# Patient Record
Sex: Female | Born: 1950
Health system: Southern US, Community
[De-identification: ages and names within clinical notes are randomized; demographics above are authoritative.]

## PROBLEM LIST (undated history)

## (undated) DIAGNOSIS — Z923 Personal history of irradiation: Secondary | ICD-10-CM

## (undated) DIAGNOSIS — R112 Nausea with vomiting, unspecified: Secondary | ICD-10-CM

## (undated) DIAGNOSIS — F32A Depression, unspecified: Secondary | ICD-10-CM

## (undated) DIAGNOSIS — C801 Malignant (primary) neoplasm, unspecified: Secondary | ICD-10-CM

## (undated) DIAGNOSIS — R519 Headache, unspecified: Secondary | ICD-10-CM

## (undated) DIAGNOSIS — Z9889 Other specified postprocedural states: Secondary | ICD-10-CM

## (undated) DIAGNOSIS — C50919 Malignant neoplasm of unspecified site of unspecified female breast: Secondary | ICD-10-CM

## (undated) DIAGNOSIS — F419 Anxiety disorder, unspecified: Secondary | ICD-10-CM

## (undated) DIAGNOSIS — R51 Headache: Secondary | ICD-10-CM

## (undated) DIAGNOSIS — E7800 Pure hypercholesterolemia, unspecified: Secondary | ICD-10-CM

## (undated) DIAGNOSIS — F329 Major depressive disorder, single episode, unspecified: Secondary | ICD-10-CM

## (undated) DIAGNOSIS — M1990 Unspecified osteoarthritis, unspecified site: Secondary | ICD-10-CM

## (undated) DIAGNOSIS — T4145XA Adverse effect of unspecified anesthetic, initial encounter: Secondary | ICD-10-CM

## (undated) DIAGNOSIS — T8859XA Other complications of anesthesia, initial encounter: Secondary | ICD-10-CM

## (undated) HISTORY — PX: TONSILLECTOMY AND ADENOIDECTOMY: SHX28

## (undated) HISTORY — DX: Anxiety disorder, unspecified: F41.9

## (undated) HISTORY — PX: MOUTH SURGERY: SHX715

## (undated) HISTORY — DX: Malignant (primary) neoplasm, unspecified: C80.1

## (undated) HISTORY — DX: Depression, unspecified: F32.A

## (undated) HISTORY — PX: ABDOMINAL HYSTERECTOMY: SHX81

## (undated) HISTORY — DX: Major depressive disorder, single episode, unspecified: F32.9

## (undated) HISTORY — DX: Personal history of irradiation: Z92.3

---

## 1996-12-08 HISTORY — PX: TOTAL VAGINAL HYSTERECTOMY: SHX2548

## 1999-11-06 ENCOUNTER — Encounter: Admission: RE | Admit: 1999-11-06 | Discharge: 1999-11-06 | Payer: Self-pay | Admitting: Obstetrics and Gynecology

## 1999-11-06 ENCOUNTER — Encounter: Payer: Self-pay | Admitting: Obstetrics and Gynecology

## 2000-01-26 ENCOUNTER — Other Ambulatory Visit: Admission: RE | Admit: 2000-01-26 | Discharge: 2000-01-26 | Payer: Self-pay | Admitting: Obstetrics and Gynecology

## 2000-11-17 ENCOUNTER — Encounter: Payer: Self-pay | Admitting: Family Medicine

## 2000-11-17 ENCOUNTER — Encounter: Admission: RE | Admit: 2000-11-17 | Discharge: 2000-11-17 | Payer: Self-pay | Admitting: Family Medicine

## 2001-11-21 ENCOUNTER — Encounter: Admission: RE | Admit: 2001-11-21 | Discharge: 2001-11-21 | Payer: Self-pay | Admitting: Obstetrics and Gynecology

## 2001-11-21 ENCOUNTER — Encounter: Payer: Self-pay | Admitting: Obstetrics and Gynecology

## 2002-02-05 ENCOUNTER — Other Ambulatory Visit: Admission: RE | Admit: 2002-02-05 | Discharge: 2002-02-05 | Payer: Self-pay | Admitting: Obstetrics and Gynecology

## 2002-02-16 ENCOUNTER — Ambulatory Visit (HOSPITAL_COMMUNITY): Admission: RE | Admit: 2002-02-16 | Discharge: 2002-02-16 | Payer: Self-pay | Admitting: Gastroenterology

## 2002-11-23 ENCOUNTER — Encounter: Payer: Self-pay | Admitting: Obstetrics and Gynecology

## 2002-11-23 ENCOUNTER — Encounter: Admission: RE | Admit: 2002-11-23 | Discharge: 2002-11-23 | Payer: Self-pay | Admitting: Obstetrics and Gynecology

## 2004-12-18 ENCOUNTER — Encounter: Admission: RE | Admit: 2004-12-18 | Discharge: 2004-12-18 | Payer: Self-pay | Admitting: Obstetrics and Gynecology

## 2005-12-24 ENCOUNTER — Encounter: Admission: RE | Admit: 2005-12-24 | Discharge: 2005-12-24 | Payer: Self-pay | Admitting: Obstetrics and Gynecology

## 2006-08-10 ENCOUNTER — Encounter: Admission: RE | Admit: 2006-08-10 | Discharge: 2006-08-10 | Payer: Self-pay | Admitting: Obstetrics and Gynecology

## 2007-01-20 ENCOUNTER — Encounter: Admission: RE | Admit: 2007-01-20 | Discharge: 2007-01-20 | Payer: Self-pay | Admitting: Obstetrics and Gynecology

## 2008-01-26 ENCOUNTER — Encounter: Admission: RE | Admit: 2008-01-26 | Discharge: 2008-01-26 | Payer: Self-pay | Admitting: Obstetrics and Gynecology

## 2009-01-31 ENCOUNTER — Encounter: Admission: RE | Admit: 2009-01-31 | Discharge: 2009-01-31 | Payer: Self-pay | Admitting: Obstetrics & Gynecology

## 2010-02-06 ENCOUNTER — Encounter: Admission: RE | Admit: 2010-02-06 | Discharge: 2010-02-06 | Payer: Self-pay | Admitting: Obstetrics and Gynecology

## 2010-09-25 NOTE — Op Note (Signed)
[name], Dawn West                          ACCOUNT NO.:  [id number]   MEDICAL RECORD NO.:  [id number]                   PATIENT TYPE:  AMB   LOCATION:  ENDO                                 FACILITY:  [facility]   PHYSICIAN:  Anselmo Rod, M.D.               DATE OF BIRTH:  03-13-1951   DATE OF PROCEDURE:  02/16/2002  DATE OF DISCHARGE:                                 OPERATIVE REPORT   PROCEDURE PERFORMED:  Screening colonoscopy.   ENDOSCOPIST:  Anselmo Rod, M.D.   INSTRUMENT USED:  Olympus video colonoscopy (adjustable pediatric scope).   INDICATIONS FOR PROCEDURE:  60 year old white female underwent screening  colonoscopy to rule out colonic polyps, masses, hemorrhoids, etc.   PREPROCEDURE PREPARATION:  Informed consent was procured from the patient.  The patient fasted for eight hours prior to the procedure and prepped with a  bottle of magnesium citrate and a gallon of NuLytely the night prior to the  procedure.   PREPROCEDURE PHYSICAL:  VITAL SIGNS:  The patient had stable vital signs.  NECK:  Supple.  CHEST:  Clear to auscultation.  HEART:  S1, S2 is regular.  ABDOMEN:  Abdomen is soft with normal bowel sounds.   DESCRIPTION OF PROCEDURE:  The patient was placed in the left lateral  decubitus position, sedated with 60 mg of Demerol and 6 mg of Versed  intravenously.  Once the patient was adequate sedated and maintained on low-  flow oxygen and continuous cardiac monitoring, the Olympus video colonoscope  was advanced from the rectum to the cecum without difficulty.  In fact, all  of the mucosa appeared healthy with a normal vascular pattern.  No mass.  Except for a few early left-sided diverticula, no other abnormalities were  seen.  The procedure was complete up to the cecum.  The appendicular orifice  and ileocecal valve were clearly visualized and photographed.  No masses or  polyps were present.   IMPRESSION:  Normal colonoscopy except for a few early  left-sided  diverticula.   RECOMMENDATIONS:  1. A high-fiber diet has been discussed with the patient in great detail.     Brochures on diverticulosis have been handed to her for further     education.  2.     Repeat colorectal cancer screening will be done in the next ten years unless     the patient develops any abnormal symptoms.  3. Outpatient followup on a p.r.n. basis.                                               Anselmo Rod, M.D.    JNM/MEDQ  D:  02/16/2002  T:  02/17/2002  Job:  [id number]   cc:   Aram Beecham P. Romine, M.D.  [address]  [place]  [place] [address]  Fax: [phone]   Doris Cheadle. Foy Guadalajara, M.D.  [address] [place]  [place] [address]  Fax: [phone]

## 2011-02-03 ENCOUNTER — Other Ambulatory Visit: Payer: Self-pay | Admitting: Obstetrics and Gynecology

## 2011-02-03 DIAGNOSIS — Z1231 Encounter for screening mammogram for malignant neoplasm of breast: Secondary | ICD-10-CM

## 2011-02-19 ENCOUNTER — Ambulatory Visit
Admission: RE | Admit: 2011-02-19 | Discharge: 2011-02-19 | Disposition: A | Discharge status: Home / Self Care | Payer: BC Managed Care – PPO | Source: Ambulatory Visit | Attending: Obstetrics and Gynecology | Admitting: Obstetrics and Gynecology

## 2011-02-19 DIAGNOSIS — Z1231 Encounter for screening mammogram for malignant neoplasm of breast: Secondary | ICD-10-CM

## 2011-05-11 HISTORY — PX: SKIN CANCER EXCISION: SHX779

## 2011-12-01 DIAGNOSIS — C801 Malignant (primary) neoplasm, unspecified: Secondary | ICD-10-CM

## 2011-12-01 HISTORY — DX: Malignant (primary) neoplasm, unspecified: C80.1

## 2012-01-21 ENCOUNTER — Other Ambulatory Visit: Payer: Self-pay | Admitting: Obstetrics and Gynecology

## 2012-01-21 DIAGNOSIS — Z1231 Encounter for screening mammogram for malignant neoplasm of breast: Secondary | ICD-10-CM

## 2012-02-25 ENCOUNTER — Ambulatory Visit
Admission: RE | Admit: 2012-02-25 | Discharge: 2012-02-25 | Disposition: A | Discharge status: Home / Self Care | Payer: BC Managed Care – PPO | Source: Ambulatory Visit | Attending: Obstetrics and Gynecology | Admitting: Obstetrics and Gynecology

## 2012-02-25 DIAGNOSIS — Z1231 Encounter for screening mammogram for malignant neoplasm of breast: Secondary | ICD-10-CM

## 2012-03-31 LAB — HM COLONOSCOPY

## 2013-02-12 ENCOUNTER — Other Ambulatory Visit: Payer: Self-pay

## 2013-02-12 DIAGNOSIS — Z1231 Encounter for screening mammogram for malignant neoplasm of breast: Secondary | ICD-10-CM

## 2013-03-02 ENCOUNTER — Ambulatory Visit
Admission: RE | Admit: 2013-03-02 | Discharge: 2013-03-02 | Disposition: A | Discharge status: Home / Self Care | Payer: BC Managed Care – PPO | Source: Ambulatory Visit

## 2013-03-02 DIAGNOSIS — Z1231 Encounter for screening mammogram for malignant neoplasm of breast: Secondary | ICD-10-CM

## 2013-05-09 ENCOUNTER — Other Ambulatory Visit: Payer: Self-pay | Admitting: Nurse Practitioner

## 2013-05-09 NOTE — Telephone Encounter (Signed)
[facility]Scribe request for refill on SERTRALINE Last filled - 05/08/12 X 1 YEAR Last AEX - 05/08/12 Next AEX - 06/20/13 RX sent until AEX.

## 2013-06-14 ENCOUNTER — Other Ambulatory Visit: Payer: Self-pay | Admitting: *Deleted

## 2013-06-14 NOTE — Telephone Encounter (Signed)
Incoming fax from [facility] requesting Vitamin D 50,000.  Last AEX 05/08/2012 Last refill 05/08/2012 x 1 year Last Vitamin D check 05/08/2012 - Normal  Next appt 06/20/2013  Rx Denied. - Dawn West will probably check Vitamin D at next appt and will decide if pt needs a refill then.

## 2013-06-19 ENCOUNTER — Encounter: Payer: Self-pay | Admitting: Nurse Practitioner

## 2013-06-20 ENCOUNTER — Ambulatory Visit (INDEPENDENT_AMBULATORY_CARE_PROVIDER_SITE_OTHER): Payer: BC Managed Care – PPO | Admitting: Nurse Practitioner

## 2013-06-20 ENCOUNTER — Other Ambulatory Visit: Payer: Self-pay | Admitting: Nurse Practitioner

## 2013-06-20 ENCOUNTER — Encounter: Payer: Self-pay | Admitting: Nurse Practitioner

## 2013-06-20 ENCOUNTER — Telehealth: Payer: Self-pay | Admitting: Nurse Practitioner

## 2013-06-20 ENCOUNTER — Ambulatory Visit
Admission: RE | Admit: 2013-06-20 | Discharge: 2013-06-20 | Disposition: A | Discharge status: Home / Self Care | Payer: BC Managed Care – PPO | Source: Ambulatory Visit | Attending: Nurse Practitioner | Admitting: Nurse Practitioner

## 2013-06-20 VITALS — BP 140/86 | HR 72 | Ht 62.75 in | Wt 152.0 lb

## 2013-06-20 DIAGNOSIS — M7120 Synovial cyst of popliteal space [Baker], unspecified knee: Secondary | ICD-10-CM

## 2013-06-20 DIAGNOSIS — M79661 Pain in right lower leg: Secondary | ICD-10-CM

## 2013-06-20 DIAGNOSIS — E559 Vitamin D deficiency, unspecified: Secondary | ICD-10-CM

## 2013-06-20 DIAGNOSIS — M8580 Other specified disorders of bone density and structure, unspecified site: Secondary | ICD-10-CM

## 2013-06-20 DIAGNOSIS — M949 Disorder of cartilage, unspecified: Secondary | ICD-10-CM

## 2013-06-20 DIAGNOSIS — Z01419 Encounter for gynecological examination (general) (routine) without abnormal findings: Secondary | ICD-10-CM

## 2013-06-20 DIAGNOSIS — M899 Disorder of bone, unspecified: Secondary | ICD-10-CM

## 2013-06-20 DIAGNOSIS — Z0000 Encounter for general adult medical examination without abnormal findings: Secondary | ICD-10-CM

## 2013-06-20 DIAGNOSIS — M79609 Pain in unspecified limb: Secondary | ICD-10-CM

## 2013-06-20 LAB — POCT URINALYSIS DIPSTICK
BILIRUBIN UA: NEGATIVE
GLUCOSE UA: NEGATIVE
Ketones, UA: NEGATIVE
LEUKOCYTES UA: NEGATIVE
NITRITE UA: NEGATIVE
Protein, UA: NEGATIVE
RBC UA: NEGATIVE
UROBILINOGEN UA: NEGATIVE
pH, UA: 6

## 2013-06-20 LAB — HEMOGLOBIN, FINGERSTICK: HEMOGLOBIN, FINGERSTICK: 14.2 g/dL (ref 12.0–16.0)

## 2013-06-20 MED ORDER — ESTRADIOL 0.5 MG PO TABS: 0.5000 mg | ORAL_TABLET | Freq: Every day | ORAL | Status: DC

## 2013-06-20 MED ORDER — VITAMIN D (ERGOCALCIFEROL) 1.25 MG (50000 UNIT) PO CAPS: 50000.0000 [IU] | ORAL_CAPSULE | ORAL | Status: DC

## 2013-06-20 MED ORDER — SERTRALINE HCL 100 MG PO TABS: ORAL_TABLET | ORAL | Status: DC

## 2013-06-20 NOTE — Progress Notes (Signed)
Patient scheduled for 1500 RLE U/S to r/o DVT at [facility] for today at [address].

## 2013-06-20 NOTE — Progress Notes (Signed)
Patient ID: Dawn West, female   DOB: 1951/01/19, 63 y.o.   MRN: [id number] 63 y.o. [id number] Married Caucasian Fe here for annual exam. Her main concern is with right leg pain since January.  No history of trauma, but she thinks is related to going up and down stars to put away Christmas decoration. Yesterday was quite painful after driving her neighbor to the store, movies, and out to eat.  No swelling, redness, or warmth.  At times the right knee is also painful. Husband will be having surgery for penile implant this month.  Since his diabetes he is having a lot of difficulty with ED.  No LMP recorded. Patient is postmenopausal.          Sexually active: yes  The current method of family planning is post menopausal status.    Exercising: yes  walking Smoker:  no  Health Maintenance: Pap:  03/28/09, WNL (TVH) MMG:  03/02/13, Bi-Rads 1: Negative Colonoscopy:  03/31/12, polyp, diverticula, repeat 5 years BMD:  01/2010, -0.9/-1.4 TDaP:  2012 Labs:  HB:  14.2  Urine:  Negative, pH 6.0   reports that she has never smoked. She does not have any smokeless tobacco history on file.  Past Medical History  Diagnosis Date  . Cancer 12/01/11    basal cell skin ca Left upper arm    Past Surgical History  Procedure Laterality Date  . Skin cancer excision Left 2013    upper arm, basal cell  . Tonsillectomy and adenoidectomy    . Mouth surgery    . Total vaginal hysterectomy      menorrhagia, fibroids    Current Outpatient Prescriptions  Medication Sig Dispense Refill  . aspirin EC 81 MG tablet Take 81 mg by mouth daily.      . Calcium Carb-Cholecalciferol (CALCIUM 600 + D) 600-200 MG-UNIT TABS Take 1 tablet by mouth daily.      . cetirizine (ZYRTEC) 10 MG tablet Take 10 mg by mouth daily.      Marland Kitchen estradiol (ESTRACE) 0.5 MG tablet Take 0.5 mg by mouth daily.      . Magnesium 250 MG TABS Take by mouth.      . Multiple Vitamin (MULTIVITAMIN) tablet Take 1 tablet by mouth daily.      . Omega-3  Fatty Acids (FISH OIL PO) Take by mouth.      . pravastatin (PRAVACHOL) 40 MG tablet Take 40 mg by mouth daily.      . sertraline (ZOLOFT) 100 MG tablet Take one tablet by mouth one time daily  90 tablet  3  . Vitamin D, Ergocalciferol, (DRISDOL) 50000 UNITS CAPS capsule Take 1 capsule (50,000 Units total) by mouth every 14 (fourteen) days.  30 capsule  3  . vitamin E 400 UNIT capsule Take 400 Units by mouth daily.      . fluticasone (FLONASE) 50 MCG/ACT nasal spray as needed.       No current facility-administered medications for this visit.    Family History  Problem Relation Age of Onset  . Hypertension Mother   . Heart failure Mother   . Diabetes Mother   . Stroke Mother   . COPD Mother   . Hyperlipidemia Mother   . Hypertension Father   . Heart failure Father   . Hypertension Brother   . Hypertension Sister   . Hyperlipidemia Sister   . Hypertension Brother   . Hypertension Brother   . Diabetes Brother     ROS:  Pertinent  items are noted in HPI.  Otherwise, a comprehensive ROS was negative.  Exam:   BP 140/86  Pulse 72  Ht 5' 2.75" (1.594 m)  Wt 152 lb (68.947 kg)  BMI 27.14 kg/m2 Height: 5' 2.75" (159.4 cm)  Ht Readings from Last 3 Encounters:  06/20/13 5' 2.75" (1.594 m)    General appearance: alert, cooperative and appears stated age Head: Normocephalic, without obvious abnormality, atraumatic Neck: no adenopathy, supple, symmetrical, trachea midline and thyroid normal to inspection and palpation Lungs: clear to auscultation bilaterally Breasts: normal appearance, no masses or tenderness Heart: regular rate and rhythm Abdomen: soft, non-tender; no masses,  no organomegaly Extremities: extremities normal, atraumatic, no cyanosis or edema Skin: Skin color, texture, turgor normal. No rashes or lesions Lymph nodes: Cervical, supraclavicular, and axillary nodes normal. No abnormal inguinal nodes palpated Neurologic: Grossly normal   Pelvic: External genitalia:   no lesions              Urethra:  normal appearing urethra with no masses, tenderness or lesions              Bartholin's and Skene's: normal                 Vagina: normal appearing vagina with normal color and discharge, no lesions              Cervix: absent              Pap taken: no Bimanual Exam:  Uterus:  uterus absent              Adnexa: no mass, fullness, tenderness               Rectovaginal: Confirms               Anus:  normal sphincter tone, no lesions  A:  Well Woman with normal exam  S/P TVH secondary to fibroids 8/98 on ERT  Right leg pain - R/O DVT since she is on ERT  Atrophic vaginitis   Vit D deficiency  osteopenia   P:   Pap smear as per guidelines   Mammogram is due 02/2014  Will repeat BMD at that time - order is placed  Will get a doppler of right leg and follow - no refill of ERT until we make sure no DVT  Refill of ERT was placed after results of doppler studies - see note below.    She  Is counseled about ERT and potential risk including DVT, CVA, cancer, etc.  Refill on Vit D and will follow with labs    Counseled on breast self exam, mammography screening, adequate intake of calcium and vitamin D, diet and exercise return annually or prn  An After Visit Summary was printed and given to the patient.  5:00 pm Patient  had doppler test of right lower extremity and has no DVT but she does have a Bakers Cyst behind the right knee.  She will be referred to Ortho.

## 2013-06-20 NOTE — Telephone Encounter (Signed)
Spoke with Milford Cage, FNP, okay to give results and referral to [facility].

## 2013-06-20 NOTE — Telephone Encounter (Signed)
[facility] calling with results of rt lower extremity venous doppler. No evidence of lower extremity dvt and probable small baker's cyst.

## 2013-06-20 NOTE — Patient Instructions (Signed)

## 2013-06-21 LAB — VITAMIN D 25 HYDROXY (VIT D DEFICIENCY, FRACTURES): Vit D, 25-Hydroxy: 54 ng/mL (ref 30–89)

## 2013-06-21 NOTE — Telephone Encounter (Signed)
Spoke with patient. Advised has ortho appointment with Dr. Drema Dallas at [facility] for 2/18 at 0830 and given address. Agreeable to time/date/location. Will fax records to Dr. Drema Dallas at [phone].  Lab results given as well. Has rx vitamin D and will continue as ordered.

## 2013-06-21 NOTE — Telephone Encounter (Signed)
Message copied by Michele Mcalpine on Thu Jun 21, 2013  9:26 AM ------      Message from: Kem Boroughs R      Created: Thu Jun 21, 2013  8:37 AM       Let patient know Vit D results and follow protocol ------

## 2013-06-22 NOTE — Progress Notes (Signed)
Encounter reviewed by Dr. Brook Silva.  

## 2014-02-27 ENCOUNTER — Other Ambulatory Visit: Payer: Self-pay

## 2014-02-27 DIAGNOSIS — Z1231 Encounter for screening mammogram for malignant neoplasm of breast: Secondary | ICD-10-CM

## 2014-03-11 ENCOUNTER — Encounter: Payer: Self-pay | Admitting: Nurse Practitioner

## 2014-03-15 ENCOUNTER — Other Ambulatory Visit: Payer: BC Managed Care – PPO

## 2014-03-15 ENCOUNTER — Ambulatory Visit: Payer: BC Managed Care – PPO

## 2014-03-22 ENCOUNTER — Ambulatory Visit
Admission: RE | Admit: 2014-03-22 | Discharge: 2014-03-22 | Disposition: A | Discharge status: Home / Self Care | Payer: BC Managed Care – PPO | Source: Ambulatory Visit

## 2014-03-22 ENCOUNTER — Encounter (INDEPENDENT_AMBULATORY_CARE_PROVIDER_SITE_OTHER): Payer: Self-pay

## 2014-03-22 ENCOUNTER — Ambulatory Visit
Admission: RE | Admit: 2014-03-22 | Discharge: 2014-03-22 | Disposition: A | Discharge status: Home / Self Care | Payer: BC Managed Care – PPO | Source: Ambulatory Visit | Attending: Nurse Practitioner | Admitting: Nurse Practitioner

## 2014-03-22 DIAGNOSIS — M8580 Other specified disorders of bone density and structure, unspecified site: Secondary | ICD-10-CM

## 2014-03-22 DIAGNOSIS — Z1231 Encounter for screening mammogram for malignant neoplasm of breast: Secondary | ICD-10-CM

## 2014-06-20 ENCOUNTER — Other Ambulatory Visit: Payer: Self-pay | Admitting: Nurse Practitioner

## 2014-06-21 NOTE — Telephone Encounter (Signed)
Medication refill request: Zoloft 100 mg and Vitamin D 50,000 Last AEX:  06/20/13 Next AEX: 06/28/14 Last MMG (if hormonal medication request): 03/22/14 BIRADS1:neg Refill authorized: Zoloft #90/3R. Vitamin D 06/20/13 #30/3R.  Today #46month / 0 R?

## 2014-06-21 NOTE — Telephone Encounter (Signed)
Will refill Zoloft, but will hold on Vitamin D until appointment

## 2014-06-28 ENCOUNTER — Encounter: Payer: Self-pay | Admitting: Nurse Practitioner

## 2014-06-28 ENCOUNTER — Ambulatory Visit (INDEPENDENT_AMBULATORY_CARE_PROVIDER_SITE_OTHER): Payer: 59 | Admitting: Nurse Practitioner

## 2014-06-28 VITALS — BP 140/84 | HR 64 | Ht 63.0 in | Wt 155.0 lb

## 2014-06-28 DIAGNOSIS — Z0000 Encounter for general adult medical examination without abnormal findings: Secondary | ICD-10-CM | POA: Diagnosis not present

## 2014-06-28 DIAGNOSIS — Z01419 Encounter for gynecological examination (general) (routine) without abnormal findings: Secondary | ICD-10-CM

## 2014-06-28 DIAGNOSIS — E559 Vitamin D deficiency, unspecified: Secondary | ICD-10-CM

## 2014-06-28 LAB — POCT URINALYSIS DIPSTICK
Bilirubin, UA: NEGATIVE
Glucose, UA: NEGATIVE
Ketones, UA: NEGATIVE
LEUKOCYTES UA: NEGATIVE
NITRITE UA: NEGATIVE
PH UA: 7
Protein, UA: NEGATIVE
RBC UA: NEGATIVE
Urobilinogen, UA: NEGATIVE

## 2014-06-28 MED ORDER — VITAMIN D (ERGOCALCIFEROL) 1.25 MG (50000 UNIT) PO CAPS: 50000.0000 [IU] | ORAL_CAPSULE | ORAL | Status: DC

## 2014-06-28 MED ORDER — ESTRADIOL 0.5 MG PO TABS: 0.5000 mg | ORAL_TABLET | Freq: Every day | ORAL | Status: DC

## 2014-06-28 MED ORDER — SERTRALINE HCL 100 MG PO TABS: 100.0000 mg | ORAL_TABLET | Freq: Every day | ORAL | Status: DC

## 2014-06-28 NOTE — Progress Notes (Signed)
Patient ID: Dawn West, female   DOB: January 10, 1951, 64 y.o.   MRN: [id number] 64 y.o. [id number] Married  Caucasian Fe here for annual exam.  Patient is not doing well in regards to right knee pain.  She has been seeing Ortho but still having a lot of pain despite injections for the inflammation.  She feels very limited to what she can do and how long she can stand to do regular chores.  Patient's last menstrual period was 12/12/1996 (exact date).          Sexually active: Yes.    The current method of family planning is status post hysterectomy.    Exercising: Yes.    walking Smoker:  no  Health Maintenance: Pap:  03/28/09 negative (TVH) MMG:  03/22/14, Bi-Rads 1:  negative Colonoscopy:  03/31/12, polyp, diverticula, repeat in 5 years BMD:   03/22/14, T-Score -0.3S/-1.2L TDaP:  2012 Labs:  HB:  14.1  Urine:  Negative    reports that she has never smoked. She does not have any smokeless tobacco history on file.  Past Medical History  Diagnosis Date  . Cancer 12/01/11    basal cell skin ca Left upper arm    Past Surgical History  Procedure Laterality Date  . Skin cancer excision Left 2013    upper arm, basal cell  . Tonsillectomy and adenoidectomy    . Mouth surgery    . Total vaginal hysterectomy      menorrhagia, fibroids    Current Outpatient Prescriptions  Medication Sig Dispense Refill  . aspirin EC 81 MG tablet Take 81 mg by mouth daily.    . Calcium Carb-Cholecalciferol (CALCIUM 600 + D) 600-200 MG-UNIT TABS Take 1 tablet by mouth daily.    . cetirizine (ZYRTEC) 10 MG tablet Take 10 mg by mouth daily.    Marland Kitchen estradiol (ESTRACE) 0.5 MG tablet Take 1 tablet (0.5 mg total) by mouth daily. 90 tablet 3  . fluticasone (FLONASE) 50 MCG/ACT nasal spray as needed.    . Magnesium 250 MG TABS Take by mouth.    . Multiple Vitamin (MULTIVITAMIN) tablet Take 1 tablet by mouth daily.    . Omega-3 Fatty Acids (FISH OIL PO) Take by mouth.    . pravastatin (PRAVACHOL) 40 MG tablet Take 40  mg by mouth daily.    . sertraline (ZOLOFT) 100 MG tablet Take 1 tablet (100 mg total) by mouth daily. 90 tablet 3  . Vitamin D, Ergocalciferol, (DRISDOL) 50000 UNITS CAPS capsule Take 1 capsule (50,000 Units total) by mouth every 7 (seven) days. 30 capsule 3  . vitamin E 400 UNIT capsule Take 400 Units by mouth daily.     No current facility-administered medications for this visit.    Family History  Problem Relation Age of Onset  . Hypertension Mother   . Heart failure Mother   . Diabetes Mother   . Stroke Mother   . COPD Mother   . Hyperlipidemia Mother   . Hypertension Father   . Heart failure Father   . Hypertension Brother   . Hypertension Sister   . Hyperlipidemia Sister   . Hypertension Brother   . Hypertension Brother   . Diabetes Brother     ROS:  Pertinent items are noted in HPI.  Otherwise, a comprehensive ROS was negative.  Exam:   BP 140/84 mmHg  Pulse 64  Ht 5\' 3"  (1.6 m)  Wt 155 lb (70.308 kg)  BMI 27.46 kg/m2  LMP 12/12/1996 (Exact Date) Height:  5\' 3"  (160 cm) Ht Readings from Last 3 Encounters:  06/28/14 5\' 3"  (1.6 m)  06/20/13 5' 2.75" (1.594 m)    General appearance: alert, cooperative and appears stated age Head: Normocephalic, without obvious abnormality, atraumatic Neck: no adenopathy, supple, symmetrical, trachea midline and thyroid normal to inspection and palpation Lungs: clear to auscultation bilaterally Breasts: normal appearance, no masses or tenderness Heart: regular rate and rhythm Abdomen: soft, non-tender; no masses,  no organomegaly Extremities: extremities normal, atraumatic, no cyanosis or edema Skin: Skin color, texture, turgor normal. No rashes or lesions Lymph nodes: Cervical, supraclavicular, and axillary nodes normal. No abnormal inguinal nodes palpated Neurologic: Grossly normal   Pelvic: External genitalia:  no lesions              Urethra:  normal appearing urethra with no masses, tenderness or lesions               Bartholin's and Skene's: normal                 Vagina: normal appearing vagina with normal color and discharge, no lesions              Cervix: absent              Pap taken: No. Bimanual Exam:  Uterus:  uterus absent              Adnexa: no mass, fullness, tenderness               Rectovaginal: Confirms               Anus:  normal sphincter tone, no lesions  Chaperone present:  no  A:  Well Woman with normal exam  S/P TVH secondary to fibroids 8/98 on ERT Right knee pain  Atrophic vaginitis  Vit D deficiency Osteopenia  Situational anxiety and depression   P:   Reviewed health and wellness pertinent to exam  Pap smear not taken today  Mammogram is due 11/16  Follow up on CBC and Vit D  Refill on Estrace 0.5 mg daily for a year  Counseled with risk of CVA, DVT, cancer, etc  Refill on Zoloft daily for a year  Counseled on breast self exam, mammography screening, adequate intake of calcium and vitamin D, diet and exercise return annually or prn  An After Visit Summary was printed and given to the patient.

## 2014-06-28 NOTE — Patient Instructions (Signed)

## 2014-06-29 LAB — VITAMIN D 25 HYDROXY (VIT D DEFICIENCY, FRACTURES): Vit D, 25-Hydroxy: 35 ng/mL (ref 30–100)

## 2014-07-01 LAB — CBC WITH DIFFERENTIAL/PLATELET

## 2014-07-01 LAB — HEMOGLOBIN, FINGERSTICK: Hemoglobin, fingerstick: 14.1 g/dL (ref 12.0–16.0)

## 2014-07-02 NOTE — Progress Notes (Signed)
Encounter reviewed by Dr. Brook Silva.  

## 2014-07-04 NOTE — Addendum Note (Signed)
Addended by: Graylon Good on: 07/04/2014 04:18 PM   Modules accepted: Orders, SmartSet

## 2014-07-12 ENCOUNTER — Other Ambulatory Visit (INDEPENDENT_AMBULATORY_CARE_PROVIDER_SITE_OTHER): Payer: 59

## 2014-07-12 DIAGNOSIS — Z0000 Encounter for general adult medical examination without abnormal findings: Secondary | ICD-10-CM

## 2014-07-12 LAB — CBC WITH DIFFERENTIAL/PLATELET
BASOS PCT: 1 % (ref 0–1)
Basophils Absolute: 0 10*3/uL (ref 0.0–0.1)
EOS ABS: 0.2 10*3/uL (ref 0.0–0.7)
Eosinophils Relative: 5 % (ref 0–5)
HEMATOCRIT: 40.3 % (ref 36.0–46.0)
Hemoglobin: 13.6 g/dL (ref 12.0–15.0)
Lymphocytes Relative: 35 % (ref 12–46)
Lymphs Abs: 1.6 10*3/uL (ref 0.7–4.0)
MCH: 29.6 pg (ref 26.0–34.0)
MCHC: 33.7 g/dL (ref 30.0–36.0)
MCV: 87.8 fL (ref 78.0–100.0)
MONO ABS: 0.3 10*3/uL (ref 0.1–1.0)
MPV: 9 fL (ref 8.6–12.4)
Monocytes Relative: 6 % (ref 3–12)
NEUTROS PCT: 53 % (ref 43–77)
Neutro Abs: 2.4 10*3/uL (ref 1.7–7.7)
PLATELETS: 294 10*3/uL (ref 150–400)
RBC: 4.59 MIL/uL (ref 3.87–5.11)
RDW: 13.3 % (ref 11.5–15.5)
WBC: 4.5 10*3/uL (ref 4.0–10.5)

## 2014-11-04 ENCOUNTER — Other Ambulatory Visit: Payer: Self-pay | Admitting: Nurse Practitioner

## 2014-11-04 NOTE — Telephone Encounter (Signed)
06/28/14 #90/3 rfs was sent to [place] calling patient to see if patient wants this rx sent to [facility] got a message that says "The person you are trying to reach has a voicemail box that has not been set up yet, please try again later".

## 2014-11-06 MED ORDER — VITAMIN D (ERGOCALCIFEROL) 1.25 MG (50000 UNIT) PO CAPS: 50000.0000 [IU] | ORAL_CAPSULE | ORAL | Status: DC

## 2014-11-06 MED ORDER — SERTRALINE HCL 100 MG PO TABS: 100.0000 mg | ORAL_TABLET | Freq: Every day | ORAL | Status: DC

## 2014-11-06 NOTE — Telephone Encounter (Signed)
Called and s/w patient she said her insurance does not cover her rx's through [facility] and she needs her Zoloft, estradiol and Vitamin d sent to [facility].   Estradiol 0.5 mg #90/2 rfs Zoloft 100 mg #90/2 rfs  Vitamin D 50,000 iu's #30/2 rfs Sent to [facility] , patient is aware.

## 2015-02-28 ENCOUNTER — Other Ambulatory Visit: Payer: Self-pay

## 2015-02-28 DIAGNOSIS — Z1231 Encounter for screening mammogram for malignant neoplasm of breast: Secondary | ICD-10-CM

## 2015-03-28 ENCOUNTER — Ambulatory Visit
Admission: RE | Admit: 2015-03-28 | Discharge: 2015-03-28 | Disposition: A | Discharge status: Home / Self Care | Payer: 59 | Source: Ambulatory Visit

## 2015-03-28 DIAGNOSIS — Z1231 Encounter for screening mammogram for malignant neoplasm of breast: Secondary | ICD-10-CM

## 2015-07-04 ENCOUNTER — Ambulatory Visit: Payer: 59 | Admitting: Nurse Practitioner

## 2015-07-10 ENCOUNTER — Other Ambulatory Visit: Payer: Self-pay | Admitting: Nurse Practitioner

## 2015-07-10 NOTE — Telephone Encounter (Signed)
Medication refill request: estradiol 0.5mg  Last AEX:  06/28/14 Next AEX: 09/05/15 Last MMG (if hormonal medication request): 03/28/15- Category B Bi-Rads 1 Neg Refill authorized: Please advise on approval or denial of refill request. Thank You.

## 2015-07-10 NOTE — Telephone Encounter (Signed)
Will refill until aex only

## 2015-07-21 ENCOUNTER — Other Ambulatory Visit: Payer: Self-pay | Admitting: Nurse Practitioner

## 2015-07-22 NOTE — Telephone Encounter (Signed)
Medication refill request: sertraline 100mg  Last AEX:  06-28-14 Next AEX: 09-12-15 Last MMG (if hormonal medication request): 03-28-15 Category B Bi-Rads 1 Neg Refill authorized: Please approve or deny refill request. Thank you.

## 2015-09-05 ENCOUNTER — Ambulatory Visit: Payer: Self-pay | Admitting: Nurse Practitioner

## 2015-09-11 ENCOUNTER — Telehealth: Payer: Self-pay | Admitting: Nurse Practitioner

## 2015-09-11 NOTE — Telephone Encounter (Signed)
Left patient a message to call back to reschedule a future appointment that was cancelled by the provider. °

## 2015-09-12 ENCOUNTER — Ambulatory Visit: Payer: Self-pay | Admitting: Nurse Practitioner

## 2015-09-19 ENCOUNTER — Ambulatory Visit (INDEPENDENT_AMBULATORY_CARE_PROVIDER_SITE_OTHER): Payer: Medicare Other | Admitting: Nurse Practitioner

## 2015-09-19 ENCOUNTER — Encounter: Payer: Self-pay | Admitting: Nurse Practitioner

## 2015-09-19 VITALS — BP 122/84 | HR 64 | Ht 62.75 in | Wt 155.0 lb

## 2015-09-19 DIAGNOSIS — Z01419 Encounter for gynecological examination (general) (routine) without abnormal findings: Secondary | ICD-10-CM | POA: Diagnosis not present

## 2015-09-19 DIAGNOSIS — E7800 Pure hypercholesterolemia, unspecified: Secondary | ICD-10-CM

## 2015-09-19 DIAGNOSIS — E559 Vitamin D deficiency, unspecified: Secondary | ICD-10-CM

## 2015-09-19 DIAGNOSIS — Z124 Encounter for screening for malignant neoplasm of cervix: Secondary | ICD-10-CM

## 2015-09-19 DIAGNOSIS — Z20828 Contact with and (suspected) exposure to other viral communicable diseases: Secondary | ICD-10-CM | POA: Diagnosis not present

## 2015-09-19 DIAGNOSIS — Z0000 Encounter for general adult medical examination without abnormal findings: Secondary | ICD-10-CM | POA: Diagnosis not present

## 2015-09-19 DIAGNOSIS — Z205 Contact with and (suspected) exposure to viral hepatitis: Secondary | ICD-10-CM | POA: Diagnosis not present

## 2015-09-19 LAB — COMPREHENSIVE METABOLIC PANEL
ALK PHOS: 53 U/L (ref 33–130)
ALT: 17 U/L (ref 6–29)
AST: 16 U/L (ref 10–35)
Albumin: 4.5 g/dL (ref 3.6–5.1)
BILIRUBIN TOTAL: 0.4 mg/dL (ref 0.2–1.2)
BUN: 17 mg/dL (ref 7–25)
CO2: 27 mmol/L (ref 20–31)
CREATININE: 0.66 mg/dL (ref 0.50–0.99)
Calcium: 9.2 mg/dL (ref 8.6–10.4)
Chloride: 102 mmol/L (ref 98–110)
GLUCOSE: 92 mg/dL (ref 65–99)
Potassium: 4.4 mmol/L (ref 3.5–5.3)
SODIUM: 140 mmol/L (ref 135–146)
Total Protein: 6.7 g/dL (ref 6.1–8.1)

## 2015-09-19 LAB — LIPID PANEL
Cholesterol: 214 mg/dL — ABNORMAL HIGH (ref 125–200)
HDL: 66 mg/dL (ref >=46)
LDL Cholesterol: 118 mg/dL (ref <130)
TRIGLYCERIDES: 152 mg/dL — AB (ref <150)
Total CHOL/HDL Ratio: 3.2 Ratio (ref <=5.0)
VLDL: 30 mg/dL (ref <30)

## 2015-09-19 LAB — HIV ANTIBODY (ROUTINE TESTING W REFLEX): HIV 1&2 Ab, 4th Generation: NONREACTIVE

## 2015-09-19 MED ORDER — VITAMIN D (ERGOCALCIFEROL) 1.25 MG (50000 UNIT) PO CAPS: 50000.0000 [IU] | ORAL_CAPSULE | ORAL | Status: DC

## 2015-09-19 MED ORDER — SERTRALINE HCL 100 MG PO TABS: ORAL_TABLET | ORAL | Status: DC

## 2015-09-19 MED ORDER — ESTRADIOL 0.5 MG PO TABS: 0.5000 mg | ORAL_TABLET | Freq: Every day | ORAL | Status: DC

## 2015-09-19 NOTE — Progress Notes (Signed)
Patient ID: [name], female   DOB: 1950/09/10, 65 y.o.   MRN: [id number]  65 y.o. G23P1001 Married  Caucasian Fe here for annual exam.  No new health problems except for continued pain right knee.  She has had laser and ultrasound treatments and still hurts.  Now the left knee also in pain.  She still works only part time and able to do most things.  She is now on Medicare and wants to get more evaluation for her knee.  Plans for visit there this summer.  She is OK with decreasing ERT - but is very fearful of increase in vaso symptoms.  This has been her biggest reason to stay on estrogen all this time.  Will try the taper in the fall and see how she does.  Instructions are given.  The Zoloft is also helping her with vaso symptoms. She has a granddaughter now about 7 months old.   Patient's last menstrual period was 12/12/1996 (exact date).          Sexually active: Yes.    The current method of family planning is post menopausal status and hysterectomy.    Exercising: Yes.  Walking 2-3 times per week, climbs stairs at home, cleaning house for 6 hours per week Smoker:  no  Health Maintenance: Pap:  03/28/09, WNL (TVH) MMG: 03/28/15, Bi-Rads 1: Negative Colonoscopy:  03/31/12, polyp, diverticula, repeat 5 years BMD: 03/22/14 T Score, -0.3 Spine / -1.2 Left Femur Neck TDaP:  2012 Shingles: 10/2014 Pneumonia: due Hep C and HIV: will do today Labs: wants fasting labs today  Urine: negative   reports that she has never smoked. She has never used smokeless tobacco. She reports that she drinks alcohol. She reports that she does not use illicit drugs.  Past Medical History  Diagnosis Date  . Cancer ([place]) 12/01/11    basal cell skin ca Left upper arm    Past Surgical History  Procedure Laterality Date  . Skin cancer excision Left 2013    upper arm, basal cell  . Tonsillectomy and adenoidectomy    . Mouth surgery    . Total vaginal hysterectomy  12/1996    menorrhagia, fibroids     Current Outpatient Prescriptions  Medication Sig Dispense Refill  . aspirin EC 81 MG tablet Take 81 mg by mouth daily.    . Calcium Carb-Cholecalciferol (CALCIUM 600 + D) 600-200 MG-UNIT TABS Take 1 tablet by mouth daily.    . cetirizine (ZYRTEC) 10 MG tablet Take 10 mg by mouth daily.    Marland Kitchen estradiol (ESTRACE) 0.5 MG tablet Take 1 tablet (0.5 mg total) by mouth daily. 90 tablet 3  . fluticasone (FLONASE) 50 MCG/ACT nasal spray as needed.    . Magnesium 250 MG TABS Take by mouth.    . Multiple Vitamin (MULTIVITAMIN) tablet Take 1 tablet by mouth daily.    . Omega-3 Fatty Acids (FISH OIL PO) Take by mouth.    . pravastatin (PRAVACHOL) 40 MG tablet Take 40 mg by mouth daily.    . sertraline (ZOLOFT) 100 MG tablet Take 1 tablet (100 mg total) by mouth daily. 90 tablet 3  . Vitamin D, Ergocalciferol, (DRISDOL) 50000 units CAPS capsule Take 1 capsule (50,000 Units total) by mouth every 7 (seven) days. 30 capsule 2  . vitamin E 400 UNIT capsule Take 400 Units by mouth daily.     No current facility-administered medications for this visit.    Family History  Problem Relation Age of Onset  .  Hypertension Mother   . Heart failure Mother   . Diabetes Mother   . Stroke Mother   . COPD Mother   . Hyperlipidemia Mother   . Hypertension Father   . Heart failure Father   . Hypertension Brother   . Hypertension Sister   . Hyperlipidemia Sister   . Hypertension Brother   . Hypertension Brother   . Diabetes Brother     ROS:  Pertinent items are noted in HPI.  Otherwise, a comprehensive ROS was negative.  Exam:   BP 122/84 mmHg  Pulse 64  Ht 5' 2.75" (1.594 m)  Wt 155 lb (70.308 kg)  BMI 27.67 kg/m2  LMP 12/12/1996 (Exact Date) Height: 5' 2.75" (159.4 cm) Ht Readings from Last 3 Encounters:  09/19/15 5' 2.75" (1.594 m)  06/28/14 5\' 3"  (1.6 m)  06/20/13 5' 2.75" (1.594 m)    General appearance: alert, cooperative and appears stated age Head: Normocephalic, without obvious  abnormality, atraumatic Neck: no adenopathy, supple, symmetrical, trachea midline and thyroid normal to inspection and palpation Lungs: clear to auscultation bilaterally Breasts: normal appearance, no masses or tenderness Heart: regular rate and rhythm Abdomen: soft, non-tender; no masses,  no organomegaly Extremities: extremities normal, atraumatic, no cyanosis or edema Skin: Skin color, texture, turgor normal. No rashes or lesions Lymph nodes: Cervical, supraclavicular, and axillary nodes normal. No abnormal inguinal nodes palpated Neurologic: Grossly normal   Pelvic: External genitalia:  no lesions              Urethra:  normal appearing urethra with no masses, tenderness or lesions              Bartholin's and Skene's: normal                 Vagina: normal appearing vagina with normal color and discharge, no lesions              Cervix: absent              Pap taken: No. Bimanual Exam:  Uterus:  uterus absent              Adnexa: no mass, fullness, tenderness               Rectovaginal: Confirms               Anus:  normal sphincter tone, no lesions  Chaperone present: no  A:  Well Woman with normal exam  S/P TVH secondary to fibroids 8/98 on ERT Right knee pain  Atrophic vaginitis  Vit D deficiency Osteopenia, hypercholesterolemia Situational anxiety and depression   P:   Reviewed health and wellness pertinent to exam  Pap smear as above  Mammogram is due 11/17  Refill Estrace 0.5 mg for a year  Refill Zoloft 100 mg for a year  Refill on Vit D for a year  Follow with labs   Counseled on breast self exam, mammography screening, use and side effects of HRT, adequate intake of calcium and vitamin D, diet and exercise return annually or prn  An After Visit Summary was printed and given to the patient.

## 2015-09-19 NOTE — Patient Instructions (Addendum)
EXERCISE AND DIET:  We recommended that you start or continue a regular exercise program for good health. Regular exercise means any activity that makes your heart beat faster and makes you sweat.  We recommend exercising at least 30 minutes per day at least 3 days a week, preferably 4 or 5.  We also recommend a diet low in fat and sugar.  Inactivity, poor dietary choices and obesity can cause diabetes, heart attack, stroke, and kidney damage, among others.    ALCOHOL AND SMOKING:  Women should limit their alcohol intake to no more than 7 drinks/beers/glasses of wine (combined, not each!) per week. Moderation of alcohol intake to this level decreases your risk of breast cancer and liver damage. And of course, no recreational drugs are part of a healthy lifestyle.  And absolutely no smoking or even second hand smoke. Most people know smoking can cause heart and lung diseases, but did you know it also contributes to weakening of your bones? Aging of your skin?  Yellowing of your teeth and nails?  CALCIUM AND VITAMIN D:  Adequate intake of calcium and Vitamin D are recommended.  The recommendations for exact amounts of these supplements seem to change often, but generally speaking 600 mg of calcium (either carbonate or citrate) and 800 units of Vitamin D per day seems prudent. Certain women may benefit from higher intake of Vitamin D.  If you are among these women, your doctor will have told you during your visit.    PAP SMEARS:  Pap smears, to check for cervical cancer or precancers,  have traditionally been done yearly, although recent scientific advances have shown that most women can have pap smears less often.  However, every woman still should have a physical exam from her gynecologist every year. It will include a breast check, inspection of the vulva and vagina to check for abnormal growths or skin changes, a visual exam of the cervix, and then an exam to evaluate the size and shape of the uterus and  ovaries.  And after 65 years of age, a rectal exam is indicated to check for rectal cancers. We will also provide age appropriate advice regarding health maintenance, like when you should have certain vaccines, screening for sexually transmitted diseases, bone density testing, colonoscopy, mammograms, etc.   MAMMOGRAMS:  All women over 40 years old should have a yearly mammogram. Many facilities now offer a "3D" mammogram, which may cost around $50 extra out of pocket. If possible,  we recommend you accept the option to have the 3D mammogram performed.  It both reduces the number of women who will be called back for extra views which then turn out to be normal, and it is better than the routine mammogram at detecting truly abnormal areas.    COLONOSCOPY:  Colonoscopy to screen for colon cancer is recommended for all women at age 50.  We know, you hate the idea of the prep.  We agree, BUT, having colon cancer and not knowing it is worse!!  Colon cancer so often starts as a polyp that can be seen and removed at colonscopy, which can quite literally save your life!  And if your first colonoscopy is normal and you have no family history of colon cancer, most women don't have to have it again for 10 years.  Once every ten years, you can do something that may end up saving your life, right?  We will be happy to help you get it scheduled when you are ready.    Be sure to check your insurance coverage so you understand how much it will cost.  It may be covered as a preventative service at no cost, but you should check your particular policy.    In the fall decrease Estrogen to 1/2 tablet alternating with a whole tablet every other day for a month.  If tolerated well then decrease to 1/2 tablet daily until next annual. If you have surgery then come off estrogen about a week before  Get Prevnar 13 - pneumonia shot at PCP in June Show PCP your labs.  Medication refills are sent to [facility] in [place] in the fall Colonoscopy is due 03/2017

## 2015-09-20 LAB — HEPATITIS C ANTIBODY: HCV AB: NEGATIVE

## 2015-09-20 LAB — VITAMIN D 25 HYDROXY (VIT D DEFICIENCY, FRACTURES): VIT D 25 HYDROXY: 71 ng/mL (ref 30–100)

## 2015-09-21 NOTE — Progress Notes (Signed)
Encounter reviewed by Dr. Pari Lombard Amundson C. Silva.  

## 2015-09-22 DIAGNOSIS — M1711 Unilateral primary osteoarthritis, right knee: Secondary | ICD-10-CM | POA: Diagnosis not present

## 2015-09-22 DIAGNOSIS — M25561 Pain in right knee: Secondary | ICD-10-CM | POA: Diagnosis not present

## 2015-09-22 DIAGNOSIS — G8929 Other chronic pain: Secondary | ICD-10-CM | POA: Diagnosis not present

## 2015-09-22 DIAGNOSIS — M1712 Unilateral primary osteoarthritis, left knee: Secondary | ICD-10-CM | POA: Diagnosis not present

## 2015-09-23 ENCOUNTER — Other Ambulatory Visit: Payer: Self-pay | Admitting: *Deleted

## 2015-09-23 NOTE — Telephone Encounter (Signed)
Opened in error

## 2015-09-24 ENCOUNTER — Telehealth: Payer: Self-pay | Admitting: Emergency Medicine

## 2015-09-24 NOTE — Telephone Encounter (Signed)
-----   Message from Kem Boroughs, [place] sent at 09/21/2015  5:20 PM EDT ----- Please let pt know her lab results.  The Hep C and HIV were negative as expected.  The Vit D is way up from last year 71 compared to 36.  She can now go off Vit during the summer and in the fall go to every other week.  Lipid panel shows elevated total cholesterol and triglycerides. She will need to follow a low cholesterol diet.   CMP is normal.

## 2015-09-24 NOTE — Telephone Encounter (Signed)
Spoke with patient and she is given message from Kem Boroughs, [place] regarding her lab results. Verbalized understanding of results.  She would like Patty to know that she is going to have consult with orthopedic surgeon on Friday to discuss potential R knee surgery.  She is advised to call back if she needs assistance and agreeable to do so.  Will follow up as needed and as scheduled for next annual exam.  Routing to provider for final review. Patient agreeable to disposition. Will close encounter.

## 2015-09-26 DIAGNOSIS — M23203 Derangement of unspecified medial meniscus due to old tear or injury, right knee: Secondary | ICD-10-CM | POA: Diagnosis not present

## 2015-09-26 DIAGNOSIS — M1711 Unilateral primary osteoarthritis, right knee: Secondary | ICD-10-CM | POA: Diagnosis not present

## 2015-09-29 ENCOUNTER — Other Ambulatory Visit: Payer: Self-pay

## 2015-09-29 MED ORDER — PRAVASTATIN SODIUM 40 MG PO TABS: 40.0000 mg | ORAL_TABLET | Freq: Every day | ORAL | Status: DC

## 2016-01-07 DIAGNOSIS — M25561 Pain in right knee: Secondary | ICD-10-CM | POA: Diagnosis not present

## 2016-01-07 DIAGNOSIS — M170 Bilateral primary osteoarthritis of knee: Secondary | ICD-10-CM | POA: Diagnosis not present

## 2016-01-07 DIAGNOSIS — M25562 Pain in left knee: Secondary | ICD-10-CM | POA: Diagnosis not present

## 2016-01-13 DIAGNOSIS — R05 Cough: Secondary | ICD-10-CM | POA: Diagnosis not present

## 2016-01-13 DIAGNOSIS — H65191 Other acute nonsuppurative otitis media, right ear: Secondary | ICD-10-CM | POA: Diagnosis not present

## 2016-01-13 DIAGNOSIS — J0100 Acute maxillary sinusitis, unspecified: Secondary | ICD-10-CM | POA: Diagnosis not present

## 2016-01-13 DIAGNOSIS — J029 Acute pharyngitis, unspecified: Secondary | ICD-10-CM | POA: Diagnosis not present

## 2016-01-30 DIAGNOSIS — E785 Hyperlipidemia, unspecified: Secondary | ICD-10-CM | POA: Diagnosis not present

## 2016-01-30 DIAGNOSIS — Z23 Encounter for immunization: Secondary | ICD-10-CM | POA: Diagnosis not present

## 2016-01-30 DIAGNOSIS — M25569 Pain in unspecified knee: Secondary | ICD-10-CM | POA: Diagnosis not present

## 2016-03-10 DIAGNOSIS — M170 Bilateral primary osteoarthritis of knee: Secondary | ICD-10-CM | POA: Diagnosis not present

## 2016-03-10 DIAGNOSIS — M1711 Unilateral primary osteoarthritis, right knee: Secondary | ICD-10-CM | POA: Diagnosis not present

## 2016-03-10 DIAGNOSIS — M1712 Unilateral primary osteoarthritis, left knee: Secondary | ICD-10-CM | POA: Diagnosis not present

## 2016-03-12 ENCOUNTER — Other Ambulatory Visit: Payer: Self-pay | Admitting: Nurse Practitioner

## 2016-03-12 DIAGNOSIS — Z1231 Encounter for screening mammogram for malignant neoplasm of breast: Secondary | ICD-10-CM

## 2016-03-16 DIAGNOSIS — M170 Bilateral primary osteoarthritis of knee: Secondary | ICD-10-CM | POA: Diagnosis not present

## 2016-03-24 DIAGNOSIS — M170 Bilateral primary osteoarthritis of knee: Secondary | ICD-10-CM | POA: Diagnosis not present

## 2016-04-20 ENCOUNTER — Ambulatory Visit
Admission: RE | Admit: 2016-04-20 | Discharge: 2016-04-20 | Disposition: A | Discharge status: Home / Self Care | Payer: Medicare Other | Source: Ambulatory Visit | Attending: Nurse Practitioner | Admitting: Nurse Practitioner

## 2016-04-20 DIAGNOSIS — Z1231 Encounter for screening mammogram for malignant neoplasm of breast: Secondary | ICD-10-CM | POA: Diagnosis not present

## 2016-04-22 ENCOUNTER — Other Ambulatory Visit: Payer: Self-pay | Admitting: Nurse Practitioner

## 2016-04-22 DIAGNOSIS — R928 Other abnormal and inconclusive findings on diagnostic imaging of breast: Secondary | ICD-10-CM

## 2016-05-05 DIAGNOSIS — M1712 Unilateral primary osteoarthritis, left knee: Secondary | ICD-10-CM | POA: Diagnosis not present

## 2016-05-05 DIAGNOSIS — M1711 Unilateral primary osteoarthritis, right knee: Secondary | ICD-10-CM | POA: Diagnosis not present

## 2016-05-10 DIAGNOSIS — Z923 Personal history of irradiation: Secondary | ICD-10-CM

## 2016-05-10 DIAGNOSIS — C50919 Malignant neoplasm of unspecified site of unspecified female breast: Secondary | ICD-10-CM

## 2016-05-10 HISTORY — PX: BREAST LUMPECTOMY: SHX2

## 2016-05-10 HISTORY — DX: Malignant neoplasm of unspecified site of unspecified female breast: C50.919

## 2016-05-10 HISTORY — DX: Personal history of irradiation: Z92.3

## 2016-05-11 DIAGNOSIS — J309 Allergic rhinitis, unspecified: Secondary | ICD-10-CM | POA: Diagnosis not present

## 2016-05-11 DIAGNOSIS — R413 Other amnesia: Secondary | ICD-10-CM | POA: Diagnosis not present

## 2016-05-11 DIAGNOSIS — M25562 Pain in left knee: Secondary | ICD-10-CM | POA: Diagnosis not present

## 2016-05-11 DIAGNOSIS — Z01818 Encounter for other preprocedural examination: Secondary | ICD-10-CM | POA: Diagnosis not present

## 2016-05-14 DIAGNOSIS — Z85828 Personal history of other malignant neoplasm of skin: Secondary | ICD-10-CM | POA: Diagnosis not present

## 2016-05-14 DIAGNOSIS — D1801 Hemangioma of skin and subcutaneous tissue: Secondary | ICD-10-CM | POA: Diagnosis not present

## 2016-05-14 DIAGNOSIS — L821 Other seborrheic keratosis: Secondary | ICD-10-CM | POA: Diagnosis not present

## 2016-05-14 DIAGNOSIS — L308 Other specified dermatitis: Secondary | ICD-10-CM | POA: Diagnosis not present

## 2016-05-14 DIAGNOSIS — L814 Other melanin hyperpigmentation: Secondary | ICD-10-CM | POA: Diagnosis not present

## 2016-05-18 ENCOUNTER — Ambulatory Visit: Payer: Self-pay | Admitting: Orthopedic Surgery

## 2016-05-18 ENCOUNTER — Other Ambulatory Visit: Payer: Self-pay | Admitting: Nurse Practitioner

## 2016-05-18 ENCOUNTER — Ambulatory Visit
Admission: RE | Admit: 2016-05-18 | Discharge: 2016-05-18 | Disposition: A | Discharge status: Home / Self Care | Payer: Medicare Other | Source: Ambulatory Visit | Attending: Nurse Practitioner | Admitting: Nurse Practitioner

## 2016-05-18 DIAGNOSIS — R928 Other abnormal and inconclusive findings on diagnostic imaging of breast: Secondary | ICD-10-CM

## 2016-05-18 DIAGNOSIS — R921 Mammographic calcification found on diagnostic imaging of breast: Secondary | ICD-10-CM

## 2016-05-19 ENCOUNTER — Ambulatory Visit
Admission: RE | Admit: 2016-05-19 | Discharge: 2016-05-19 | Disposition: A | Discharge status: Home / Self Care | Payer: Medicare Other | Source: Ambulatory Visit | Attending: Nurse Practitioner | Admitting: Nurse Practitioner

## 2016-05-19 DIAGNOSIS — C50211 Malignant neoplasm of upper-inner quadrant of right female breast: Secondary | ICD-10-CM | POA: Diagnosis not present

## 2016-05-19 DIAGNOSIS — R921 Mammographic calcification found on diagnostic imaging of breast: Secondary | ICD-10-CM | POA: Diagnosis not present

## 2016-05-19 HISTORY — PX: BREAST SURGERY: SHX581

## 2016-05-21 ENCOUNTER — Telehealth: Payer: Self-pay | Admitting: *Deleted

## 2016-05-21 NOTE — Telephone Encounter (Signed)
Mailed [facility] packet to pt. 

## 2016-05-21 NOTE — Telephone Encounter (Signed)
Confirmed [facility] for 05/26/16 at 0815 .  Instructions and contact information given.

## 2016-05-21 NOTE — Telephone Encounter (Signed)
Left vm regarding [place] appt for 05/26/16. Contact information provided.

## 2016-05-25 DIAGNOSIS — M1712 Unilateral primary osteoarthritis, left knee: Secondary | ICD-10-CM | POA: Diagnosis not present

## 2016-05-25 DIAGNOSIS — C50411 Malignant neoplasm of upper-outer quadrant of right female breast: Secondary | ICD-10-CM | POA: Insufficient documentation

## 2016-05-25 NOTE — Progress Notes (Signed)
[facility]  Telephone:([phone]) [phone] Fax:([phone]) [phone]  Clinic New Consult Note   Patient Care Team: No Pcp Per Patient as PCP - General ([place]) Kem Boroughs, FNP as Nurse Practitioner (Family Medicine) [name], MD as Consulting Physician (General Surgery) Truitt Merle, MD as Consulting Physician (Hematology) Gery Pray, MD as Consulting Physician (Radiation Oncology) 05/26/2016  CHIEF COMPLAINTS/PURPOSE OF CONSULTATION:  Newly diagnosed right breast DCIS  Oncology History   Ductal carcinoma in situ (DCIS) of right breast   Staging form: Breast, AJCC 8th Edition   - Clinical: Stage 0 (cTis (DCIS), cN0, cM0, ER: Negative, PR: Negative) - Signed by Truitt Merle, MD on 05/25/2016      Ductal carcinoma in situ (DCIS) of right breast   05/18/2016 Mammogram    Diagnostic mammogram of the right breast showed heterogeneous calcifications in a linear orientation within the posterior upper right breast and spanning a distance of 2.2 cm. No associated mass identified.      05/19/2016 Initial Biopsy    Right breast core needle biopsy showed ductal carcinoma with calcification, high-grade, focal highly suspicious for stromal invasion.      05/19/2016 Receptors her2    ER negative, PR negative      05/19/2016 Initial Diagnosis    Ductal carcinoma in situ (DCIS) of right breast        HISTORY OF PRESENTING ILLNESS:  Dawn West 66 y.o. female is here because of Her recently diagnosed right breast DCIS. She presents to our multidisciplinary breast clinic with her husband today.  This was discovered by screening mammogram, she is in no palpable breast mass, and denies any new symptoms lately. Her diagnostic mammogram on 10/16/2016 showed heterogeneous calcification in the right breast spanning 2.2 cm. Core needle biopsy of the right breast lesion showed high-grade DCIS, focally suspicious for stromal invasion. ER/PR negative.   She has chronic bilateral  knee pain from arthritis for the past 3 years, he has been getting much worse since a month ago. She is scheduled to have a total left knee replacement on 06/10/2016, she walks with a walker. No other arthritis or other complaints.  GYN HISTORY  Menarchal:12 LMP: age of 42 before hysterectomy  Contraceptive: 26 yars  HRT: 69 years, will stop now  G1P1: 108 yo daughter     MEDICAL HISTORY:  Past Medical History:  Diagnosis Date  . Anxiety   . Cancer ([place]) 12/01/11   basal cell skin ca Left upper arm  . Depression     SURGICAL HISTORY: Past Surgical History:  Procedure Laterality Date  . MOUTH SURGERY    . SKIN CANCER EXCISION Left 2013   upper arm, basal cell  . TONSILLECTOMY AND ADENOIDECTOMY    . TOTAL VAGINAL HYSTERECTOMY  12/1996   menorrhagia, fibroids    SOCIAL HISTORY: Social History   Social History  . Marital status: Married    Spouse name: N/A  . Number of children: 1  . Years of education: N/A   Occupational History  . Not on file.   Social History Main Topics  . Smoking status: Never Smoker  . Smokeless tobacco: Never Used  . Alcohol use 0.0 oz/week     Comment: 3 times yearly  . Drug use: No  . Sexual activity: Yes    Partners: Male    Birth control/ protection: Post-menopausal, Surgical     Comment: Hysterectomy   Other Topics Concern  . Not on file   Social History Narrative  .  No narrative on file    FAMILY HISTORY: Family History  Problem Relation Age of Onset  . Hypertension Mother   . Heart failure Mother   . Diabetes Mother   . Stroke Mother   . COPD Mother   . Hyperlipidemia Mother   . Hypertension Father   . Heart failure Father   . Hypertension Brother   . Hypertension Sister   . Hyperlipidemia Sister   . Hypertension Brother   . Hypertension Brother   . Diabetes Brother     ALLERGIES:  is allergic to allegra [fexofenadine hcl]; ciprofloxacin; xanax [alprazolam]; penicillins; and sulfa antibiotics.  MEDICATIONS:    Current Outpatient Prescriptions  Medication Sig Dispense Refill  . acetaminophen (TYLENOL) 650 MG CR tablet Take 650 mg by mouth every 8 (eight) hours as needed for pain.    . Calcium Carb-Cholecalciferol (CALCIUM 600 + D) 600-200 MG-UNIT TABS Take 1 tablet by mouth 2 (two) times daily.     . cetirizine (ZYRTEC) 10 MG tablet Take 10 mg by mouth daily.    . diphenhydrAMINE (EQ NIGHTTIME SLEEP AID) 25 MG tablet Take 25 mg by mouth at bedtime as needed for sleep.    Marland Kitchen estradiol (ESTRACE) 0.5 MG tablet Take 1 tablet (0.5 mg total) by mouth daily. 90 tablet 3  . fluticasone (FLONASE) 50 MCG/ACT nasal spray Place 1 spray into both nostrils 2 (two) times daily.     . Multiple Vitamin (MULTIVITAMIN) tablet Take 1 tablet by mouth daily.    . Omega-3 Fatty Acids (FISH OIL PO) Take 1,000 mg by mouth. Takes 1-3 times daily    . sertraline (ZOLOFT) 100 MG tablet Take 1 tablet (100 mg total) by mouth daily. 90 tablet 3  . Vitamin D, Ergocalciferol, (DRISDOL) 50000 units CAPS capsule Take 1 capsule (50,000 Units total) by mouth every 7 (seven) days. (Patient taking differently: Take 50,000 Units by mouth every 7 (seven) days. Takes every other week) 30 capsule 2  . vitamin E 400 UNIT capsule Take 400 Units by mouth 3 (three) times daily.     Marland Kitchen aspirin EC 81 MG tablet Take 81 mg by mouth daily.    . diclofenac (VOLTAREN) 75 MG EC tablet Take 75 mg by mouth daily.    . Magnesium 250 MG TABS Take 250 mg by mouth 3 (three) times daily.     . pravastatin (PRAVACHOL) 40 MG tablet Take 1 tablet (40 mg total) by mouth daily. 30 tablet 3   No current facility-administered medications for this visit.    Facility-Administered Medications Ordered in Other Visits  Medication Dose Route Frequency Provider Last Rate Last Dose  . clindamycin (CLEOCIN) 900 mg in dextrose 5 % 50 mL IVPB  900 mg Intravenous On Call to [place], MD       And  . gentamicin (GARAMYCIN) 350 mg in dextrose 5 % 50 mL IVPB  5 mg/kg  Intravenous On Call to [place], MD        REVIEW OF SYSTEMS:   Constitutional: Denies fevers, chills or abnormal night sweats Eyes: Denies blurriness of vision, double vision or watery eyes Ears, nose, mouth, throat, and face: Denies mucositis or sore throat Respiratory: Denies cough, dyspnea or wheezes Cardiovascular: Denies palpitation, chest discomfort or lower extremity swelling Gastrointestinal:  Denies nausea, heartburn or change in bowel habits Skin: Denies abnormal skin rashes Lymphatics: Denies new lymphadenopathy or easy bruising Neurological:Denies numbness, tingling or new weaknesses Behavioral/Psych: Mood is stable, no new changes  All other systems were reviewed with the patient and are negative.  PHYSICAL EXAMINATION: ECOG PERFORMANCE STATUS: 3 - Symptomatic, >50% confined to bed  Vitals:   05/26/16 0837  BP: (!) 143/62  Pulse: 84  Resp: 19  Temp: 98.1 F (36.7 C)   Filed Weights   05/26/16 0837  Weight: 152 lb 6.4 oz (69.1 kg)    GENERAL:alert, no distress and comfortable SKIN: skin color, texture, turgor are normal, no rashes or significant lesions EYES: normal, conjunctiva are pink and non-injected, sclera clear OROPHARYNX:no exudate, no erythema and lips, buccal mucosa, and tongue normal  NECK: supple, thyroid normal size, non-tender, without nodularity LYMPH:  no palpable lymphadenopathy in the cervical, axillary or inguinal LUNGS: clear to auscultation and percussion with normal breathing effort HEART: regular rate & rhythm and no murmurs and no lower extremity edema ABDOMEN:abdomen soft, non-tender and normal bowel sounds Musculoskeletal:no cyanosis of digits and no clubbing  PSYCH: alert & oriented x 3 with fluent speech NEURO: no focal motor/sensory deficits Breasts: Breast inspection showed them to be symmetrical with no nipple discharge. Palpation of the breasts and axilla revealed no obvious mass that I could appreciate.  LABORATORY  DATA:  I have reviewed the data as listed CBC Latest Ref Rng & Units 07/12/2014 06/28/2014 06/28/2014  WBC 4.0 - 10.5 K/uL 4.5 CANCELED -  Hemoglobin 12.0 - 15.0 g/dL 13.6 14.1 CANCELED  Hematocrit 36.0 - 46.0 % 40.3 CANCELED -  Platelets 150 - 400 K/uL 294 CANCELED -   CMP Latest Ref Rng & Units 09/19/2015  Glucose 65 - 99 mg/dL 92  BUN 7 - 25 mg/dL 17  Creatinine 0.50 - 0.99 mg/dL 0.66  Sodium 135 - 146 mmol/L 140  Potassium 3.5 - 5.3 mmol/L 4.4  Chloride 98 - 110 mmol/L 102  CO2 20 - 31 mmol/L 27  Calcium 8.6 - 10.4 mg/dL 9.2  Total Protein 6.1 - 8.1 g/dL 6.7  Total Bilirubin 0.2 - 1.2 mg/dL 0.4  Alkaline Phos 33 - 130 U/L 53  AST 10 - 35 U/L 16  ALT 6 - 29 U/L 17   PATHOLOGY REPORT  Diagnosis Breast, right, needle core biopsy, UIQ - DUCTAL CARCINOMA WITH CALCIFICATIONS. - SEE COMMENT. Microscopic Comment The carcinoma is predominantly in situ and is high grade. There are foci highly suspicious for stromal invasion. Estrogen receptor and progesterone receptors studies will be performed and the results reported separately. The results were called to [facility] on 05/20/16. (JBK:gt, 05/20/16)  Results: IMMUNOHISTOCHEMICAL AND MORPHOMETRIC ANALYSIS PERFORMED MANUALLY Estrogen Receptor: 0%, NEGATIVE Progesterone Receptor: 0%, NEGATIVE    RADIOGRAPHIC STUDIES: I have personally reviewed the radiological images as listed and agreed with the findings in the report. Mm Digital Diagnostic Unilat R  Result Date: 05/18/2016 CLINICAL DATA:  66 year old female for further evaluation of right breast calcifications identified on screening mammogram. EXAM: DIGITAL DIAGNOSTIC RIGHT MAMMOGRAM WITH CAD COMPARISON:  Previous exam(s). ACR Breast Density Category b: There are scattered areas of fibroglandular density. FINDINGS: Full field and magnification views of the right breast demonstrate heterogeneous calcifications in a linear orientation within the posterior upper right  breast and spanning a distance of 2.2 cm. No associated mass identified. Mammographic images were processed with CAD. IMPRESSION: Suspicious calcifications within the upper right breast. Tissue sampling is recommended. RECOMMENDATION: Stereotactic guided right breast biopsy which will be arranged. I have discussed the findings and recommendations with the patient. Results were also provided in writing at the conclusion of the visit. If  applicable, a reminder letter will be sent to the patient regarding the next appointment. BI-RADS CATEGORY  4: Suspicious. Electronically Signed   By: Margarette Canada M.D.   On: 05/18/2016 12:18   Mm Clip Placement Right  Result Date: 05/19/2016 CLINICAL DATA:  Post stereotactic core needle biopsy of right breast upper inner quadrant calcifications. EXAM: DIAGNOSTIC RIGHT MAMMOGRAM POST STEREOTACTIC BIOPSY COMPARISON:  Previous exam(s). FINDINGS: Mammographic images were obtained following stereotactic guided biopsy of right breast calcifications. Two-view mammography demonstrates presence of a coil shaped marker within the slightly upper inner quadrant of the right breast, posterior depth. The marker is noted to be 1.8 cm inferior from the residual calcifications on the ML view. IMPRESSION: Placement of coil shaped marker post stereotactic core needle biopsy of the right breast. The marker is 1.8 cm inferior to the residual calcifications. Final Assessment: Post Procedure Mammograms for Marker Placement Electronically Signed   By: Fidela Salisbury M.D.   On: 05/19/2016 14:44   Mm Rt Breast Bx W Loc Dev 1st Lesion Image Bx Spec Stereo Guide  Addendum Date: 05/20/2016   ADDENDUM REPORT: 05/20/2016 14:27 ADDENDUM: Pathology revealed HIGH GRADE DUCTAL CARCINOMA WITH CALCIFICATIONS of the Right breast, upper inner quadrant. The carcinoma is predominantly in situ and is high grade. There are foci highly suspicious for stromal invasion. This was found to be concordant by Dr. Fidela Salisbury. Pathology results were discussed with the patient by telephone. The patient reported doing well after the biopsy with tenderness at the site. Post biopsy instructions and care were reviewed and questions were answered. The patient was encouraged to call [facility] for any additional concerns. The patient was referred to [facility] on May 26, 2016. Pathology results reported by Terie Purser, RN on 05/20/2016. Electronically Signed   By: Fidela Salisbury M.D.   On: 05/20/2016 14:27   Result Date: 05/20/2016 CLINICAL DATA:  Right breast slightly upper inner quadrant group of calcifications. EXAM: RIGHT BREAST STEREOTACTIC CORE NEEDLE BIOPSY COMPARISON:  Previous exams. FINDINGS: The patient and I discussed the procedure of stereotactic-guided biopsy including benefits and alternatives. We discussed the high likelihood of a successful procedure. We discussed the risks of the procedure including infection, bleeding, tissue injury, clip migration, and inadequate sampling. Informed written consent was given. The usual time out protocol was performed immediately prior to the procedure. Using sterile technique and 1% Lidocaine as local anesthetic, under stereotactic guidance, a 9 gauge vacuum assisted device was used to perform core needle biopsy of calcifications in the slightly upper inner quadrant of the right breast, posterior depth using a superior approach. Specimen radiograph was performed showing presence of calcifications. Specimens with calcifications are identified for pathology. At the conclusion of the procedure, a coil shaped tissue marker clip was deployed into the biopsy cavity. Follow-up 2-view mammogram was performed and dictated separately. IMPRESSION: Stereotactic-guided biopsy of right breast calcifications. No apparent complications. Electronically Signed: By: Fidela Salisbury  M.D. On: 05/19/2016 14:41    ASSESSMENT & PLAN:  66 y.o. Caucasian female, postmenopausal,  presents with screening discovered right breast DCIS  1. DCIS of right breast, high-grade, ER and PR negative -I discussed her breast imaging and needle biopsy results with patient and her family members in great detail. -Her biopsy showed high-grade DCIS, however he says focal suspicion for stromal invasion, invasive disease needed to be ruled out on final surgical path -She is a candidate for  breast conservation surgery. She has been seen by breast surgeon Dr. Barry Dienes, who recommends lumpectomy and SLN biopsy -Her DCIS will be cured by complete surgical resection. Any form of adjuvant therapy is preventive. -Given the negative ER and PR, she would not benefit from adjuvant antiestrogen therapy. -If her final surgical past reveals invasive component, we'll make a decision about adjuvant chemotherapy based on the size of the invasive carcinoma and HER2 status -She will likely benefit from breast radiation if she undergo lumpectomy to decrease the risk of breast cancer. -We also discussed that biopsy may have sampling limitation, we will review her surgical path, to see if she has any invasive carcinoma components. -We discussed breast cancer surveillance after she completes treatment, Including annual mammogram, breast exam every 6-12 months.  Plan -She will likely have lumpectomy and sentinel lymph node biopsy soon -I'll review her final surgical path, to see if she has any component of invasive carcinoma. If her surgical path showed DCIS only, she will follow-up with her primary care physician for breast cancer surveillance in the future.  All questions were answered. The patient knows to call the clinic with any problems, questions or concerns. I spent 40 minutes counseling the patient face to face. The total time spent in the appointment was 50 minutes and more than 50% was on counseling.      Truitt Merle, MD 05/26/2016 3:03 PM

## 2016-05-26 ENCOUNTER — Encounter: Payer: Self-pay | Admitting: Hematology

## 2016-05-26 ENCOUNTER — Encounter: Payer: Self-pay | Admitting: Physical Therapy

## 2016-05-26 ENCOUNTER — Other Ambulatory Visit: Payer: Self-pay | Admitting: General Surgery

## 2016-05-26 ENCOUNTER — Encounter: Payer: Self-pay | Admitting: *Deleted

## 2016-05-26 ENCOUNTER — Ambulatory Visit (HOSPITAL_BASED_OUTPATIENT_CLINIC_OR_DEPARTMENT_OTHER): Payer: Medicare Other | Admitting: Hematology

## 2016-05-26 ENCOUNTER — Ambulatory Visit
Admission: RE | Admit: 2016-05-26 | Discharge: 2016-05-26 | Disposition: A | Discharge status: Home / Self Care | Payer: Medicare Other | Source: Ambulatory Visit | Attending: Radiation Oncology | Admitting: Radiation Oncology

## 2016-05-26 ENCOUNTER — Ambulatory Visit: Payer: Medicare Other | Attending: General Surgery | Admitting: Physical Therapy

## 2016-05-26 ENCOUNTER — Other Ambulatory Visit: Payer: Medicare Other

## 2016-05-26 DIAGNOSIS — D0511 Intraductal carcinoma in situ of right breast: Secondary | ICD-10-CM | POA: Diagnosis not present

## 2016-05-26 DIAGNOSIS — R262 Difficulty in walking, not elsewhere classified: Secondary | ICD-10-CM | POA: Diagnosis not present

## 2016-05-26 DIAGNOSIS — Z171 Estrogen receptor negative status [ER-]: Secondary | ICD-10-CM

## 2016-05-26 DIAGNOSIS — R293 Abnormal posture: Secondary | ICD-10-CM | POA: Diagnosis not present

## 2016-05-26 DIAGNOSIS — C50211 Malignant neoplasm of upper-inner quadrant of right female breast: Secondary | ICD-10-CM

## 2016-05-26 MED ORDER — DEXTROSE 5 % IV SOLN: 900.0000 mg | INTRAVENOUS | Status: AC

## 2016-05-26 MED ORDER — GENTAMICIN SULFATE 40 MG/ML IJ SOLN: 5.0000 mg/kg | INTRAVENOUS | Status: AC

## 2016-05-26 NOTE — Therapy (Signed)
[place], [place], [address] Phone: [phone]   Fax:  [phone]  Physical Therapy Evaluation  Patient Details  Name: Dawn West MRN: [id number] Date of Birth: 24-Feb-1951 Referring Provider: Dr. [name]  Encounter Date: 05/26/2016      PT End of Session - 05/26/16 1032    Visit Number 1   Number of Visits 1   PT Start Time 1000   PT Stop Time 1015  Also saw pt from 1030-1043 for a total of 28 minutes   PT Time Calculation (min) 15 min   Activity Tolerance Patient tolerated treatment well   Behavior During Therapy [facility] for tasks assessed/performed      Past Medical History:  Diagnosis Date  . Anxiety   . Cancer ([place]) 12/01/11   basal cell skin ca Left upper arm  . Depression     Past Surgical History:  Procedure Laterality Date  . MOUTH SURGERY    . SKIN CANCER EXCISION Left 2013   upper arm, basal cell  . TONSILLECTOMY AND ADENOIDECTOMY    . TOTAL VAGINAL HYSTERECTOMY  12/1996   menorrhagia, fibroids    There were no vitals filed for this visit.       Subjective Assessment - 05/26/16 1033    Subjective Patient reports she is here to be seen by her medical team for her newly diagnosed right breast cancer.   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 It is located in the upper inner quadrant and measures 2.2 cm. It is ER/PR negative. She also has severe bilateral knee osteoarthritis and is scheduled for a left total knee replacement on 06/10/16.    Patient Stated Goals Reduce lymphedema risk and learn post op HEP            [facility] PT Assessment - 05/26/16 0001      Assessment   Medical Diagnosis Right breast cancer   Referring Provider Dr. [name]   Onset Date/Surgical Date 04/29/16   Hand Dominance Right   Prior Therapy none     Precautions   Precautions Other (comment)   Precaution Comments Active cancer; nedes left TKR; walks with [facility]     Restrictions   Weight Bearing Restrictions No     Balance Screen   Has the patient fallen in the past 6 months No   Has the patient had a decrease in activity level because of a fear of falling?  No   Is the patient reluctant to leave their home because of a fear of falling?  No     Home Ecologist residence   Living Arrangements Spouse/significant other   Available Help at Discharge Family     Prior Function   Level of Independence Independent with community mobility with device   Vocation Retired   Leisure She was walking and riding her stationary bike 3x/week for 20 minutes but can't exercise due to her knee now     Cognition   Overall Cognitive Status Within Functional Limits for tasks assessed     Posture/Postural Control   Posture/Postural Control Postural limitations   Postural Limitations Forward head;Rounded Shoulders     ROM / Strength   AROM / PROM / Strength AROM;Strength     AROM   AROM Assessment Site Shoulder;Cervical   Right/Left Shoulder Right;Left   Right Shoulder  Extension 58 Degrees   Right Shoulder Flexion 158 Degrees   Right Shoulder ABduction 169 Degrees   Right Shoulder Internal Rotation 66 Degrees   Right Shoulder External Rotation 88 Degrees   Left Shoulder Extension 51 Degrees   Left Shoulder Flexion 137 Degrees   Left Shoulder ABduction 152 Degrees   Left Shoulder Internal Rotation 71 Degrees   Left Shoulder External Rotation 80 Degrees   Cervical Flexion WNL   Cervical Extension WNL   Cervical - Right Side Bend WNL   Cervical - Left Side Bend WNL   Cervical - Right Rotation WNL   Cervical - Left Rotation WNL     Strength   Overall Strength Within functional limits for tasks performed           LYMPHEDEMA/ONCOLOGY QUESTIONNAIRE - 05/26/16 1031      Type   Cancer Type Right breast cancer      Lymphedema Assessments   Lymphedema Assessments Upper extremities     Right Upper Extremity Lymphedema   10 cm Proximal to Olecranon Process 27.2 cm   Olecranon Process 23.8 cm   10 cm Proximal to Ulnar Styloid Process 23.5 cm   Just Proximal to Ulnar Styloid Process 16.5 cm   Across Hand at [facility] 18.5 cm   At [place] of 2nd Digit 6.4 cm     Left Upper Extremity Lymphedema   10 cm Proximal to Olecranon Process 27.3 cm   Olecranon Process 23.7 cm   10 cm Proximal to Ulnar Styloid Process 22.5 cm   Just Proximal to Ulnar Styloid Process 16.1 cm   Across Hand at [facility] 18.2 cm   At [place] of 2nd Digit 6.3 cm            Patient was instructed today in a home exercise program today for post op shoulder range of motion. These included active assist shoulder flexion in sitting, scapular retraction, wall walking with shoulder abduction, and hands behind head external rotation.  She was encouraged to do these twice a day, holding 3 seconds and repeating 5 times when permitted by her physician.               PT Education - 05/26/16 1032    Education provided Yes   Education Details Lymphedema risk reduction and post op shoulder ROM HEP   Person(s) Educated Patient;Spouse   Methods Explanation;Demonstration;Handout   Comprehension Returned demonstration;Verbalized understanding              Breast Clinic Goals - 05/26/16 1051      Patient will be able to verbalize understanding of pertinent lymphedema risk reduction practices relevant to her diagnosis specifically related to skin care.   Time 1   Period Days   Status Achieved     Patient will be able to return demonstrate and/or verbalize understanding of the post-op home exercise program related to regaining shoulder range of motion.   Time 1   Period Days   Status Achieved     Patient will be able to verbalize understanding of the importance of attending the postoperative After Breast Cancer  Class for further lymphedema risk reduction education and therapeutic exercise.   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 It is located  in the upper inner quadrant and measures 2.2 cm. It is ER/PR negative. She also has severe bilateral knee osteoarthritis and is scheduled for a left total knee replacement on 06/10/16. Her multidisciplinary medical team met prior to her assessments to determine a recommended treatment plan.  She is planning to have a right lumpectomy and sentinel node biopsy followed by radiation. She will likely undergo breast surgery before her left knee replacement which is scheduled for 06/10/16. She wil benefit from post op PT to regain shoulder ROM and reduce lymphedema. Due to her complex situation with having a total knee replacement on 06/10/16 and her evolving situation, her eval is of moderate complexity.   Rehab Potential Good   Clinical Impairments Affecting Rehab Potential Above stated comorbidities   PT Frequency One time visit   PT Treatment/Interventions Patient/family education;Therapeutic exercise   PT Next Visit Plan Will f/u after surgery to determine PT needs   PT Home Exercise Plan Post op shoulder ROM HEP   Consulted and Agree with Plan of Care Patient;Family member/caregiver   Family Member Consulted Husband      Patient will benefit from skilled therapeutic intervention in order to improve the following deficits and impairments:  Postural dysfunction, Decreased knowledge of precautions, Pain, Impaired UE functional use, Decreased range of motion  Visit Diagnosis: Carcinoma of upper-inner quadrant of right breast in female, estrogen receptor negative ([place]) - Plan: PT plan of care cert/re-cert  Abnormal posture - Plan: PT plan of care cert/re-cert  Difficulty in walking, not elsewhere  classified - Plan: PT plan of care cert/re-cert  Patient will follow up at outpatient cancer rehab if needed following surgery.  If the patient requires physical therapy at that time, a specific plan will be dictated and sent to the referring physician for approval. The patient was educated today on appropriate basic range of motion exercises to begin post operatively and the importance of attending the After Breast Cancer class following surgery.  Patient was educated today on lymphedema risk reduction practices as it pertains to recommendations that will benefit the patient immediately following surgery.  She verbalized good understanding.  No additional physical therapy is indicated at this time.         G-Codes - May 27, 2016 1052    Functional Assessment Tool Used Clinical Judgement   Functional Limitation Other PT primary   Other PT Primary Current Status (Z6109) At least 20 percent but less than 40 percent impaired, limited or restricted   Other PT Primary Goal Status (U0454) At least 20 percent but less than 40 percent impaired, limited or restricted   Other PT Primary Discharge Status (U9811) At least 20 percent but less than 40 percent impaired, limited or restricted       Problem List Patient Active Problem List   Diagnosis Date Noted  . Ductal carcinoma in situ (DCIS) of right breast 05/25/2016    Annia Friendly, PT May 27, 2016 12:28 PM  [place], [place], [address] Phone: [phone]   Fax:  [phone]  Name: Dawn West MRN: [id number] Date of Birth: [birth year]

## 2016-05-26 NOTE — Progress Notes (Signed)
Radiation Oncology         [phone]) [phone] ________________________________  Initial outpatient Consultation  Name: Dawn West MRN: [id number]  Date: 05/26/2016  DOB: 1951/04/06  CC:No PCP Per Patient  [name], MD   REFERRING PHYSICIAN: [name], MD  DIAGNOSIS: The encounter diagnosis was Ductal carcinoma in situ (DCIS) of right breast. Presenting in the upper inner quadrant of the right breast  HISTORY OF PRESENT ILLNESS::Dawn West is a 66 y.o. female who is seen today in the multidisciplinary breast clinic. She underwent a recent screening mammogram which showed calcifications within the upper inner aspect of the right breast. This extended over approximately 2.2 cm and a linear orientation. No mass was appreciated on imaging. Patient proceeded to undergo biopsy of this area which revealed high-grade ductal carcinoma in situ. The biopsy did show foci highly suspicious for stromal invasion.  Patient reports no pain within the breast area nipple discharge or bleeding prior to diagnosis. The patient denies any new bony pain headaches dizziness or blurred vision. She does have significant pain in her left knee and is scheduled to undergo surgery in early February concerning this issue..  Oncology History   Ductal carcinoma in situ (DCIS) of right breast   Staging form: Breast, AJCC 8th Edition   - Clinical: Stage 0 (cTis (DCIS), cN0, cM0, ER: Negative, PR: Negative) - Signed by Truitt Merle, MD on 05/25/2016      Ductal carcinoma in situ (DCIS) of right breast   05/18/2016 Mammogram    Diagnostic mammogram of the right breast showed heterogeneous calcifications in a linear orientation within the posterior upper right breast and spanning a distance of 2.2 cm. No associated mass identified.      05/19/2016 Initial Biopsy    Right breast core needle biopsy showed ductal carcinoma with calcification, high-grade, focal highly suspicious for stromal invasion.      05/19/2016  Receptors her2    ER negative, PR negative      05/19/2016 Initial Diagnosis    Ductal carcinoma in situ (DCIS) of right breast       PREVIOUS RADIATION THERAPY: No  PAST MEDICAL HISTORY:  has a past medical history of Anxiety; Cancer ([place]) (12/01/11); and Depression.    PAST SURGICAL HISTORY: Past Surgical History:  Procedure Laterality Date  . MOUTH SURGERY    . SKIN CANCER EXCISION Left 2013   upper arm, basal cell  . TONSILLECTOMY AND ADENOIDECTOMY    . TOTAL VAGINAL HYSTERECTOMY  12/1996   menorrhagia, fibroids    FAMILY HISTORY: family history includes COPD in her mother; Diabetes in her brother and mother; Heart failure in her father and mother; Hyperlipidemia in her mother and sister; Hypertension in her brother, brother, brother, father, mother, and sister; Stroke in her mother.  SOCIAL HISTORY:  reports that she has never smoked. She has never used smokeless tobacco. She reports that she drinks alcohol. She reports that she does not use drugs.  ALLERGIES: Allegra [fexofenadine hcl]; Ciprofloxacin; Xanax [alprazolam]; Penicillins; and Sulfa antibiotics  MEDICATIONS:  Current Outpatient Prescriptions  Medication Sig Dispense Refill  . acetaminophen (TYLENOL) 650 MG CR tablet Take 650 mg by mouth every 8 (eight) hours as needed for pain.    Marland Kitchen aspirin EC 81 MG tablet Take 81 mg by mouth daily.    . Calcium Carb-Cholecalciferol (CALCIUM 600 + D) 600-200 MG-UNIT TABS Take 1 tablet by mouth 2 (two) times daily.     . cetirizine (ZYRTEC) 10 MG tablet Take 10  mg by mouth daily.    . diclofenac (VOLTAREN) 75 MG EC tablet Take 75 mg by mouth daily.    . diphenhydrAMINE (EQ NIGHTTIME SLEEP AID) 25 MG tablet Take 25 mg by mouth at bedtime as needed for sleep.    Marland Kitchen estradiol (ESTRACE) 0.5 MG tablet Take 1 tablet (0.5 mg total) by mouth daily. 90 tablet 3  . fluticasone (FLONASE) 50 MCG/ACT nasal spray Place 1 spray into both nostrils 2 (two) times daily.     . Magnesium 250 MG TABS  Take 250 mg by mouth 3 (three) times daily.     . Multiple Vitamin (MULTIVITAMIN) tablet Take 1 tablet by mouth daily.    . Omega-3 Fatty Acids (FISH OIL PO) Take 1,000 mg by mouth. Takes 1-3 times daily    . pravastatin (PRAVACHOL) 40 MG tablet Take 1 tablet (40 mg total) by mouth daily. 30 tablet 3  . sertraline (ZOLOFT) 100 MG tablet Take 1 tablet (100 mg total) by mouth daily. 90 tablet 3  . Vitamin D, Ergocalciferol, (DRISDOL) 50000 units CAPS capsule Take 1 capsule (50,000 Units total) by mouth every 7 (seven) days. (Patient taking differently: Take 50,000 Units by mouth every 7 (seven) days. Takes every other week) 30 capsule 2  . vitamin E 400 UNIT capsule Take 400 Units by mouth 3 (three) times daily.      No current facility-administered medications for this encounter.    Facility-Administered Medications Ordered in Other Encounters  Medication Dose Route Frequency Provider Last Rate Last Dose  . clindamycin (CLEOCIN) 900 mg in dextrose 5 % 50 mL IVPB  900 mg Intravenous On Call to [place], MD       And  . gentamicin (GARAMYCIN) 350 mg in dextrose 5 % 50 mL IVPB  5 mg/kg Intravenous On Call to [place], MD        REVIEW OF SYSTEMS:  A 12 point review of systems is documented in the electronic medical record. This was obtained by the nursing staff. However, I reviewed this with the patient to discuss relevant findings and make appropriate changes.    PHYSICAL EXAM: Vitals - 1 value per visit 0/01/3817  SYSTOLIC 299  DIASTOLIC 62  Pulse 84  Temperature 98.1  Respirations 19  Weight (lb) 152.4  Height 5' 2.75"  BMI 27.21  VISIT REPORT   This is a pleasant 66 year old female in no acute distress. She is accompanied by her husband on evaluation today. Examination of the neck and supraclavicular region reveals no evidence of adenopathy. the axillary areas are free of adenopathy. Examination of the lungs reveals them to be clear. The heart has a regular rhythm and  rate. The patient's range of motion in the arms is good on both sides. Examination of the left breast reveals no palpable mass or nipple discharge. Examination of the right breast reveals some bruising in approximately the 12:30 to 1:00 position. No dominant masses appreciated breast nipple discharge or bleeding.     ECOG = 1  LABORATORY DATA:  Lab Results  Component Value Date   WBC 4.5 07/12/2014   HGB 13.6 07/12/2014   HCT 40.3 07/12/2014   MCV 87.8 07/12/2014   PLT 294 07/12/2014   NEUTROABS 2.4 07/12/2014   Lab Results  Component Value Date   NA 140 09/19/2015   K 4.4 09/19/2015   CL 102 09/19/2015   CO2 27 09/19/2015   GLUCOSE 92 09/19/2015   CREATININE 0.66 09/19/2015   CALCIUM  9.2 09/19/2015      RADIOGRAPHY: Mm Digital Diagnostic Unilat R  Result Date: 05/18/2016 CLINICAL DATA:  66 year old female for further evaluation of right breast calcifications identified on screening mammogram. EXAM: DIGITAL DIAGNOSTIC RIGHT MAMMOGRAM WITH CAD COMPARISON:  Previous exam(s). ACR Breast Density Category b: There are scattered areas of fibroglandular density. FINDINGS: Full field and magnification views of the right breast demonstrate heterogeneous calcifications in a linear orientation within the posterior upper right breast and spanning a distance of 2.2 cm. No associated mass identified. Mammographic images were processed with CAD. IMPRESSION: Suspicious calcifications within the upper right breast. Tissue sampling is recommended. RECOMMENDATION: Stereotactic guided right breast biopsy which will be arranged. I have discussed the findings and recommendations with the patient. Results were also provided in writing at the conclusion of the visit. If applicable, a reminder letter will be sent to the patient regarding the next appointment. BI-RADS CATEGORY  4: Suspicious. Electronically Signed   By: Margarette [place] M.D.   On: 05/18/2016 12:18   Mm Clip Placement Right  Result Date:  05/19/2016 CLINICAL DATA:  Post stereotactic core needle biopsy of right breast upper inner quadrant calcifications. EXAM: DIAGNOSTIC RIGHT MAMMOGRAM POST STEREOTACTIC BIOPSY COMPARISON:  Previous exam(s). FINDINGS: Mammographic images were obtained following stereotactic guided biopsy of right breast calcifications. Two-view mammography demonstrates presence of a coil shaped marker within the slightly upper inner quadrant of the right breast, posterior depth. The marker is noted to be 1.8 cm inferior from the residual calcifications on the ML view. IMPRESSION: Placement of coil shaped marker post stereotactic core needle biopsy of the right breast. The marker is 1.8 cm inferior to the residual calcifications. Final Assessment: Post Procedure Mammograms for Marker Placement Electronically Signed   By: Fidela Salisbury M.D.   On: 05/19/2016 14:44   Mm Rt Breast Bx W Loc Dev 1st Lesion Image Bx Spec Stereo Guide  Addendum Date: 05/20/2016   ADDENDUM REPORT: 05/20/2016 14:27 ADDENDUM: Pathology revealed HIGH GRADE DUCTAL CARCINOMA WITH CALCIFICATIONS of the Right breast, upper inner quadrant. The carcinoma is predominantly in situ and is high grade. There are foci highly suspicious for stromal invasion. This was found to be concordant by Dr. Fidela Salisbury. Pathology results were discussed with the patient by telephone. The patient reported doing well after the biopsy with tenderness at the site. Post biopsy instructions and care were reviewed and questions were answered. The patient was encouraged to call [facility] for any additional concerns. The patient was referred to [facility] on May 26, 2016. Pathology results reported by Terie Purser, RN on 05/20/2016. Electronically Signed   By: Fidela Salisbury M.D.   On: 05/20/2016 14:27   Result Date: 05/20/2016 CLINICAL DATA:  Right breast slightly  upper inner quadrant group of calcifications. EXAM: RIGHT BREAST STEREOTACTIC CORE NEEDLE BIOPSY COMPARISON:  Previous exams. FINDINGS: The patient and I discussed the procedure of stereotactic-guided biopsy including benefits and alternatives. We discussed the high likelihood of a successful procedure. We discussed the risks of the procedure including infection, bleeding, tissue injury, clip migration, and inadequate sampling. Informed written consent was given. The usual time out protocol was performed immediately prior to the procedure. Using sterile technique and 1% Lidocaine as local anesthetic, under stereotactic guidance, a 9 gauge vacuum assisted device was used to perform core needle biopsy of calcifications in the slightly upper inner quadrant of the right breast, posterior depth using a  superior approach. Specimen radiograph was performed showing presence of calcifications. Specimens with calcifications are identified for pathology. At the conclusion of the procedure, a coil shaped tissue marker clip was deployed into the biopsy cavity. Follow-up 2-view mammogram was performed and dictated separately. IMPRESSION: Stereotactic-guided biopsy of right breast calcifications. No apparent complications. Electronically Signed: By: Fidela Salisbury M.D. On: 05/19/2016 14:41      IMPRESSION: High-grade intraductal carcinoma of the right breast. Patient's tumor was ER and PR negative and biopsy suspicious for early stromal invasion. Patient would be a good candidate for breast conservation therapy with surgery followed by adjuvant radiation therapy. I discussed course of treatment side effects and potential toxicities of radiation therapy in this situation with the patient. She appears to understand and wishes to proceed with a course of treatment.  We briefly discussed mastectomy is an alternative to breast conserving therapy but the patient does wish to proceed with breast conservation treatment  PLAN:  Patient will proceed with surgery in the near future direction of Dr. Barry Dienes.  This would include a excisional biopsy/lumpectomy as well as sentinel node procedure given the suspicion of early invasion on  Biopsy. The patient be seen in the postoperative setting for further discussion and evaluation concerning radiation therapy as part of breast conservation treatment.  I   ------------------------------------------------  Blair Promise, PhD, MD

## 2016-05-26 NOTE — Patient Instructions (Signed)

## 2016-05-26 NOTE — Progress Notes (Signed)
Clinical Social Work [place] Psychosocial Distress Screening [place]  Patient completed distress screening protocol and scored a 9 on the Psychosocial Distress Thermometer which indicates severe distress. Clinical Social Worker met with patient and patients husband in [facility] to assess for distress and other psychosocial needs. Patient stated she was feeling overwhelmed and anxious but felt positive after meeting with the treatment team and getting more information on her treatment plan. CSW and patient discussed common feeling and emotions when being diagnosed with cancer, and the importance of support during treatment. CSW informed patient of the support team and support services at [facility]. CSW provided contact information and encouraged patient to call with any questions or concerns.  ONCBCN DISTRESS SCREENING 05/26/2016  Screening Type Initial Screening  Distress experienced in past week (1-10) 9  Emotional problem type Depression;Nervousness/Anxiety  Physical Problem type Sleep/insomnia;Loss of appetitie  Physician notified of physical symptoms Yes     Johnnye Lana, MSW, LCSW, OSW-C Clinical Social Worker [facility] [phone]

## 2016-05-31 ENCOUNTER — Encounter (HOSPITAL_BASED_OUTPATIENT_CLINIC_OR_DEPARTMENT_OTHER): Payer: Self-pay | Admitting: *Deleted

## 2016-05-31 ENCOUNTER — Other Ambulatory Visit: Payer: Self-pay | Admitting: General Surgery

## 2016-05-31 DIAGNOSIS — C50211 Malignant neoplasm of upper-inner quadrant of right female breast: Secondary | ICD-10-CM

## 2016-05-31 DIAGNOSIS — Z171 Estrogen receptor negative status [ER-]: Secondary | ICD-10-CM

## 2016-06-01 ENCOUNTER — Ambulatory Visit
Admission: RE | Admit: 2016-06-01 | Discharge: 2016-06-01 | Disposition: A | Discharge status: Home / Self Care | Payer: Medicare Other | Source: Ambulatory Visit | Attending: General Surgery | Admitting: General Surgery

## 2016-06-01 DIAGNOSIS — Z171 Estrogen receptor negative status [ER-]: Secondary | ICD-10-CM

## 2016-06-01 DIAGNOSIS — C50211 Malignant neoplasm of upper-inner quadrant of right female breast: Secondary | ICD-10-CM

## 2016-06-01 DIAGNOSIS — C50911 Malignant neoplasm of unspecified site of right female breast: Secondary | ICD-10-CM | POA: Diagnosis not present

## 2016-06-01 NOTE — Progress Notes (Signed)
Pt instructed to drink Boost before 0900 day of surgery with teach back method.

## 2016-06-01 NOTE — H&P (Signed)
Dawn West 05/26/2016 11:47 AM Location: [facility] Patient #: [id number] DOB: 1950-12-03 Undefined / Language: Dawn West / Race: White Female   History of Present Illness Dawn Klein MD; 05/31/2016 9:29 AM) The patient is a 66 year old female who presents with breast cancer. Pt is a 66 yo F who presents with screening detected calcifications. She had a 2.2 cm area in the upper inner quadrant of her right breast. Core needle biopsy was performed and demonstrated high grade DCIS with suspicion for invasion. This was ER/PR negative. She is very anxious because she is supposed to get a knee replacement with Dr. Lyla West February 1 and is wondering if this can be taken care of first. She denies breast pain. She has a personal history of basal cell cancer, but no other cancers, and no family history of cancer of which she is aware.   Pathology Diagnosis Breast, right, needle core biopsy, UIQ - DUCTAL CARCINOMA WITH CALCIFICATIONS. - SEE COMMENT.  Microscopic Comment The carcinoma is predominantly in situ and is high grade. There are foci highly suspicious for stromal invasion.  Dx mammogram 05/18/16 FINDINGS: Full field and magnification views of the right breast demonstrate heterogeneous calcifications in a linear orientation within the posterior upper right breast and spanning a distance of 2.2 cm. No associated mass identified.  Mammographic images were processed with CAD.  IMPRESSION: Suspicious calcifications within the upper right breast. Tissue sampling is recommended.   Past Surgical History Dawn Klein, MD; 05/31/2016 9:29 AM) Breast Biopsy  Right. Hysterectomy (not due to cancer) - Partial  Tonsillectomy   Diagnostic Studies History Dawn Klein, MD; 05/31/2016 9:29 AM) Colonoscopy  1-5 years ago Mammogram  within last year Pap Smear  >5 years ago  Social History Dawn Klein, MD; 05/31/2016 9:29 AM) Alcohol use  Occasional alcohol  use. Caffeine use  Coffee, Tea. No drug use  Tobacco use  Never smoker.  Family History Dawn Klein, MD; 05/31/2016 9:29 AM) Alcohol Abuse  Brother, Family Members In General. Arthritis  Father, Mother. Cerebrovascular Accident  Mother. Colon Polyps  Father. Depression  Father. Diabetes Mellitus  Mother. Heart Disease  Mother. Heart disease in female family member before age 68  Heart disease in female family member before age 46  Hypertension  Mother. Kidney Disease  Father. Melanoma  Mother. Thyroid problems  Sister.  Pregnancy / Birth History Dawn Klein, MD; 05/31/2016 9:29 AM) Age at menarche  50 years. Contraceptive History  Oral contraceptives. Gravida  1 Maternal age  40-35 Para  1  Other Problems Dawn Klein, MD; 05/31/2016 9:29 AM) Anxiety Disorder  Arthritis  Depression  Hypercholesterolemia  Melanoma     Review of Systems Dawn Klein MD; 05/31/2016 9:29 AM) General Present- Fatigue. Not Present- Appetite Loss, Chills, Fever, Night Sweats, Weight Gain and Weight Loss. Skin Present- Dryness. Not Present- Change in Wart/Mole, Hives, Jaundice, New Lesions, Non-Healing Wounds, Rash and Ulcer. HEENT Present- Ringing in the Ears and Seasonal Allergies. Not Present- Earache, Hearing Loss, Hoarseness, Nose Bleed, Oral Ulcers, Sinus Pain, Sore Throat, Visual Disturbances, Wears glasses/contact lenses and Yellow Eyes. Respiratory Present- Snoring. Not Present- Bloody sputum, Chronic Cough, Difficulty Breathing and Wheezing. Breast Present- Breast Mass and Breast Pain. Not Present- Nipple Discharge and Skin Changes. Cardiovascular Not Present- Chest Pain, Difficulty Breathing Lying Down, Leg Cramps, Palpitations, Rapid Heart Rate, Shortness of Breath and Swelling of Extremities. Gastrointestinal Present- Bloating and Gets full quickly at meals. Not Present- Abdominal Pain, Bloody Stool, Change in Bowel Habits, Chronic diarrhea,  Constipation,  Difficulty Swallowing, Excessive gas, Hemorrhoids, Indigestion, Nausea, Rectal Pain and Vomiting. Female Genitourinary Not Present- Frequency, Nocturia, Painful Urination, Pelvic Pain and Urgency. Musculoskeletal Not Present- Back Pain, Joint Pain, Joint Stiffness, Muscle Pain, Muscle Weakness and Swelling of Extremities. Neurological Present- Decreased Memory and Trouble walking. Not Present- Fainting, Headaches, Numbness, Seizures, Tingling, Tremor and Weakness. Psychiatric Present- Change in Sleep Pattern, Depression and Frequent crying. Not Present- Anxiety, Bipolar and Fearful. Endocrine Present- Hot flashes. Not Present- Cold Intolerance, Excessive Hunger, Hair Changes, Heat Intolerance and New Diabetes. Hematology Not Present- Blood Thinners, Easy Bruising, Excessive bleeding, Gland problems, HIV and Persistent Infections.   Physical Exam Dawn Klein MD; 05/31/2016 9:32 AM) General Mental Status-Alert. General Appearance-Consistent with stated age. Hydration-Well hydrated. Voice-Normal.  Head and Neck Head-normocephalic, atraumatic with no lesions or palpable masses. Trachea-midline. Thyroid Gland Characteristics - normal size and consistency.  Eye Eyeball - Bilateral-Extraocular movements intact. Sclera/Conjunctiva - Bilateral-No scleral icterus.  Chest and Lung Exam Chest and lung exam reveals -quiet, even and easy respiratory effort with no use of accessory muscles and on auscultation, normal breath sounds, no adventitious sounds and normal vocal resonance. Inspection Chest Wall - Normal. Back - normal.  Breast Note: faint bruising right breast upper inner quadrant. no palpable masses. no LAD. no nipple retraction or skin dimpling. mild ptosis. breasts symmetric bilaterally. no nipple discharge.   Cardiovascular Cardiovascular examination reveals -normal heart sounds, regular rate and rhythm with no murmurs and normal pedal pulses  bilaterally.  Abdomen Inspection Inspection of the abdomen reveals - No Hernias. Palpation/Percussion Palpation and Percussion of the abdomen reveal - Soft, Non Tender, No Rebound tenderness, No Rigidity (guarding) and No hepatosplenomegaly. Auscultation Auscultation of the abdomen reveals - Bowel sounds normal.  Neurologic Neurologic evaluation reveals -alert and oriented x 3 with no impairment of recent or remote memory. Mental Status-Normal.  Musculoskeletal Global Assessment -Note: no gross deformities.  Normal Exam - Left-Upper Extremity Strength Normal and Lower Extremity Strength Normal. Normal Exam - Right-Upper Extremity Strength Normal and Lower Extremity Strength Normal.  Lymphatic Head & Neck  General Head & Neck Lymphatics: Bilateral - Description - Normal. Axillary  General Axillary Region: Bilateral - Description - Normal. Tenderness - Non Tender. Femoral & Inguinal  Generalized Femoral & Inguinal Lymphatics: Bilateral - Description - No Generalized lymphadenopathy.    Assessment & Plan Dawn Klein MD; 05/31/2016 9:35 AM) CARCINOMA OF UPPER-INNER QUADRANT OF RIGHT BREAST IN FEMALE, ESTROGEN RECEPTOR NEGATIVE (C50.211) Impression: Patient has diagnosis of stage 0/early stage I right breast cancer. Amenable to breast conservation. Will plan seed localized right breast lumpectomy with SLN bx. We can do this next week in order to attempt to preserve her date for knee replacement. Path should be back in time to know if she has + LN or + margins. She can delay XRT until 6 weeks post knee replacement in order to get her intense PT done.  The surgical procedure was described to the patient. I discussed the incision type and location and that we would need radiology involved on with a wire or seed marker and/or sentinel node.  The risks and benefits of the procedure were described to the patient and she wishes to proceed.  We discussed the risks bleeding,  infection, damage to other structures, need for further procedures/surgeries. We discussed the risk of seroma. The patient was advised if the area in the breast in cancer, we may need to go back to surgery for additional tissue to obtain negative margins or for  a lymph node biopsy. The patient was advised that these are the most common complications, but that others can occur as well. They were advised against taking aspirin or other anti-inflammatory agents/blood thinners the week before surgery.  I spent significant time with the patient and her husband reviewing the timeline/schedule of surgery. I also discussed the patient with Dr. Lyla West.  I do recommend stopping oral HRT. I have advised the patient to hold asa and fish oil pending surgery.  65 min spent in evaluation, examination, counseling, and coordination of care. >50% spent in counseling. Current Plans Pt Education - flb breast cancer surgery: discussed with patient and provided information.   Signed by Dawn Klein, MD (05/31/2016 9:36 AM)

## 2016-06-02 ENCOUNTER — Ambulatory Visit (HOSPITAL_COMMUNITY)
Admission: RE | Admit: 2016-06-02 | Discharge: 2016-06-02 | Disposition: A | Discharge status: Home / Self Care | Payer: Medicare Other | Source: Ambulatory Visit | Attending: General Surgery | Admitting: General Surgery

## 2016-06-02 ENCOUNTER — Ambulatory Visit (HOSPITAL_BASED_OUTPATIENT_CLINIC_OR_DEPARTMENT_OTHER): Payer: Medicare Other | Admitting: Anesthesiology

## 2016-06-02 ENCOUNTER — Encounter (HOSPITAL_BASED_OUTPATIENT_CLINIC_OR_DEPARTMENT_OTHER)
Admission: RE | Disposition: A | Discharge status: Home / Self Care | Payer: Self-pay | Source: Ambulatory Visit | Attending: General Surgery

## 2016-06-02 ENCOUNTER — Ambulatory Visit (HOSPITAL_BASED_OUTPATIENT_CLINIC_OR_DEPARTMENT_OTHER)
Admission: RE | Admit: 2016-06-02 | Discharge: 2016-06-02 | Disposition: A | Discharge status: Home / Self Care | Payer: Medicare Other | Source: Ambulatory Visit | Attending: General Surgery | Admitting: General Surgery

## 2016-06-02 ENCOUNTER — Ambulatory Visit
Admission: RE | Admit: 2016-06-02 | Discharge: 2016-06-02 | Disposition: A | Discharge status: Home / Self Care | Payer: Medicare Other | Source: Ambulatory Visit | Attending: General Surgery | Admitting: General Surgery

## 2016-06-02 ENCOUNTER — Encounter (HOSPITAL_BASED_OUTPATIENT_CLINIC_OR_DEPARTMENT_OTHER): Payer: Self-pay | Admitting: *Deleted

## 2016-06-02 DIAGNOSIS — C50211 Malignant neoplasm of upper-inner quadrant of right female breast: Secondary | ICD-10-CM

## 2016-06-02 DIAGNOSIS — Z90710 Acquired absence of both cervix and uterus: Secondary | ICD-10-CM | POA: Insufficient documentation

## 2016-06-02 DIAGNOSIS — Z171 Estrogen receptor negative status [ER-]: Secondary | ICD-10-CM | POA: Insufficient documentation

## 2016-06-02 DIAGNOSIS — F419 Anxiety disorder, unspecified: Secondary | ICD-10-CM | POA: Diagnosis not present

## 2016-06-02 DIAGNOSIS — C50911 Malignant neoplasm of unspecified site of right female breast: Secondary | ICD-10-CM | POA: Diagnosis not present

## 2016-06-02 DIAGNOSIS — G8918 Other acute postprocedural pain: Secondary | ICD-10-CM | POA: Diagnosis not present

## 2016-06-02 DIAGNOSIS — F329 Major depressive disorder, single episode, unspecified: Secondary | ICD-10-CM | POA: Diagnosis not present

## 2016-06-02 DIAGNOSIS — E7800 Pure hypercholesterolemia, unspecified: Secondary | ICD-10-CM | POA: Insufficient documentation

## 2016-06-02 DIAGNOSIS — N6011 Diffuse cystic mastopathy of right breast: Secondary | ICD-10-CM | POA: Diagnosis not present

## 2016-06-02 HISTORY — PX: BREAST LUMPECTOMY WITH RADIOACTIVE SEED AND SENTINEL LYMPH NODE BIOPSY: SHX6550

## 2016-06-02 HISTORY — DX: Other complications of anesthesia, initial encounter: T88.59XA

## 2016-06-02 HISTORY — DX: Unspecified osteoarthritis, unspecified site: M19.90

## 2016-06-02 HISTORY — DX: Pure hypercholesterolemia, unspecified: E78.00

## 2016-06-02 HISTORY — DX: Other specified postprocedural states: Z98.890

## 2016-06-02 HISTORY — DX: Other specified postprocedural states: R11.2

## 2016-06-02 HISTORY — DX: Adverse effect of unspecified anesthetic, initial encounter: T41.45XA

## 2016-06-02 SURGERY — BREAST LUMPECTOMY WITH RADIOACTIVE SEED AND SENTINEL LYMPH NODE BIOPSY
Anesthesia: General | Site: Breast | Laterality: Right

## 2016-06-02 MED ORDER — EPHEDRINE SULFATE 50 MG/ML IJ SOLN
INTRAMUSCULAR | Status: DC | PRN
  Administered 2016-06-02: 10 mg via INTRAVENOUS

## 2016-06-02 MED ORDER — ACETAMINOPHEN 500 MG PO TABS
ORAL_TABLET | ORAL | Status: AC
  Filled 2016-06-02: qty 2

## 2016-06-02 MED ORDER — METHYLENE BLUE 0.5 % INJ SOLN
INTRAVENOUS | Status: AC
  Filled 2016-06-02: qty 10

## 2016-06-02 MED ORDER — CHLORHEXIDINE GLUCONATE CLOTH 2 % EX PADS: 6.0000 | MEDICATED_PAD | Freq: Once | CUTANEOUS | Status: DC

## 2016-06-02 MED ORDER — DEXAMETHASONE SODIUM PHOSPHATE 4 MG/ML IJ SOLN
INTRAMUSCULAR | Status: DC | PRN
  Administered 2016-06-02: 10 mg via INTRAVENOUS

## 2016-06-02 MED ORDER — VANCOMYCIN HCL IN DEXTROSE 1-5 GM/200ML-% IV SOLN: 1000.0000 mg | INTRAVENOUS | Status: DC

## 2016-06-02 MED ORDER — POVIDONE-IODINE 10 % EX SWAB: 2.0000 "application " | Freq: Once | CUTANEOUS | Status: DC

## 2016-06-02 MED ORDER — BUPIVACAINE-EPINEPHRINE (PF) 0.5% -1:200000 IJ SOLN
INTRAMUSCULAR | Status: DC | PRN
  Administered 2016-06-02: 30 mL via PERINEURAL

## 2016-06-02 MED ORDER — CLINDAMYCIN PHOSPHATE 600 MG/50ML IV SOLN
INTRAVENOUS | Status: AC
  Filled 2016-06-02: qty 50

## 2016-06-02 MED ORDER — OXYCODONE HCL 5 MG PO TABS
5.0000 mg | ORAL_TABLET | Freq: Once | ORAL | Status: AC | PRN
  Administered 2016-06-02: 5 mg via ORAL

## 2016-06-02 MED ORDER — PROPOFOL 10 MG/ML IV BOLUS
INTRAVENOUS | Status: DC | PRN
  Administered 2016-06-02: 200 mg via INTRAVENOUS

## 2016-06-02 MED ORDER — MIDAZOLAM HCL 5 MG/5ML IJ SOLN
INTRAMUSCULAR | Status: DC | PRN
  Administered 2016-06-02 (×2): 2 mg via INTRAVENOUS

## 2016-06-02 MED ORDER — ACETAMINOPHEN 500 MG PO TABS: 1000.0000 mg | ORAL_TABLET | ORAL | Status: DC

## 2016-06-02 MED ORDER — ACETAMINOPHEN 650 MG RE SUPP: 650.0000 mg | RECTAL | Status: DC | PRN

## 2016-06-02 MED ORDER — MEPERIDINE HCL 25 MG/ML IJ SOLN: 6.2500 mg | INTRAMUSCULAR | Status: DC | PRN

## 2016-06-02 MED ORDER — LACTATED RINGERS IV SOLN: INTRAVENOUS | Status: DC

## 2016-06-02 MED ORDER — OXYCODONE HCL 5 MG PO TABS: 5.0000 mg | ORAL_TABLET | Freq: Four times a day (QID) | ORAL | 0 refills | Status: DC | PRN

## 2016-06-02 MED ORDER — FENTANYL CITRATE (PF) 100 MCG/2ML IJ SOLN
25.0000 ug | INTRAMUSCULAR | Status: DC | PRN
  Administered 2016-06-02 (×2): 25 ug via INTRAVENOUS

## 2016-06-02 MED ORDER — FENTANYL CITRATE (PF) 100 MCG/2ML IJ SOLN
INTRAMUSCULAR | Status: AC
  Filled 2016-06-02: qty 2

## 2016-06-02 MED ORDER — OXYCODONE HCL 5 MG PO TABS
ORAL_TABLET | ORAL | Status: AC
  Filled 2016-06-02: qty 1

## 2016-06-02 MED ORDER — CHLORHEXIDINE GLUCONATE 4 % EX LIQD: 60.0000 mL | Freq: Once | CUTANEOUS | Status: DC

## 2016-06-02 MED ORDER — SODIUM CHLORIDE 0.9% FLUSH: 3.0000 mL | INTRAVENOUS | Status: DC | PRN

## 2016-06-02 MED ORDER — DEXAMETHASONE SODIUM PHOSPHATE 10 MG/ML IJ SOLN
INTRAMUSCULAR | Status: AC
  Filled 2016-06-02: qty 1

## 2016-06-02 MED ORDER — SODIUM CHLORIDE 0.9 % IV SOLN: 250.0000 mL | INTRAVENOUS | Status: DC | PRN

## 2016-06-02 MED ORDER — FENTANYL CITRATE (PF) 100 MCG/2ML IJ SOLN
50.0000 ug | INTRAMUSCULAR | Status: DC | PRN
  Administered 2016-06-02: 100 ug via INTRAVENOUS

## 2016-06-02 MED ORDER — SODIUM CHLORIDE 0.9% FLUSH: 3.0000 mL | Freq: Two times a day (BID) | INTRAVENOUS | Status: DC

## 2016-06-02 MED ORDER — ONDANSETRON HCL 4 MG/2ML IJ SOLN
INTRAMUSCULAR | Status: AC
  Filled 2016-06-02: qty 2

## 2016-06-02 MED ORDER — SCOPOLAMINE 1 MG/3DAYS TD PT72: 1.0000 | MEDICATED_PATCH | Freq: Once | TRANSDERMAL | Status: DC | PRN

## 2016-06-02 MED ORDER — LIDOCAINE 2% (20 MG/ML) 5 ML SYRINGE
INTRAMUSCULAR | Status: AC
  Filled 2016-06-02: qty 5

## 2016-06-02 MED ORDER — ACETAMINOPHEN 10 MG/ML IV SOLN: 1000.0000 mg | INTRAVENOUS | Status: DC

## 2016-06-02 MED ORDER — TECHNETIUM TC 99M SULFUR COLLOID FILTERED
1.0000 | Freq: Once | INTRAVENOUS | Status: AC | PRN
  Administered 2016-06-02: 1 via INTRADERMAL

## 2016-06-02 MED ORDER — LACTATED RINGERS IV SOLN
INTRAVENOUS | Status: DC
  Administered 2016-06-02 (×2): via INTRAVENOUS

## 2016-06-02 MED ORDER — MIDAZOLAM HCL 2 MG/2ML IJ SOLN
INTRAMUSCULAR | Status: AC
  Filled 2016-06-02: qty 2

## 2016-06-02 MED ORDER — SODIUM CHLORIDE 0.9 % IJ SOLN
INTRAVENOUS | Status: DC | PRN
  Administered 2016-06-02: 5 mL via INTRAMUSCULAR

## 2016-06-02 MED ORDER — MIDAZOLAM HCL 2 MG/2ML IJ SOLN
1.0000 mg | INTRAMUSCULAR | Status: DC | PRN
  Administered 2016-06-02: 2 mg via INTRAVENOUS

## 2016-06-02 MED ORDER — LIDOCAINE HCL (CARDIAC) 20 MG/ML IV SOLN
INTRAVENOUS | Status: DC | PRN
  Administered 2016-06-02: 30 mg via INTRAVENOUS

## 2016-06-02 MED ORDER — BUPIVACAINE-EPINEPHRINE (PF) 0.5% -1:200000 IJ SOLN
INTRAMUSCULAR | Status: DC | PRN
  Administered 2016-06-02: 20 mL

## 2016-06-02 MED ORDER — ACETAMINOPHEN 325 MG PO TABS: 650.0000 mg | ORAL_TABLET | ORAL | Status: DC | PRN

## 2016-06-02 MED ORDER — CLINDAMYCIN PHOSPHATE 600 MG/50ML IV SOLN
INTRAVENOUS | Status: DC | PRN
  Administered 2016-06-02: 600 mg via INTRAVENOUS

## 2016-06-02 MED ORDER — OXYCODONE HCL 5 MG PO TABS: 5.0000 mg | ORAL_TABLET | ORAL | Status: DC | PRN

## 2016-06-02 MED ORDER — SODIUM CHLORIDE 0.9 % IJ SOLN
INTRAMUSCULAR | Status: AC
  Filled 2016-06-02: qty 10

## 2016-06-02 MED ORDER — SODIUM CHLORIDE 0.9 % IV SOLN: INTRAVENOUS | Status: DC

## 2016-06-02 MED ORDER — PROPOFOL 10 MG/ML IV BOLUS
INTRAVENOUS | Status: AC
  Filled 2016-06-02: qty 20

## 2016-06-02 MED ORDER — TRANEXAMIC ACID 1000 MG/10ML IV SOLN: 1000.0000 mg | INTRAVENOUS | Status: DC

## 2016-06-02 MED ORDER — METOCLOPRAMIDE HCL 5 MG/ML IJ SOLN: 10.0000 mg | Freq: Once | INTRAMUSCULAR | Status: DC | PRN

## 2016-06-02 SURGICAL SUPPLY — 54 items
ADH SKN CLS APL DERMABOND .7 (GAUZE/BANDAGES/DRESSINGS) ×1
BINDER BREAST XLRG (GAUZE/BANDAGES/DRESSINGS) ×1 IMPLANT
BLADE SURG 10 STRL SS (BLADE) ×2 IMPLANT
BLADE SURG 15 STRL LF DISP TIS (BLADE) ×1 IMPLANT
BLADE SURG 15 STRL SS (BLADE) ×2
BNDG COHESIVE 4X5 TAN STRL (GAUZE/BANDAGES/DRESSINGS) ×2 IMPLANT
CANISTER SUCT 1200ML W/VALVE (MISCELLANEOUS) ×2 IMPLANT
CHLORAPREP W/TINT 26ML (MISCELLANEOUS) ×2 IMPLANT
CLIP TI LARGE 6 (CLIP) ×2 IMPLANT
CLIP TI MEDIUM 6 (CLIP) ×4 IMPLANT
COVER MAYO STAND STRL (DRAPES) ×2 IMPLANT
COVER PROBE W GEL 5X96 (DRAPES) ×2 IMPLANT
DERMABOND ADVANCED (GAUZE/BANDAGES/DRESSINGS) ×1
DERMABOND ADVANCED .7 DNX12 (GAUZE/BANDAGES/DRESSINGS) ×1 IMPLANT
DEVICE DUBIN W/COMP PLATE 8390 (MISCELLANEOUS) ×2 IMPLANT
DRAPE UTILITY XL STRL (DRAPES) ×2 IMPLANT
DRSG PAD ABDOMINAL 8X10 ST (GAUZE/BANDAGES/DRESSINGS) ×1 IMPLANT
ELECT COATED BLADE 2.86 ST (ELECTRODE) ×2 IMPLANT
ELECT REM PT RETURN 9FT ADLT (ELECTROSURGICAL) ×2
ELECTRODE REM PT RTRN 9FT ADLT (ELECTROSURGICAL) ×1 IMPLANT
GLOVE BIO SURGEON STRL SZ 6 (GLOVE) ×2 IMPLANT
GLOVE BIOGEL PI IND STRL 6.5 (GLOVE) ×1 IMPLANT
GLOVE BIOGEL PI IND STRL 7.5 (GLOVE) IMPLANT
GLOVE BIOGEL PI INDICATOR 6.5 (GLOVE) ×1
GLOVE BIOGEL PI INDICATOR 7.5 (GLOVE) ×1
GLOVE SURG SS PI 7.5 STRL IVOR (GLOVE) ×1 IMPLANT
GOWN STRL REUS W/ TWL LRG LVL3 (GOWN DISPOSABLE) ×1 IMPLANT
GOWN STRL REUS W/TWL 2XL LVL3 (GOWN DISPOSABLE) ×2 IMPLANT
GOWN STRL REUS W/TWL LRG LVL3 (GOWN DISPOSABLE) ×4
KIT MARKER MARGIN INK (KITS) ×2 IMPLANT
LIGHT WAVEGUIDE WIDE FLAT (MISCELLANEOUS) ×1 IMPLANT
NDL HYPO 25X1 1.5 SAFETY (NEEDLE) ×1 IMPLANT
NDL SAFETY ECLIPSE 18X1.5 (NEEDLE) IMPLANT
NEEDLE HYPO 18GX1.5 SHARP (NEEDLE) ×2
NEEDLE HYPO 25X1 1.5 SAFETY (NEEDLE) ×4 IMPLANT
NS IRRIG 1000ML POUR BTL (IV SOLUTION) ×2 IMPLANT
PACK BASIN DAY SURGERY FS (CUSTOM PROCEDURE TRAY) ×2 IMPLANT
PACK UNIVERSAL I (CUSTOM PROCEDURE TRAY) ×2 IMPLANT
PENCIL BUTTON HOLSTER BLD 10FT (ELECTRODE) ×2 IMPLANT
SLEEVE SCD COMPRESS KNEE MED (MISCELLANEOUS) ×2 IMPLANT
SPONGE GAUZE 4X4 12PLY STER LF (GAUZE/BANDAGES/DRESSINGS) ×3 IMPLANT
SPONGE LAP 18X18 X RAY DECT (DISPOSABLE) ×4 IMPLANT
STOCKINETTE IMPERVIOUS LG (DRAPES) ×2 IMPLANT
STRIP CLOSURE SKIN 1/2X4 (GAUZE/BANDAGES/DRESSINGS) ×2 IMPLANT
SUT MNCRL AB 4-0 PS2 18 (SUTURE) ×2 IMPLANT
SUT SILK 2 0 SH (SUTURE) IMPLANT
SUT VIC AB 2-0 SH 27 (SUTURE) ×2
SUT VIC AB 2-0 SH 27XBRD (SUTURE) ×1 IMPLANT
SUT VIC AB 3-0 SH 27 (SUTURE) ×4
SUT VIC AB 3-0 SH 27X BRD (SUTURE) ×1 IMPLANT
SYR CONTROL 10ML LL (SYRINGE) ×3 IMPLANT
TOWEL OR 17X24 6PK STRL BLUE (TOWEL DISPOSABLE) ×2 IMPLANT
TUBE CONNECTING 20X1/4 (TUBING) ×2 IMPLANT
YANKAUER SUCT BULB TIP NO VENT (SUCTIONS) ×2 IMPLANT

## 2016-06-02 NOTE — Op Note (Signed)
Right Breast Radioactive seed localized lumpectomy, lymphatic mapping, and sentinel lymph node biopsy  Indications: This patient presents with history of right breast cancer, cTis, ER/PR negative  Pre-operative Diagnosis: right breast cancer, upper inner quadrant  Post-operative Diagnosis: Same  Surgeon: [name]   Anesthesia: General endotracheal anesthesia  ASA Class: 2  Procedure Details  The patient was seen in the Holding Room. The risks, benefits, complications, treatment options, and expected outcomes were discussed with the patient. The possibilities of bleeding, infection, the need for additional procedures, failure to diagnose a condition, and creating a complication requiring transfusion or operation were discussed with the patient. The patient concurred with the proposed plan, giving informed consent.  The site of surgery properly noted/marked. The patient was taken to Operating Room # 8, identified, and the procedure verified as Right Breast Seed localized Lumpectomy with sentinel lymph node biopsy. A Time Out was held and the above information confirmed. Methylene blue was injected into the subareolar position.  The right arm, breast, and chest were prepped and draped in standard fashion. The lumpectomy was performed by creating circumareolar incision over the previously placed radioactive seed.  Dissection was carried down to around the point of maximum signal intensity. The cautery was used to perform the dissection.  Hemostasis was achieved with cautery. The edges of the cavity were marked with large clips, with one each medial, lateral, inferior and superior, and two clips posteriorly.   The specimen was inked with the margin marker paint kit.    Specimen radiography confirmed inclusion of the mammographic lesion, the clip, and the seed.  The background signal in the breast was zero.  The seed and clip were close to the medial margin, so additional tissue was taken medially.   The wound was irrigated and closed with 3-0 vicryl in layers and 4-0 monocryl subcuticular suture.    Using a hand-held gamma probe, right axillary sentinel nodes were identified transcutaneously.  An oblique incision was created below the axillary hairline.  Dissection was carried through the clavipectoral fascia.  Two level 2 (deep) axillary sentinel nodes were removed.  Counts per second were 0 (blue) and 80.    The background count was 0 cps.  The wound was irrigated.  Hemostasis was achieved with cautery.  The axillary incision was closed with a 3-0 vicryl deep dermal interrupted sutures and a 4-0 monocryl subcuticular closure.    Sterile dressings were applied. At the end of the operation, all sponge, instrument, and needle counts were correct.  Findings: grossly clear surgical margins and no adenopathy.  Posterior margin is pectoralis.    Estimated Blood Loss:  min         Specimens: Right breast lumpectomy with seed, additional medial margin and two axillary sentinel lymph nodes.             Complications:  None; patient tolerated the procedure well.         Disposition: PACU - hemodynamically stable.         Condition: stable

## 2016-06-02 NOTE — Transfer of Care (Signed)
Immediate Anesthesia Transfer of Care Note  Patient: Dawn West  Procedure(s) Performed: Procedure(s): RIGHT BREAST LUMPECTOMY WITH RADIOACTIVE SEED AND SENTINEL LYMPH NODE BIOPSY (Right)  Patient Location: PACU  Anesthesia Type:GA combined with regional for post-op pain  Level of Consciousness: awake and patient cooperative  Airway & Oxygen Therapy: Patient Spontanous Breathing and Patient connected to face mask oxygen  Post-op Assessment: Report given to RN and Post -op Vital signs reviewed and stable  Post vital signs: Reviewed and stable  Last Vitals:  Vitals:   06/02/16 1249 06/02/16 1250  BP:  (!) 143/70  Pulse: 90 85  Resp: 19 14  Temp:      Last Pain:  Vitals:   06/02/16 1143  TempSrc: Oral         Complications: No apparent anesthesia complications

## 2016-06-02 NOTE — Progress Notes (Signed)
nuc med staff performed nuc med inj. No additional sedation required. Pt tol well, VSS. Will call husband to bedside and update/provide emotional suppport

## 2016-06-02 NOTE — Progress Notes (Signed)
Assisted Dr. Carignan with right, ultrasound guided, pectoralis block. Side rails up, monitors on throughout procedure. See vital signs in flow sheet. Tolerated Procedure well. 

## 2016-06-02 NOTE — Anesthesia Procedure Notes (Addendum)
Anesthesia Regional Block:  Pectoralis block  Pre-Anesthetic Checklist: ,, timeout performed, Correct Patient, Correct Site, Correct Laterality, Correct Procedure, Correct Position, site marked, Risks and benefits discussed,  Surgical consent,  Pre-op evaluation,  At surgeon's request and post-op pain management  Laterality: Right  Prep: Maximum Sterile Barrier Precautions used, chloraprep       Needles:  Injection technique: Single-shot  Needle Type: Echogenic Stimulator Needle     Needle Length: 10cm 10 cm Needle Gauge: 21 G    Additional Needles:  Procedures: ultrasound guided (picture in chart) Pectoralis block Narrative:  Start time: 06/02/2016 12:28 PM End time: 06/02/2016 12:33 PM Injection made incrementally with aspirations every 5 mL.  Performed by: Personally  Anesthesiologist: Montez Hageman  Additional Notes: Risks, benefits and alternative to block explained extensively.  Patient tolerated procedure well, without complications.

## 2016-06-02 NOTE — Interval H&P Note (Signed)
History and Physical Interval Note:  06/02/2016 1:00 PM  Dawn West  has presented today for surgery, with the diagnosis of RIGHT BREAST CANCER  The various methods of treatment have been discussed with the patient and family. After consideration of risks, benefits and other options for treatment, the patient has consented to  Procedure(s): RIGHT BREAST LUMPECTOMY WITH RADIOACTIVE SEED AND SENTINEL LYMPH NODE BIOPSY (Right) as a surgical intervention .  The patient's history has been reviewed, patient examined, no change in status, stable for surgery.  I have reviewed the patient's chart and labs.  Questions were answered to the patient's satisfaction.     [name]

## 2016-06-02 NOTE — Interval H&P Note (Signed)
History and Physical Interval Note:  06/02/2016 12:55 PM  Dawn West  has presented today for surgery, with the diagnosis of RIGHT BREAST CANCER  The various methods of treatment have been discussed with the patient and family. After consideration of risks, benefits and other options for treatment, the patient has consented to  Procedure(s): RIGHT BREAST LUMPECTOMY WITH RADIOACTIVE SEED AND SENTINEL LYMPH NODE BIOPSY (Right) as a surgical intervention .  The patient's history has been reviewed, patient examined, no change in status, stable for surgery.  I have reviewed the patient's chart and labs.  Questions were answered to the patient's satisfaction.     Enrico Eaddy

## 2016-06-02 NOTE — Anesthesia Preprocedure Evaluation (Signed)
Anesthesia Evaluation  Patient identified by MRN, date of birth, ID band Patient awake    Reviewed: Allergy & Precautions, NPO status , Patient's Chart, lab work & pertinent test results  History of Anesthesia Complications (+) PONV  Airway Mallampati: II  TM Distance: >3 FB Neck ROM: Full    Dental no notable dental hx.    Pulmonary neg pulmonary ROS,    Pulmonary exam normal breath sounds clear to auscultation       Cardiovascular negative cardio ROS Normal cardiovascular exam Rhythm:Regular Rate:Normal     Neuro/Psych negative neurological ROS  negative psych ROS   GI/Hepatic negative GI ROS, Neg liver ROS,   Endo/Other  negative endocrine ROS  Renal/GU negative Renal ROS  negative genitourinary   Musculoskeletal negative musculoskeletal ROS (+)   Abdominal   Peds negative pediatric ROS (+)  Hematology negative hematology ROS (+)   Anesthesia Other Findings   Reproductive/Obstetrics negative OB ROS                            Anesthesia Physical Anesthesia Plan  ASA: II  Anesthesia Plan: General   Post-op Pain Management:  Regional for Post-op pain   Induction: Intravenous  Airway Management Planned: LMA  Additional Equipment:   Intra-op Plan:   Post-operative Plan: Extubation in OR  Informed Consent: I have reviewed the patients History and Physical, chart, labs and discussed the procedure including the risks, benefits and alternatives for the proposed anesthesia with the patient or authorized representative who has indicated his/her understanding and acceptance.   Dental advisory given  Plan Discussed with: CRNA  Anesthesia Plan Comments: (Pec block)        Anesthesia Quick Evaluation

## 2016-06-02 NOTE — Anesthesia Postprocedure Evaluation (Signed)
Anesthesia Post Note  Patient: KAMIYLAH MEECE  Procedure(s) Performed: Procedure(s) (LRB): RIGHT BREAST LUMPECTOMY WITH RADIOACTIVE SEED AND SENTINEL LYMPH NODE BIOPSY (Right)  Patient location during evaluation: PACU Anesthesia Type: General and Regional Level of consciousness: awake and alert Pain management: pain level controlled Vital Signs Assessment: post-procedure vital signs reviewed and stable Respiratory status: spontaneous breathing, nonlabored ventilation, respiratory function stable and patient connected to nasal cannula oxygen Cardiovascular status: blood pressure returned to baseline and stable Postop Assessment: no signs of nausea or vomiting Anesthetic complications: no       Last Vitals:  Vitals:   06/02/16 1515 06/02/16 1530  BP: (!) 143/75 (!) 131/59  Pulse: 79 81  Resp: 13 15  Temp:      Last Pain:  Vitals:   06/02/16 1530  TempSrc:   PainSc: 3                  Montez Hageman

## 2016-06-02 NOTE — Anesthesia Procedure Notes (Signed)
Procedure Name: LMA Insertion Date/Time: 06/02/2016 1:17 PM Performed by: Toula Moos L Pre-anesthesia Checklist: Patient identified, Emergency Drugs available, Suction available, Patient being monitored and Timeout performed Patient Re-evaluated:Patient Re-evaluated prior to inductionOxygen Delivery Method: Circle system utilized Preoxygenation: Pre-oxygenation with 100% oxygen Intubation Type: IV induction Ventilation: Mask ventilation without difficulty LMA: LMA inserted LMA Size: 4.0 Number of attempts: 1 Airway Equipment and Method: Bite block Placement Confirmation: positive ETCO2 Tube secured with: Tape Dental Injury: Teeth and Oropharynx as per pre-operative assessment

## 2016-06-02 NOTE — Discharge Instructions (Addendum)
[facility] °Office Phone Number [phone] ° °BREAST BIOPSY/ PARTIAL MASTECTOMY: POST OP INSTRUCTIONS ° °Always review your discharge instruction sheet given to you by the facility where your surgery was performed. ° °IF YOU HAVE DISABILITY OR FAMILY LEAVE FORMS, YOU MUST BRING THEM TO THE OFFICE FOR PROCESSING.  DO NOT GIVE THEM TO YOUR DOCTOR. ° °1. A prescription for pain medication may be given to you upon discharge.  Take your pain medication as prescribed, if needed.  If narcotic pain medicine is not needed, then you may take acetaminophen (Tylenol) or ibuprofen (Advil) as needed. °2. Take your usually prescribed medications unless otherwise directed °3. If you need a refill on your pain medication, please contact your pharmacy.  They will contact our office to request authorization.  Prescriptions will not be filled after 5pm or on week-ends. °4. You should eat very light the first 24 hours after surgery, such as soup, crackers, pudding, etc.  Resume your normal diet the day after surgery. °5. Most patients will experience some swelling and bruising in the breast.  Ice packs and a good support bra will help.  Swelling and bruising can take several days to resolve.  °6. It is common to experience some constipation if taking pain medication after surgery.  Increasing fluid intake and taking a stool softener will usually help or prevent this problem from occurring.  A mild laxative (Milk of Magnesia or Miralax) should be taken according to package directions if there are no bowel movements after 48 hours. °7. Unless discharge instructions indicate otherwise, you may remove your bandages 48 hours after surgery, and you may shower at that time.  You may have steri-strips (small skin tapes) in place directly over the incision.  These strips should be left on the skin for 7-10 days.   Any sutures or staples will be removed at the office during your follow-up visit. °8. ACTIVITIES:  You may resume  regular daily activities (gradually increasing) beginning the next day.  Wearing a good support bra or sports bra (or the breast binder) minimizes pain and swelling.  You may have sexual intercourse when it is comfortable. °a. You may drive when you no longer are taking prescription pain medication, you can comfortably wear a seatbelt, and you can safely maneuver your car and apply brakes. °b. RETURN TO WORK:  __________1 week_______________ °9. You should see your doctor in the office for a follow-up appointment approximately two weeks after your surgery.  Your doctor’s nurse will typically make your follow-up appointment when she calls you with your pathology report.  Expect your pathology report 2-3 business days after your surgery.  You may call to check if you do not hear from us after three days. ° ° °WHEN TO CALL YOUR DOCTOR: °1. Fever over 101.0 °2. Nausea and/or vomiting. °3. Extreme swelling or bruising. °4. Continued bleeding from incision. °5. Increased pain, redness, or drainage from the incision. ° °The clinic staff is available to answer your questions during regular business hours.  Please don’t hesitate to call and ask to speak to one of the nurses for clinical concerns.  If you have a medical emergency, go to the nearest emergency room or call 911.  A surgeon from [facility] is always on call at the hospital. ° °For further questions, please visit [email or web address]  ° ° °Post Anesthesia Home Care Instructions ° °Activity: °Get plenty of rest for the remainder of the day. A responsible adult should stay with you for 24   hours following the procedure.  °For the next 24 hours, DO NOT: °-Drive a car °-Operate machinery °-Drink alcoholic beverages °-Take any medication unless instructed by your physician °-Make any legal decisions or sign important papers. ° °Meals: °Start with liquid foods such as gelatin or soup. Progress to regular foods as tolerated. Avoid greasy, spicy, heavy  foods. If nausea and/or vomiting occur, drink only clear liquids until the nausea and/or vomiting subsides. Call your physician if vomiting continues. ° °Special Instructions/Symptoms: °Your throat may feel dry or sore from the anesthesia or the breathing tube placed in your throat during surgery. If this causes discomfort, gargle with warm salt water. The discomfort should disappear within 24 hours. ° °If you had a scopolamine patch placed behind your ear for the management of post- operative nausea and/or vomiting: ° °1. The medication in the patch is effective for 72 hours, after which it should be removed.  Wrap patch in a tissue and discard in the trash. Wash hands thoroughly with soap and water. °2. You may remove the patch earlier than 72 hours if you experience unpleasant side effects which may include dry mouth, dizziness or visual disturbances. °3. Avoid touching the patch. Wash your hands with soap and water after contact with the patch. °  ° °

## 2016-06-03 ENCOUNTER — Ambulatory Visit: Payer: Self-pay | Admitting: Orthopedic Surgery

## 2016-06-03 ENCOUNTER — Encounter (HOSPITAL_BASED_OUTPATIENT_CLINIC_OR_DEPARTMENT_OTHER): Payer: Self-pay | Admitting: General Surgery

## 2016-06-03 NOTE — H&P (Signed)
TOTAL KNEE ADMISSION H&P  Patient is being admitted for left total knee arthroplasty.  Subjective:  Chief Complaint:left knee pain.  HPI: Dawn West, 66 y.o. female, has a history of pain and functional disability in the left knee due to arthritis and has failed non-surgical conservative treatments for greater than 12 weeks to includeNSAID's and/or analgesics, corticosteriod injections, viscosupplementation injections, flexibility and strengthening excercises, supervised PT with diminished ADL's post treatment, use of assistive devices, weight reduction as appropriate and activity modification.  Onset of symptoms was abrupt, starting 1 years ago with rapidlly worsening course since that time. The patient noted no past surgery on the left knee(s).  Patient currently rates pain in the left knee(s) at 10 out of 10 with activity. Patient has night pain, worsening of pain with activity and weight bearing, pain that interferes with activities of daily living, pain with passive range of motion, crepitus and joint swelling.  Patient has evidence of subchondral cysts, subchondral sclerosis, periarticular osteophytes, joint subluxation and joint space narrowing by imaging studies. There is no active infection.  Patient Active Problem List   Diagnosis Date Noted  . Ductal carcinoma in situ (DCIS) of right breast 05/25/2016   Past Medical History:  Diagnosis Date  . Anxiety   . Arthritis    knees  . Cancer ([place]) 12/01/11   basal cell skin ca Left upper arm  . Complication of anesthesia   . Depression   . Hypercholesteremia   . PONV (postoperative nausea and vomiting)     Past Surgical History:  Procedure Laterality Date  . ABDOMINAL HYSTERECTOMY    . BREAST LUMPECTOMY WITH RADIOACTIVE SEED AND SENTINEL LYMPH NODE BIOPSY Right 06/02/2016   Procedure: RIGHT BREAST LUMPECTOMY WITH RADIOACTIVE SEED AND SENTINEL LYMPH NODE BIOPSY;  Surgeon: [name], MD;  Location: [facility];   Service: General;  Laterality: Right;  . MOUTH SURGERY    . SKIN CANCER EXCISION Left 2013   upper arm, basal cell  . TONSILLECTOMY AND ADENOIDECTOMY    . TOTAL VAGINAL HYSTERECTOMY  12/1996   menorrhagia, fibroids     (Not in a hospital admission) Allergies  Allergen Reactions  . Allegra [Fexofenadine Hcl]     Abdominal pain  . Ciprofloxacin   . Estradiol Other (See Comments)    Pt is too no longer receive any hormonal therapy due to breast cancer  . Penicillins Rash  . Sulfa Antibiotics Rash    Social History  Substance Use Topics  . Smoking status: Never Smoker  . Smokeless tobacco: Never Used  . Alcohol use 0.0 oz/week     Comment: 3 times yearly    Family History  Problem Relation Age of Onset  . Hypertension Mother   . Heart failure Mother   . Diabetes Mother   . Stroke Mother   . COPD Mother   . Hyperlipidemia Mother   . Hypertension Father   . Heart failure Father   . Hypertension Brother   . Hypertension Sister   . Hyperlipidemia Sister   . Hypertension Brother   . Hypertension Brother   . Diabetes Brother      Review of Systems  Constitutional: Positive for malaise/fatigue. Negative for chills, diaphoresis, fever and weight loss.  HENT: Positive for tinnitus. Negative for congestion, ear discharge, ear pain, hearing loss, nosebleeds, sinus pain and sore throat.   Eyes: Negative.   Respiratory: Negative.  Negative for stridor.   Cardiovascular: Negative.   Gastrointestinal: Negative.   Genitourinary: Negative.  Musculoskeletal: Positive for joint pain.  Skin: Negative.   Neurological: Negative.  Negative for weakness.  Endo/Heme/Allergies: Positive for environmental allergies.  Psychiatric/Behavioral: Positive for memory loss.    Objective:  Physical Exam  Vitals reviewed. Constitutional: She is oriented to person, place, and time. She appears well-developed and well-nourished.  HENT:  Head: Normocephalic and atraumatic.  Eyes: Conjunctivae  and EOM are normal. Pupils are equal, round, and reactive to light.  Neck: Normal range of motion. Neck supple.  Cardiovascular: Normal rate, regular rhythm and intact distal pulses.   Respiratory: Effort normal. No respiratory distress.  GI: Soft. She exhibits no distension.  Genitourinary:  Genitourinary Comments: deferred  Musculoskeletal:       Left knee: She exhibits decreased range of motion, swelling, deformity and abnormal alignment. Tenderness found. Medial joint line tenderness noted.  Neurological: She is alert and oriented to person, place, and time. She has normal reflexes.  Skin: Skin is warm and dry.  Psychiatric: She has a normal mood and affect. Her behavior is normal. Judgment and thought content normal.    Vital signs in last 24 hours: @VSRANGES @  Labs:   Estimated body mass index is 26.93 kg/m as calculated from the following:   Height as of 06/02/16: 5\' 3"  (1.6 m).   Weight as of 06/02/16: 68.9 kg (152 lb).   Imaging Review Plain radiographs demonstrate severe degenerative joint disease of the left knee(s). The overall alignment issignificant varus. The bone quality appears to be adequate for age and reported activity level.  Assessment/Plan:  End stage arthritis, left knee   The patient history, physical examination, clinical judgment of the provider and imaging studies are consistent with end stage degenerative joint disease of the left knee(s) and total knee arthroplasty is deemed medically necessary. The treatment options including medical management, injection therapy arthroscopy and arthroplasty were discussed at length. The risks and benefits of total knee arthroplasty were presented and reviewed. The risks due to aseptic loosening, infection, stiffness, patella tracking problems, thromboembolic complications and other imponderables were discussed. The patient acknowledged the explanation, agreed to proceed with the plan and consent was signed. Patient is  being admitted for inpatient treatment for surgery, pain control, PT, OT, prophylactic antibiotics, VTE prophylaxis, progressive ambulation and ADL's and discharge planning. The patient is planning to be discharged home with outpatient PT. Had lumpectomy on 1/24 - will proceed with surgery if lymph node bx (-)

## 2016-06-03 NOTE — Patient Instructions (Addendum)
Dawn West  06/03/2016   Your procedure is scheduled on: Thursday 06/10/2016  Report to [facility] Main  Entrance take [place]  elevators to 3rd floor to  [place] at   145  PM.  Call this number if you have problems the morning of surgery [phone]   Remember: ONLY 1 PERSON MAY GO WITH YOU TO SHORT STAY TO GET  READY MORNING OF [place].    Do not eat food  :After Midnight.  MAY HAVE CLEAR LIQUIDS FROM MIDNIGHT UP UNTIL 0945 AM THEN NOTHING UNTIL AFTER SURGERY!      CLEAR LIQUID DIET   Foods Allowed                                                                     Foods Excluded  Coffee and tea, regular and decaf                             liquids that you cannot  Plain Jell-O in any flavor                                             see through such as: Fruit ices (not with fruit pulp)                                     milk, soups, orange juice  Iced Popsicles                                    All solid food Carbonated beverages, regular and diet                                    Cranberry, grape and apple juices Sports drinks like Gatorade Lightly seasoned clear broth or consume(fat free) Sugar, honey syrup  Sample Menu Breakfast                                Lunch                                     Supper Cranberry juice                    Beef broth                            Chicken broth Jell-O                                     Grape juice  Apple juice Coffee or tea                        Jell-O                                      Popsicle                                                Coffee or tea                        Coffee or tea  _____________________________________________________________________    Take these medicines the morning of surgery with A SIP OF WATER:, Flonase nasal spray if needed, Oxycodone if needed                                You may not have any metal on your  body including hair pins and              piercings  Do not wear jewelry, make-up, lotions, powders or perfumes, deodorant             Do not wear nail polish.  Do not shave  48 hours prior to surgery.              Men may shave face and neck.   Do not bring valuables to the hospital. [place].  Contacts, dentures or bridgework may not be worn into surgery.  Leave suitcase in the car. After surgery it may be brought to your room.                  Please read over the following fact sheets you were given: _____________________________________________________________________             [facility] - Preparing for Surgery Before surgery, you can play an important role.  Because skin is not sterile, your skin needs to be as free of germs as possible.  You can reduce the number of germs on your skin by washing with CHG (chlorahexidine gluconate) soap before surgery.  CHG is an antiseptic cleaner which kills germs and bonds with the skin to continue killing germs even after washing. Please DO NOT use if you have an allergy to CHG or antibacterial soaps.  If your skin becomes reddened/irritated stop using the CHG and inform your nurse when you arrive at Short Stay. Do not shave (including legs and underarms) for at least 48 hours prior to the first CHG shower.  You may shave your face/neck. Please follow these instructions carefully:  1.  Shower with CHG Soap the night before surgery and the  morning of Surgery.  2.  If you choose to wash your hair, wash your hair first as usual with your  normal  shampoo.  3.  After you shampoo, rinse your hair and body thoroughly to remove the  shampoo.                           4.  Use  CHG as you would any other liquid soap.  You can apply chg directly  to the skin and wash                       Gently with a scrungie or clean washcloth.  5.  Apply the CHG Soap to your body ONLY FROM THE NECK DOWN.   Do not  use on face/ open                           Wound or open sores. Avoid contact with eyes, ears mouth and genitals (private parts).                       Wash face,  Genitals (private parts) with your normal soap.             6.  Wash thoroughly, paying special attention to the area where your surgery  will be performed.  7.  Thoroughly rinse your body with warm water from the neck down.  8.  DO NOT shower/wash with your normal soap after using and rinsing off  the CHG Soap.                9.  Pat yourself dry with a clean towel.            10.  Wear clean pajamas.            11.  Place clean sheets on your bed the night of your first shower and do not  sleep with pets. Day of Surgery : Do not apply any lotions/deodorants the morning of surgery.  Please wear clean clothes to the hospital/surgery center.  FAILURE TO FOLLOW THESE INSTRUCTIONS MAY RESULT IN THE CANCELLATION OF YOUR SURGERY PATIENT SIGNATURE_________________________________  NURSE SIGNATURE__________________________________  ________________________________________________________________________   [name]  An incentive spirometer is a tool that can help keep your lungs clear and active. This tool measures how well you are filling your lungs with each breath. Taking long deep breaths may help reverse or decrease the chance of developing breathing (pulmonary) problems (especially infection) following:  A long period of time when you are unable to move or be active. BEFORE THE PROCEDURE   If the spirometer includes an indicator to show your best effort, your nurse or respiratory therapist will set it to a desired goal.  If possible, sit up straight or lean slightly forward. Try not to slouch.  Hold the incentive spirometer in an upright position. INSTRUCTIONS FOR USE  1. Sit on the edge of your bed if possible, or sit up as far as you can in bed or on a chair. 2. Hold the incentive spirometer in an upright  position. 3. Breathe out normally. 4. Place the mouthpiece in your mouth and seal your lips tightly around it. 5. Breathe in slowly and as deeply as possible, raising the piston or the ball toward the top of the column. 6. Hold your breath for 3-5 seconds or for as long as possible. Allow the piston or ball to fall to the bottom of the column. 7. Remove the mouthpiece from your mouth and breathe out normally. 8. Rest for a few seconds and repeat Steps 1 through 7 at least 10 times every 1-2 hours when you are awake. Take your time and take a few normal breaths between deep breaths. 9. The spirometer may include an indicator to show your best  effort. Use the indicator as a goal to work toward during each repetition. 10. After each set of 10 deep breaths, practice coughing to be sure your lungs are clear. If you have an incision (the cut made at the time of surgery), support your incision when coughing by placing a pillow or rolled up towels firmly against it. Once you are able to get out of bed, walk around indoors and cough well. You may stop using the incentive spirometer when instructed by your caregiver.  RISKS AND COMPLICATIONS  Take your time so you do not get dizzy or light-headed.  If you are in pain, you may need to take or ask for pain medication before doing incentive spirometry. It is harder to take a deep breath if you are having pain. AFTER USE  Rest and breathe slowly and easily.  It can be helpful to keep track of a log of your progress. Your caregiver can provide you with a simple table to help with this. If you are using the spirometer at home, follow these instructions: [place] IF:   You are having difficultly using the spirometer.  You have trouble using the spirometer as often as instructed.  Your pain medication is not giving enough relief while using the spirometer.  You develop fever of 100.5 F (38.1 C) or higher. SEEK IMMEDIATE MEDICAL CARE IF:    You cough up bloody sputum that had not been present before.  You develop fever of 102 F (38.9 C) or greater.  You develop worsening pain at or near the incision site. MAKE SURE YOU:   Understand these instructions.  Will watch your condition.  Will get help right away if you are not doing well or get worse. Document Released: 09/06/2006 Document Revised: 07/19/2011 Document Reviewed: 11/07/2006 ExitCare Patient Information 2014 Exit[employer], [place].   ________________________________________________________________________  WHAT IS A BLOOD TRANSFUSION? Blood Transfusion Information  A transfusion is the replacement of blood or some of its parts. Blood is made up of multiple cells which provide different functions.  Red blood cells carry oxygen and are used for blood loss replacement.  White blood cells fight against infection.  Platelets control bleeding.  Plasma helps clot blood.  Other blood products are available for specialized needs, such as hemophilia or other clotting disorders. BEFORE THE TRANSFUSION  Who gives blood for transfusions?   Healthy volunteers who are fully evaluated to make sure their blood is safe. This is blood bank blood. Transfusion therapy is the safest it has ever been in the practice of medicine. Before blood is taken from a donor, a complete history is taken to make sure that person has no history of diseases nor engages in risky social behavior (examples are intravenous drug use or sexual activity with multiple partners). The donor's travel history is screened to minimize risk of transmitting infections, such as malaria. The donated blood is tested for signs of infectious diseases, such as HIV and hepatitis. The blood is then tested to be sure it is compatible with you in order to minimize the chance of a transfusion reaction. If you or a relative donates blood, this is often done in anticipation of surgery and is not appropriate for emergency  situations. It takes many days to process the donated blood. RISKS AND COMPLICATIONS Although transfusion therapy is very safe and saves many lives, the main dangers of transfusion include:   Getting an infectious disease.  Developing a transfusion reaction. This is an allergic reaction to something in  the blood you were given. Every precaution is taken to prevent this. The decision to have a blood transfusion has been considered carefully by your caregiver before blood is given. Blood is not given unless the benefits outweigh the risks. AFTER THE TRANSFUSION  Right after receiving a blood transfusion, you will usually feel much better and more energetic. This is especially true if your red blood cells have gotten low (anemic). The transfusion raises the level of the red blood cells which carry oxygen, and this usually causes an energy increase.  The nurse administering the transfusion will monitor you carefully for complications. HOME CARE INSTRUCTIONS  No special instructions are needed after a transfusion. You may find your energy is better. Speak with your caregiver about any limitations on activity for underlying diseases you may have. SEEK MEDICAL CARE IF:   Your condition is not improving after your transfusion.  You develop redness or irritation at the intravenous (IV) site. SEEK IMMEDIATE MEDICAL CARE IF:  Any of the following symptoms occur over the next 12 hours:  Shaking chills.  You have a temperature by mouth above 102 F (38.9 C), not controlled by medicine.  Chest, back, or muscle pain.  People around you feel you are not acting correctly or are confused.  Shortness of breath or difficulty breathing.  Dizziness and fainting.  You get a rash or develop hives.  You have a decrease in urine output.  Your urine turns a dark color or changes to pink, red, or brown. Any of the following symptoms occur over the next 10 days:  You have a temperature by mouth above  102 F (38.9 C), not controlled by medicine.  Shortness of breath.  Weakness after normal activity.  The white part of the eye turns yellow (jaundice).  You have a decrease in the amount of urine or are urinating less often.  Your urine turns a dark color or changes to pink, red, or brown. Document Released: 04/23/2000 Document Revised: 07/19/2011 Document Reviewed: 12/11/2007 [facility] Patient Information 2014 [place], [place].  _______________________________________________________________________

## 2016-06-04 ENCOUNTER — Telehealth: Payer: Self-pay | Admitting: *Deleted

## 2016-06-04 ENCOUNTER — Encounter: Payer: Self-pay | Admitting: Radiation Oncology

## 2016-06-04 ENCOUNTER — Encounter (HOSPITAL_COMMUNITY)
Admission: RE | Admit: 2016-06-04 | Discharge: 2016-06-04 | Disposition: A | Discharge status: Home / Self Care | Payer: Medicare Other | Source: Ambulatory Visit | Attending: Orthopedic Surgery | Admitting: Orthopedic Surgery

## 2016-06-04 ENCOUNTER — Ambulatory Visit: Payer: Self-pay | Admitting: Orthopedic Surgery

## 2016-06-04 ENCOUNTER — Encounter (HOSPITAL_COMMUNITY): Payer: Self-pay

## 2016-06-04 DIAGNOSIS — M1712 Unilateral primary osteoarthritis, left knee: Secondary | ICD-10-CM | POA: Insufficient documentation

## 2016-06-04 DIAGNOSIS — Z01818 Encounter for other preprocedural examination: Secondary | ICD-10-CM | POA: Insufficient documentation

## 2016-06-04 LAB — CBC
HEMATOCRIT: 34.6 % — AB (ref 36.0–46.0)
HEMOGLOBIN: 11.4 g/dL — AB (ref 12.0–15.0)
MCH: 30 pg (ref 26.0–34.0)
MCHC: 32.9 g/dL (ref 30.0–36.0)
MCV: 91.1 fL (ref 78.0–100.0)
Platelets: 283 10*3/uL (ref 150–400)
RBC: 3.8 MIL/uL — ABNORMAL LOW (ref 3.87–5.11)
RDW: 12.6 % (ref 11.5–15.5)
WBC: 8.2 10*3/uL (ref 4.0–10.5)

## 2016-06-04 LAB — BASIC METABOLIC PANEL
ANION GAP: 10 (ref 5–15)
BUN: 20 mg/dL (ref 6–20)
CHLORIDE: 103 mmol/L (ref 101–111)
CO2: 27 mmol/L (ref 22–32)
CREATININE: 0.65 mg/dL (ref 0.44–1.00)
Calcium: 8.8 mg/dL — ABNORMAL LOW (ref 8.9–10.3)
GFR calc Af Amer: >60 mL/min (ref >60)
GFR calc non Af Amer: >60 mL/min (ref >60)
Glucose, Bld: 123 mg/dL — ABNORMAL HIGH (ref 65–99)
Potassium: 3.2 mmol/L — ABNORMAL LOW (ref 3.5–5.1)
Sodium: 140 mmol/L (ref 135–145)

## 2016-06-04 LAB — SURGICAL PCR SCREEN
MRSA, PCR: NEGATIVE
STAPHYLOCOCCUS AUREUS: NEGATIVE

## 2016-06-04 NOTE — Telephone Encounter (Signed)
Left vm regarding [facility] from 05/26/16. Contact information provided.

## 2016-06-04 NOTE — Progress Notes (Signed)
Orders for patient's total joint were accidentally d/c'ed when she had here surgery on 06/02/2016! Thank you!

## 2016-06-04 NOTE — Progress Notes (Signed)
Please place orders in EPIC as patient has a Pre-op appointment on 06/04/2016 ! Thank you!

## 2016-06-07 ENCOUNTER — Telehealth: Payer: Self-pay | Admitting: Nurse Practitioner

## 2016-06-07 ENCOUNTER — Telehealth: Payer: Self-pay | Admitting: General Surgery

## 2016-06-07 NOTE — Telephone Encounter (Signed)
Pt had her lumpectomy done last week. She is off estrogen.  The radiation will be postponed until after her knee surgery.  If the lypmh node is positive then will have to put off knee surgery.

## 2016-06-07 NOTE — Telephone Encounter (Signed)
Called home and mobile number.  Left message at mobile number.  Discussed with Dr. Lyla Glassing.  OK to proceed with knee surgery this week.  Will need reexcision of margin after 6 weeks of PT post op.

## 2016-06-09 ENCOUNTER — Telehealth: Payer: Self-pay | Admitting: General Surgery

## 2016-06-09 NOTE — Telephone Encounter (Signed)
Discussed pathology with patient.  Reviewed timeline of events.  I will see her back in a few weeks in clinic.

## 2016-06-10 ENCOUNTER — Inpatient Hospital Stay (HOSPITAL_COMMUNITY): Payer: Medicare Other

## 2016-06-10 ENCOUNTER — Inpatient Hospital Stay (HOSPITAL_COMMUNITY)
Admission: RE | Admit: 2016-06-10 | Discharge: 2016-06-12 | DRG: 470 | Disposition: A | Discharge status: Home / Self Care | Payer: Medicare Other | Source: Ambulatory Visit | Attending: Orthopedic Surgery | Admitting: Orthopedic Surgery

## 2016-06-10 ENCOUNTER — Inpatient Hospital Stay (HOSPITAL_COMMUNITY): Payer: Medicare Other | Admitting: Registered Nurse

## 2016-06-10 ENCOUNTER — Encounter (HOSPITAL_COMMUNITY)
Admission: RE | Disposition: A | Discharge status: Home / Self Care | Payer: Self-pay | Source: Ambulatory Visit | Attending: Orthopedic Surgery

## 2016-06-10 ENCOUNTER — Encounter (HOSPITAL_COMMUNITY): Payer: Self-pay | Admitting: *Deleted

## 2016-06-10 DIAGNOSIS — D0511 Intraductal carcinoma in situ of right breast: Secondary | ICD-10-CM | POA: Diagnosis not present

## 2016-06-10 DIAGNOSIS — Z853 Personal history of malignant neoplasm of breast: Secondary | ICD-10-CM | POA: Diagnosis not present

## 2016-06-10 DIAGNOSIS — X58XXXA Exposure to other specified factors, initial encounter: Secondary | ICD-10-CM | POA: Diagnosis present

## 2016-06-10 DIAGNOSIS — E7800 Pure hypercholesterolemia, unspecified: Secondary | ICD-10-CM | POA: Diagnosis present

## 2016-06-10 DIAGNOSIS — Z880 Allergy status to penicillin: Secondary | ICD-10-CM | POA: Diagnosis not present

## 2016-06-10 DIAGNOSIS — Z888 Allergy status to other drugs, medicaments and biological substances status: Secondary | ICD-10-CM

## 2016-06-10 DIAGNOSIS — F329 Major depressive disorder, single episode, unspecified: Secondary | ICD-10-CM | POA: Diagnosis present

## 2016-06-10 DIAGNOSIS — Z85828 Personal history of other malignant neoplasm of skin: Secondary | ICD-10-CM | POA: Diagnosis not present

## 2016-06-10 DIAGNOSIS — Z881 Allergy status to other antibiotic agents status: Secondary | ICD-10-CM | POA: Diagnosis not present

## 2016-06-10 DIAGNOSIS — G8918 Other acute postprocedural pain: Secondary | ICD-10-CM | POA: Diagnosis not present

## 2016-06-10 DIAGNOSIS — F419 Anxiety disorder, unspecified: Secondary | ICD-10-CM | POA: Diagnosis present

## 2016-06-10 DIAGNOSIS — Z882 Allergy status to sulfonamides status: Secondary | ICD-10-CM | POA: Diagnosis not present

## 2016-06-10 DIAGNOSIS — M1712 Unilateral primary osteoarthritis, left knee: Secondary | ICD-10-CM | POA: Diagnosis not present

## 2016-06-10 DIAGNOSIS — Z96652 Presence of left artificial knee joint: Secondary | ICD-10-CM | POA: Diagnosis not present

## 2016-06-10 DIAGNOSIS — Z471 Aftercare following joint replacement surgery: Secondary | ICD-10-CM | POA: Diagnosis not present

## 2016-06-10 DIAGNOSIS — S82142A Displaced bicondylar fracture of left tibia, initial encounter for closed fracture: Secondary | ICD-10-CM | POA: Diagnosis not present

## 2016-06-10 DIAGNOSIS — Z09 Encounter for follow-up examination after completed treatment for conditions other than malignant neoplasm: Secondary | ICD-10-CM

## 2016-06-10 HISTORY — PX: KNEE ARTHROPLASTY: SHX992

## 2016-06-10 LAB — TYPE AND SCREEN
ABO/RH(D): A POS
ANTIBODY SCREEN: NEGATIVE

## 2016-06-10 LAB — ABO/RH: ABO/RH(D): A POS

## 2016-06-10 SURGERY — ARTHROPLASTY, KNEE, TOTAL, USING IMAGELESS COMPUTER-ASSISTED NAVIGATION
Anesthesia: Spinal | Site: Knee | Laterality: Left

## 2016-06-10 MED ORDER — BUPIVACAINE HCL (PF) 0.25 % IJ SOLN
INTRAMUSCULAR | Status: DC | PRN
  Administered 2016-06-10: 30 mL

## 2016-06-10 MED ORDER — ROPIVACAINE HCL 7.5 MG/ML IJ SOLN
INTRAMUSCULAR | Status: DC | PRN
  Administered 2016-06-10: 20 mL via PERINEURAL

## 2016-06-10 MED ORDER — ACETAMINOPHEN 10 MG/ML IV SOLN
1000.0000 mg | INTRAVENOUS | Status: AC
Start: 2016-06-10 — End: 2016-06-10
  Administered 2016-06-10: 1000 mg via INTRAVENOUS

## 2016-06-10 MED ORDER — CALCIUM CARBONATE-VITAMIN D 500-200 MG-UNIT PO TABS
1.0000 | ORAL_TABLET | Freq: Two times a day (BID) | ORAL | Status: DC
  Administered 2016-06-11 – 2016-06-12 (×3): 1 via ORAL
  Filled 2016-06-10 (×3): qty 1

## 2016-06-10 MED ORDER — FENTANYL CITRATE (PF) 100 MCG/2ML IJ SOLN
INTRAMUSCULAR | Status: AC
  Filled 2016-06-10: qty 2

## 2016-06-10 MED ORDER — TRANEXAMIC ACID 1000 MG/10ML IV SOLN
1000.0000 mg | Freq: Once | INTRAVENOUS | Status: AC
  Administered 2016-06-10: 1000 mg via INTRAVENOUS
  Filled 2016-06-10: qty 10

## 2016-06-10 MED ORDER — MIDAZOLAM HCL 5 MG/5ML IJ SOLN
INTRAMUSCULAR | Status: DC | PRN
  Administered 2016-06-10 (×2): 1 mg via INTRAVENOUS

## 2016-06-10 MED ORDER — SODIUM CHLORIDE 0.9 % IR SOLN
Status: DC | PRN
  Administered 2016-06-10: 2000 mL

## 2016-06-10 MED ORDER — ACETAMINOPHEN 325 MG PO TABS: 650.0000 mg | ORAL_TABLET | Freq: Four times a day (QID) | ORAL | Status: DC | PRN

## 2016-06-10 MED ORDER — PROPOFOL 10 MG/ML IV BOLUS
INTRAVENOUS | Status: AC
  Filled 2016-06-10: qty 20

## 2016-06-10 MED ORDER — ACETAMINOPHEN 650 MG RE SUPP: 650.0000 mg | Freq: Four times a day (QID) | RECTAL | Status: DC | PRN

## 2016-06-10 MED ORDER — VANCOMYCIN HCL IN DEXTROSE 1-5 GM/200ML-% IV SOLN
1000.0000 mg | Freq: Two times a day (BID) | INTRAVENOUS | Status: AC
  Administered 2016-06-11: 1000 mg via INTRAVENOUS
  Filled 2016-06-10: qty 200

## 2016-06-10 MED ORDER — LACTATED RINGERS IV SOLN: INTRAVENOUS | Status: DC | PRN

## 2016-06-10 MED ORDER — SALINE SPRAY 0.65 % NA SOLN: 1.0000 | Freq: Two times a day (BID) | NASAL | Status: DC | PRN

## 2016-06-10 MED ORDER — PHENYLEPHRINE HCL 10 MG/ML IJ SOLN
INTRAMUSCULAR | Status: AC
  Filled 2016-06-10: qty 1

## 2016-06-10 MED ORDER — SODIUM CHLORIDE 0.9 % IJ SOLN
INTRAMUSCULAR | Status: AC
  Filled 2016-06-10: qty 50

## 2016-06-10 MED ORDER — MAGNESIUM OXIDE 400 (241.3 MG) MG PO TABS
200.0000 mg | ORAL_TABLET | Freq: Three times a day (TID) | ORAL | Status: DC
  Administered 2016-06-11 – 2016-06-12 (×4): 200 mg via ORAL
  Filled 2016-06-10 (×4): qty 1

## 2016-06-10 MED ORDER — VANCOMYCIN HCL IN DEXTROSE 1-5 GM/200ML-% IV SOLN
1000.0000 mg | INTRAVENOUS | Status: AC
  Administered 2016-06-10: 1000 mg via INTRAVENOUS
  Filled 2016-06-10: qty 200

## 2016-06-10 MED ORDER — SERTRALINE HCL 100 MG PO TABS
100.0000 mg | ORAL_TABLET | Freq: Every day | ORAL | Status: DC
  Administered 2016-06-10 – 2016-06-11 (×2): 100 mg via ORAL
  Filled 2016-06-10 (×2): qty 1

## 2016-06-10 MED ORDER — KETOROLAC TROMETHAMINE 30 MG/ML IJ SOLN
INTRAMUSCULAR | Status: AC
  Filled 2016-06-10: qty 1

## 2016-06-10 MED ORDER — PRAVASTATIN SODIUM 20 MG PO TABS
40.0000 mg | ORAL_TABLET | Freq: Every day | ORAL | Status: DC
  Administered 2016-06-10 – 2016-06-11 (×2): 40 mg via ORAL
  Filled 2016-06-10 (×2): qty 2

## 2016-06-10 MED ORDER — DIPHENHYDRAMINE HCL 12.5 MG/5ML PO ELIX: 12.5000 mg | ORAL_SOLUTION | ORAL | Status: DC | PRN

## 2016-06-10 MED ORDER — OXYCODONE HCL 5 MG PO TABS
5.0000 mg | ORAL_TABLET | ORAL | Status: DC | PRN
  Administered 2016-06-11 (×2): 10 mg via ORAL
  Administered 2016-06-11 – 2016-06-12 (×7): 5 mg via ORAL
  Administered 2016-06-12: 10 mg via ORAL
  Administered 2016-06-12 (×3): 5 mg via ORAL
  Filled 2016-06-10: qty 2
  Filled 2016-06-10: qty 1
  Filled 2016-06-10: qty 2
  Filled 2016-06-10: qty 1
  Filled 2016-06-10: qty 2
  Filled 2016-06-10 (×6): qty 1
  Filled 2016-06-10: qty 2
  Filled 2016-06-10: qty 1

## 2016-06-10 MED ORDER — METOCLOPRAMIDE HCL 5 MG PO TABS: 5.0000 mg | ORAL_TABLET | Freq: Three times a day (TID) | ORAL | Status: DC | PRN

## 2016-06-10 MED ORDER — HYDROMORPHONE HCL 1 MG/ML IJ SOLN: 0.5000 mg | INTRAMUSCULAR | Status: DC | PRN

## 2016-06-10 MED ORDER — MAGNESIUM 250 MG PO TABS: 250.0000 mg | ORAL_TABLET | Freq: Three times a day (TID) | ORAL | Status: DC

## 2016-06-10 MED ORDER — DEXAMETHASONE SODIUM PHOSPHATE 10 MG/ML IJ SOLN
INTRAMUSCULAR | Status: AC
  Filled 2016-06-10: qty 1

## 2016-06-10 MED ORDER — PROPOFOL 10 MG/ML IV BOLUS
INTRAVENOUS | Status: AC
  Filled 2016-06-10: qty 60

## 2016-06-10 MED ORDER — METOCLOPRAMIDE HCL 5 MG/ML IJ SOLN
5.0000 mg | Freq: Three times a day (TID) | INTRAMUSCULAR | Status: DC | PRN
Start: 2016-06-10 — End: 2016-06-12

## 2016-06-10 MED ORDER — MIDAZOLAM HCL 2 MG/2ML IJ SOLN
2.0000 mg | Freq: Once | INTRAMUSCULAR | Status: AC
  Administered 2016-06-10: 2 mg via INTRAVENOUS

## 2016-06-10 MED ORDER — ONDANSETRON HCL 4 MG PO TABS
4.0000 mg | ORAL_TABLET | Freq: Four times a day (QID) | ORAL | Status: DC | PRN
  Administered 2016-06-11 – 2016-06-12 (×2): 4 mg via ORAL
  Filled 2016-06-10 (×2): qty 1

## 2016-06-10 MED ORDER — PROMETHAZINE HCL 25 MG/ML IJ SOLN: 6.2500 mg | INTRAMUSCULAR | Status: DC | PRN

## 2016-06-10 MED ORDER — APIXABAN 2.5 MG PO TABS
2.5000 mg | ORAL_TABLET | Freq: Two times a day (BID) | ORAL | Status: DC
  Administered 2016-06-11 – 2016-06-12 (×3): 2.5 mg via ORAL
  Filled 2016-06-10 (×3): qty 1

## 2016-06-10 MED ORDER — FLUTICASONE PROPIONATE 50 MCG/ACT NA SUSP: 1.0000 | Freq: Two times a day (BID) | NASAL | Status: DC | PRN

## 2016-06-10 MED ORDER — MIDAZOLAM HCL 2 MG/2ML IJ SOLN
INTRAMUSCULAR | Status: AC
  Filled 2016-06-10: qty 2

## 2016-06-10 MED ORDER — SODIUM CHLORIDE 0.9 % IR SOLN
Status: DC | PRN
  Administered 2016-06-10: 1000 mL

## 2016-06-10 MED ORDER — ROPIVACAINE HCL 7.5 MG/ML IJ SOLN
INTRAMUSCULAR | Status: AC
  Filled 2016-06-10: qty 20

## 2016-06-10 MED ORDER — ONDANSETRON HCL 4 MG/2ML IJ SOLN
4.0000 mg | Freq: Four times a day (QID) | INTRAMUSCULAR | Status: DC | PRN
  Administered 2016-06-11: 4 mg via INTRAVENOUS
  Filled 2016-06-10: qty 2

## 2016-06-10 MED ORDER — KETOROLAC TROMETHAMINE 30 MG/ML IJ SOLN
INTRAMUSCULAR | Status: DC | PRN
  Administered 2016-06-10: 30 mg

## 2016-06-10 MED ORDER — ONDANSETRON HCL 4 MG/2ML IJ SOLN
INTRAMUSCULAR | Status: AC
  Filled 2016-06-10: qty 2

## 2016-06-10 MED ORDER — ALUM & MAG HYDROXIDE-SIMETH 200-200-20 MG/5ML PO SUSP: 30.0000 mL | ORAL | Status: DC | PRN

## 2016-06-10 MED ORDER — DOCUSATE SODIUM 100 MG PO CAPS
100.0000 mg | ORAL_CAPSULE | Freq: Two times a day (BID) | ORAL | Status: DC
  Administered 2016-06-10 – 2016-06-12 (×4): 100 mg via ORAL
  Filled 2016-06-10 (×4): qty 1

## 2016-06-10 MED ORDER — HYDROMORPHONE HCL 1 MG/ML IJ SOLN: 0.2500 mg | INTRAMUSCULAR | Status: DC | PRN

## 2016-06-10 MED ORDER — FENTANYL CITRATE (PF) 100 MCG/2ML IJ SOLN
100.0000 ug | Freq: Once | INTRAMUSCULAR | Status: AC
  Administered 2016-06-10: 100 ug via INTRAVENOUS

## 2016-06-10 MED ORDER — SODIUM CHLORIDE 0.9 % IV SOLN
INTRAVENOUS | Status: DC
  Administered 2016-06-10: 14:00:00 via INTRAVENOUS

## 2016-06-10 MED ORDER — KETOROLAC TROMETHAMINE 15 MG/ML IJ SOLN
7.5000 mg | Freq: Four times a day (QID) | INTRAMUSCULAR | Status: AC
  Administered 2016-06-11 (×2): 7.5 mg via INTRAVENOUS
  Filled 2016-06-10 (×2): qty 1

## 2016-06-10 MED ORDER — SODIUM CHLORIDE 0.9 % IJ SOLN
INTRAMUSCULAR | Status: DC | PRN
  Administered 2016-06-10: 30 mL

## 2016-06-10 MED ORDER — MIDAZOLAM HCL 2 MG/2ML IJ SOLN: 0.5000 mg | Freq: Once | INTRAMUSCULAR | Status: DC | PRN

## 2016-06-10 MED ORDER — BUPIVACAINE HCL (PF) 0.5 % IJ SOLN
INTRAMUSCULAR | Status: DC | PRN
  Administered 2016-06-10: 3 mL

## 2016-06-10 MED ORDER — CHLORHEXIDINE GLUCONATE 4 % EX LIQD: 60.0000 mL | Freq: Once | CUTANEOUS | Status: DC

## 2016-06-10 MED ORDER — SERTRALINE HCL 100 MG PO TABS: 100.0000 mg | ORAL_TABLET | Freq: Every day | ORAL | Status: DC

## 2016-06-10 MED ORDER — SODIUM CHLORIDE 0.9 % IV SOLN
INTRAVENOUS | Status: DC
  Administered 2016-06-10: 22:00:00 via INTRAVENOUS

## 2016-06-10 MED ORDER — MENTHOL 3 MG MT LOZG: 1.0000 | LOZENGE | OROMUCOSAL | Status: DC | PRN

## 2016-06-10 MED ORDER — ONDANSETRON HCL 4 MG/2ML IJ SOLN
INTRAMUSCULAR | Status: DC | PRN
  Administered 2016-06-10: 4 mg via INTRAVENOUS

## 2016-06-10 MED ORDER — DEXAMETHASONE SODIUM PHOSPHATE 10 MG/ML IJ SOLN
INTRAMUSCULAR | Status: DC | PRN
  Administered 2016-06-10: 10 mg via INTRAVENOUS

## 2016-06-10 MED ORDER — DEXAMETHASONE SODIUM PHOSPHATE 10 MG/ML IJ SOLN
10.0000 mg | Freq: Once | INTRAMUSCULAR | Status: AC
  Administered 2016-06-11: 10 mg via INTRAVENOUS
  Filled 2016-06-10: qty 1

## 2016-06-10 MED ORDER — LACTATED RINGERS IV SOLN
INTRAVENOUS | Status: DC | PRN
  Administered 2016-06-10 (×2): via INTRAVENOUS

## 2016-06-10 MED ORDER — SENNA 8.6 MG PO TABS
2.0000 | ORAL_TABLET | Freq: Every day | ORAL | Status: DC
  Administered 2016-06-10 – 2016-06-11 (×2): 17.2 mg via ORAL
  Filled 2016-06-10 (×2): qty 2

## 2016-06-10 MED ORDER — PHENOL 1.4 % MT LIQD: 1.0000 | OROMUCOSAL | Status: DC | PRN

## 2016-06-10 MED ORDER — METHOCARBAMOL 1000 MG/10ML IJ SOLN
500.0000 mg | Freq: Four times a day (QID) | INTRAVENOUS | Status: DC | PRN
  Filled 2016-06-10: qty 5

## 2016-06-10 MED ORDER — METHOCARBAMOL 500 MG PO TABS
500.0000 mg | ORAL_TABLET | Freq: Four times a day (QID) | ORAL | Status: DC | PRN
  Administered 2016-06-11 – 2016-06-12 (×3): 500 mg via ORAL
  Filled 2016-06-10 (×3): qty 1

## 2016-06-10 MED ORDER — POLYETHYLENE GLYCOL 3350 17 G PO PACK: 17.0000 g | PACK | Freq: Every day | ORAL | Status: DC | PRN

## 2016-06-10 MED ORDER — PROPOFOL 500 MG/50ML IV EMUL
INTRAVENOUS | Status: DC | PRN
  Administered 2016-06-10: 75 ug/kg/min via INTRAVENOUS

## 2016-06-10 MED ORDER — FENTANYL CITRATE (PF) 100 MCG/2ML IJ SOLN
INTRAMUSCULAR | Status: DC | PRN
  Administered 2016-06-10 (×2): 50 ug via INTRAVENOUS

## 2016-06-10 MED ORDER — CALCIUM CARB-CHOLECALCIFEROL 600-200 MG-UNIT PO TABS: 1.0000 | ORAL_TABLET | Freq: Two times a day (BID) | ORAL | Status: DC

## 2016-06-10 MED ORDER — MEPERIDINE HCL 50 MG/ML IJ SOLN: 6.2500 mg | INTRAMUSCULAR | Status: DC | PRN

## 2016-06-10 MED ORDER — TRANEXAMIC ACID 1000 MG/10ML IV SOLN
1000.0000 mg | INTRAVENOUS | Status: AC
  Administered 2016-06-10: 1000 mg via INTRAVENOUS
  Filled 2016-06-10: qty 1100

## 2016-06-10 MED ORDER — BUPIVACAINE HCL (PF) 0.25 % IJ SOLN
INTRAMUSCULAR | Status: AC
  Filled 2016-06-10: qty 30

## 2016-06-10 MED ORDER — LORATADINE 10 MG PO TABS
10.0000 mg | ORAL_TABLET | Freq: Every day | ORAL | Status: DC
  Administered 2016-06-10 – 2016-06-11 (×2): 10 mg via ORAL
  Filled 2016-06-10 (×2): qty 1

## 2016-06-10 MED ORDER — POVIDONE-IODINE 10 % EX SWAB: 2.0000 "application " | Freq: Once | CUTANEOUS | Status: DC

## 2016-06-10 SURGICAL SUPPLY — 67 items
ADH SKN CLS APL DERMABOND .7 (GAUZE/BANDAGES/DRESSINGS) ×2
BAG SPEC THK2 15X12 ZIP CLS (MISCELLANEOUS)
BAG ZIPLOCK 12X15 (MISCELLANEOUS) IMPLANT
BANDAGE ACE 4X5 VEL STRL LF (GAUZE/BANDAGES/DRESSINGS) ×2 IMPLANT
BANDAGE ACE 6X5 VEL STRL LF (GAUZE/BANDAGES/DRESSINGS) ×2 IMPLANT
BLADE SAW RECIPROCATING 77.5 (BLADE) ×2 IMPLANT
BONE CEMENT SIMPLEX TOBRAMYCIN (Cement) ×6 IMPLANT
CAPT KNEE TOTAL 3 ×1 IMPLANT
CEMENT BONE SIMPLEX TOBRAMYCIN (Cement) IMPLANT
CHLORAPREP W/TINT 26ML (MISCELLANEOUS) ×4 IMPLANT
CUFF TOURN SGL QUICK 34 (TOURNIQUET CUFF) ×2
CUFF TRNQT CYL 34X4X40X1 (TOURNIQUET CUFF) ×1 IMPLANT
DECANTER SPIKE VIAL GLASS SM (MISCELLANEOUS) ×3 IMPLANT
DERMABOND ADVANCED (GAUZE/BANDAGES/DRESSINGS) ×2
DERMABOND ADVANCED .7 DNX12 (GAUZE/BANDAGES/DRESSINGS) ×2 IMPLANT
DRAPE SHEET LG 3/4 BI-LAMINATE (DRAPES) ×4 IMPLANT
DRAPE U-SHAPE 47X51 STRL (DRAPES) ×2 IMPLANT
DRSG AQUACEL AG ADV 3.5X10 (GAUZE/BANDAGES/DRESSINGS) ×2 IMPLANT
DRSG TEGADERM 4X4.75 (GAUZE/BANDAGES/DRESSINGS) ×1 IMPLANT
ELECT BLADE TIP CTD 4 INCH (ELECTRODE) ×2 IMPLANT
ELECT REM PT RETURN 9FT ADLT (ELECTROSURGICAL) ×2
ELECTRODE REM PT RTRN 9FT ADLT (ELECTROSURGICAL) ×1 IMPLANT
EVACUATOR 1/8 PVC DRAIN (DRAIN) ×1 IMPLANT
GAUZE SPONGE 4X4 12PLY STRL (GAUZE/BANDAGES/DRESSINGS) ×2 IMPLANT
GLOVE BIO SURGEON STRL SZ8.5 (GLOVE) ×4 IMPLANT
GLOVE BIOGEL PI IND STRL 7.0 (GLOVE) IMPLANT
GLOVE BIOGEL PI IND STRL 7.5 (GLOVE) IMPLANT
GLOVE BIOGEL PI IND STRL 8.5 (GLOVE) ×1 IMPLANT
GLOVE BIOGEL PI INDICATOR 7.0 (GLOVE) ×1
GLOVE BIOGEL PI INDICATOR 7.5 (GLOVE) ×1
GLOVE BIOGEL PI INDICATOR 8.5 (GLOVE) ×1
GLOVE SURG SS PI 7.0 STRL IVOR (GLOVE) ×1 IMPLANT
GLOVE SURG SS PI 7.5 STRL IVOR (GLOVE) ×1 IMPLANT
GOWN SPEC L3 XXLG W/TWL (GOWN DISPOSABLE) ×3 IMPLANT
GOWN STRL REUS W/TWL LRG LVL3 (GOWN DISPOSABLE) ×1 IMPLANT
HANDPIECE INTERPULSE COAX TIP (DISPOSABLE) ×2
HOOD PEEL AWAY FLYTE STAYCOOL (MISCELLANEOUS) ×4 IMPLANT
LIQUID BAND (GAUZE/BANDAGES/DRESSINGS) ×1 IMPLANT
MARKER SKIN DUAL TIP RULER LAB (MISCELLANEOUS) ×3 IMPLANT
NDL SPNL 18GX3.5 QUINCKE PK (NEEDLE) ×1 IMPLANT
NEEDLE SPNL 18GX3.5 QUINCKE PK (NEEDLE) ×2 IMPLANT
PACK TOTAL KNEE CUSTOM (KITS) ×2 IMPLANT
PADDING CAST COTTON 6X4 STRL (CAST SUPPLIES) ×2 IMPLANT
POSITIONER SURGICAL ARM (MISCELLANEOUS) ×2 IMPLANT
SAW OSC TIP CART 19.5X105X1.3 (SAW) ×2 IMPLANT
SEALER BIPOLAR AQUA 6.0 (INSTRUMENTS) ×2 IMPLANT
SET HNDPC FAN SPRY TIP SCT (DISPOSABLE) ×1 IMPLANT
SET PAD KNEE POSITIONER (MISCELLANEOUS) ×2 IMPLANT
SPONGE DRAIN TRACH 4X4 STRL 2S (GAUZE/BANDAGES/DRESSINGS) ×1 IMPLANT
SPONGE LAP 18X18 X RAY DECT (DISPOSABLE) ×1 IMPLANT
STEM CEMENTED TRIATHLON (Stem) ×1 IMPLANT
SUCTION FRAZIER HANDLE 12FR (TUBING) ×1
SUCTION TUBE FRAZIER 12FR DISP (TUBING) ×1 IMPLANT
SUT MNCRL AB 3-0 PS2 18 (SUTURE) ×2 IMPLANT
SUT MON AB 2-0 CT1 36 (SUTURE) ×4 IMPLANT
SUT STRATAFIX PDO 1 14 VIOLET (SUTURE) ×2
SUT STRATFX PDO 1 14 VIOLET (SUTURE) ×1
SUT VIC AB 1 CT1 36 (SUTURE) ×6 IMPLANT
SUT VIC AB 2-0 CT1 27 (SUTURE) ×2
SUT VIC AB 2-0 CT1 TAPERPNT 27 (SUTURE) ×1 IMPLANT
SUTURE STRATFX PDO 1 14 VIOLET (SUTURE) ×1 IMPLANT
SYR 50ML LL SCALE MARK (SYRINGE) ×2 IMPLANT
TOWER CARTRIDGE SMART MIX (DISPOSABLE) ×1 IMPLANT
TRAY FOLEY W/METER SILVER 14FR (SET/KITS/TRAYS/PACK) ×1 IMPLANT
WATER STERILE IRR 1000ML POUR (IV SOLUTION) ×2 IMPLANT
WRAP KNEE MAXI GEL POST OP (GAUZE/BANDAGES/DRESSINGS) ×2 IMPLANT
YANKAUER SUCT BULB TIP 10FT TU (MISCELLANEOUS) ×2 IMPLANT

## 2016-06-10 NOTE — Op Note (Signed)
OPERATIVE REPORT  SURGEON: Rod Can, MD   ASSISTANT: Staff  PREOPERATIVE DIAGNOSIS: Left knee arthritis with insufficiency fracture of the medial tibial plateau.   POSTOPERATIVE DIAGNOSIS: Left knee arthritis with insufficiency fracture of the medial tibial plateau.   PROCEDURE: Left total knee arthroplasty.   IMPLANTS: Stryker Triathlon CR femur, size 4. Stryker universal tibia, size 3 with a 12 x 50 mm cemented stem. X3 polyethelyene insert, size 11 mm, CR. 3 button asymmetric patella, size 32 mm. Simplex P bone cement.  ANESTHESIA:  Spinal  TOURNIQUET TIME: 37 min at 375mm Hg.  ESTIMATED BLOOD LOSS: 450 mL.  ANTIBIOTICS: 1 g Ancef.  DRAINS: Medium HV x1.  SPECIMEN: Proximal medial tibia for pathology.  COMPLICATIONS: None   CONDITION: PACU - hemodynamically stable.   BRIEF CLINICAL NOTE: Dawn West is a 66 y.o. female with a long-standing history of Left knee arthritis with insufficiency fracture to the proximal medial tibia. After failing conservative management, the patient was indicated for total knee arthroplasty. The risks, benefits, and alternatives to the procedure were explained, and the patient elected to proceed.  PROCEDURE IN DETAIL: The surgical site was marked in the pre-op holding area. Once inside the operative room, spinal anesthesia was obtained, and a foley catheter was inserted. The patient was then positioned, a nonsterile tourniquet was placed, and the lower extremity was prepped and draped in the normal sterile surgical fashion. A time-out was called verifying side and site of surgery. The patient received IV antibiotics within 60 minutes of beginning the procedure.   An anterior approach to the knee was performed utilizing a mid vastus arthrotomy. A medial release was performed and the patellar fat pad was excised. Stryker navigation was used  to cut the distal femur perpendicular to the mechanical axis. A freehand patellar resection was performed, and the patella was sized an prepared with 3 lug holes.  She had a sagittal plane fracture to the medial aspect of the medial tibial plateau. I remove the unstable fragment with a Rongeur. Nagivation was used to make a neutral proximal tibia resection, taking 8 mm of bone from the less affected lateral side with 3 degrees of slope. The medial tibial plateau was passed off the table as a specimen and sent to pathology. The menisci were excised. A spacer block was placed, and the alignment and balance in extension were confirmed.   The distal femur was sized using the 3-degree external rotation guide referencing the posterior femoral cortex. The appropriate 4-in-1 cutting block was pinned into place. Rotation was checked using Whiteside's line, the epicondylar axis, and then confirmed with a spacer block in flexion. The remaining femoral cuts were performed, taking care to protect the MCL.  The tibia was sized and the trial tray was pinned into place. The remaining trail components were inserted. The knee was stable to varus and valgus stress through a full range of motion. The patella tracked centrally, and the PCL was well balanced. The trial components were removed, and the proximal tibial surface was prepared. The extremity was exsanguinated with an Esmarch, and the tourniquet was inflated. Small drill holes were made in the sclerotic subchondral bone.The cut bony surfaces were irrigated with pulse lavage. Final components were cemented into place and excess cement was cleared. The trial insert was placed, and the knee was brought into extension while the cement polymerized. Once the cement was hard, the knee was tested for a final time and found to be well balanced. The trial insert was  exchanged for the real polyethylene insert.  The wound was copiously irrigated with a dilute betadine solution  followed by normal saline with pulse lavage. Marcaine solution was injected into the periarticular soft tissue. The wound was closed in layers using #1 Vicryl and Strata fix for the fascia, 2-0 Vicryl for the subcutaneous fat, 2-0 Monocryl for the deep dermal layer, 3-0 running Monocryl subcuticular Stitch, and Dermabond for the skin. Once the glue was fully dried, an Aquacell Ag and compressive dressing were applied. The tourniquet was let down, and the patient was transported to the recovery room in stable ondition. Sponge, needle, and instrument counts were correct at the end of the case x2. The patient tolerated the procedure well and there were no known complications.

## 2016-06-10 NOTE — Progress Notes (Signed)
AssistedDr. Jackson with left, ultrasound guided, adductor canal block. Side rails up, monitors on throughout procedure. See vital signs in flow sheet. Tolerated Procedure well.  

## 2016-06-10 NOTE — Anesthesia Procedure Notes (Signed)
Spinal  Patient location during procedure: OR End time: 06/10/2016 4:04 PM Staffing Resident/CRNA: Enrigue Catena E Performed: anesthesiologist  Preanesthetic Checklist Completed: patient identified, site marked, surgical consent, pre-op evaluation, timeout performed, IV checked, risks and benefits discussed and monitors and equipment checked Spinal Block Patient position: sitting Prep: Betadine and DuraPrep Patient monitoring: heart rate, continuous pulse ox and blood pressure Approach: midline Location: L4-5 Injection technique: single-shot Needle Needle type: Pencan  Needle gauge: 24 G Needle length: 9 cm Additional Notes Expiration date of kit checked and confirmed. Patient tolerated procedure well, without complications.Good CSF x3. Negative heme or paresthesia

## 2016-06-10 NOTE — H&P (View-Only) (Signed)
TOTAL KNEE ADMISSION H&P  Patient is being admitted for left total knee arthroplasty.  Subjective:  Chief Complaint:left knee pain.  HPI: Dawn West, 66 y.o. female, has a history of pain and functional disability in the left knee due to arthritis and has failed non-surgical conservative treatments for greater than 12 weeks to includeNSAID's and/or analgesics, corticosteriod injections, viscosupplementation injections, flexibility and strengthening excercises, supervised PT with diminished ADL's post treatment, use of assistive devices, weight reduction as appropriate and activity modification.  Onset of symptoms was abrupt, starting 1 years ago with rapidlly worsening course since that time. The patient noted no past surgery on the left knee(s).  Patient currently rates pain in the left knee(s) at 10 out of 10 with activity. Patient has night pain, worsening of pain with activity and weight bearing, pain that interferes with activities of daily living, pain with passive range of motion, crepitus and joint swelling.  Patient has evidence of subchondral cysts, subchondral sclerosis, periarticular osteophytes, joint subluxation and joint space narrowing by imaging studies. There is no active infection.  Patient Active Problem List   Diagnosis Date Noted  . Ductal carcinoma in situ (DCIS) of right breast 05/25/2016   Past Medical History:  Diagnosis Date  . Anxiety   . Arthritis    knees  . Cancer ([place]) 12/01/11   basal cell skin ca Left upper arm  . Complication of anesthesia   . Depression   . Hypercholesteremia   . PONV (postoperative nausea and vomiting)     Past Surgical History:  Procedure Laterality Date  . ABDOMINAL HYSTERECTOMY    . BREAST LUMPECTOMY WITH RADIOACTIVE SEED AND SENTINEL LYMPH NODE BIOPSY Right 06/02/2016   Procedure: RIGHT BREAST LUMPECTOMY WITH RADIOACTIVE SEED AND SENTINEL LYMPH NODE BIOPSY;  Surgeon: [name], MD;  Location: [facility];   Service: General;  Laterality: Right;  . MOUTH SURGERY    . SKIN CANCER EXCISION Left 2013   upper arm, basal cell  . TONSILLECTOMY AND ADENOIDECTOMY    . TOTAL VAGINAL HYSTERECTOMY  12/1996   menorrhagia, fibroids     (Not in a hospital admission) Allergies  Allergen Reactions  . Allegra [Fexofenadine Hcl]     Abdominal pain  . Ciprofloxacin   . Estradiol Other (See Comments)    Pt is too no longer receive any hormonal therapy due to breast cancer  . Penicillins Rash  . Sulfa Antibiotics Rash    Social History  Substance Use Topics  . Smoking status: Never Smoker  . Smokeless tobacco: Never Used  . Alcohol use 0.0 oz/week     Comment: 3 times yearly    Family History  Problem Relation Age of Onset  . Hypertension Mother   . Heart failure Mother   . Diabetes Mother   . Stroke Mother   . COPD Mother   . Hyperlipidemia Mother   . Hypertension Father   . Heart failure Father   . Hypertension Brother   . Hypertension Sister   . Hyperlipidemia Sister   . Hypertension Brother   . Hypertension Brother   . Diabetes Brother      Review of Systems  Constitutional: Positive for malaise/fatigue. Negative for chills, diaphoresis, fever and weight loss.  HENT: Positive for tinnitus. Negative for congestion, ear discharge, ear pain, hearing loss, nosebleeds, sinus pain and sore throat.   Eyes: Negative.   Respiratory: Negative.  Negative for stridor.   Cardiovascular: Negative.   Gastrointestinal: Negative.   Genitourinary: Negative.  Musculoskeletal: Positive for joint pain.  Skin: Negative.   Neurological: Negative.  Negative for weakness.  Endo/Heme/Allergies: Positive for environmental allergies.  Psychiatric/Behavioral: Positive for memory loss.    Objective:  Physical Exam  Vitals reviewed. Constitutional: She is oriented to person, place, and time. She appears well-developed and well-nourished.  HENT:  Head: Normocephalic and atraumatic.  Eyes: Conjunctivae  and EOM are normal. Pupils are equal, round, and reactive to light.  Neck: Normal range of motion. Neck supple.  Cardiovascular: Normal rate, regular rhythm and intact distal pulses.   Respiratory: Effort normal. No respiratory distress.  GI: Soft. She exhibits no distension.  Genitourinary:  Genitourinary Comments: deferred  Musculoskeletal:       Left knee: She exhibits decreased range of motion, swelling, deformity and abnormal alignment. Tenderness found. Medial joint line tenderness noted.  Neurological: She is alert and oriented to person, place, and time. She has normal reflexes.  Skin: Skin is warm and dry.  Psychiatric: She has a normal mood and affect. Her behavior is normal. Judgment and thought content normal.    Vital signs in last 24 hours: @VSRANGES @  Labs:   Estimated body mass index is 26.93 kg/m as calculated from the following:   Height as of 06/02/16: 5\' 3"  (1.6 m).   Weight as of 06/02/16: 68.9 kg (152 lb).   Imaging Review Plain radiographs demonstrate severe degenerative joint disease of the left knee(s). The overall alignment issignificant varus. The bone quality appears to be adequate for age and reported activity level.  Assessment/Plan:  End stage arthritis, left knee   The patient history, physical examination, clinical judgment of the provider and imaging studies are consistent with end stage degenerative joint disease of the left knee(s) and total knee arthroplasty is deemed medically necessary. The treatment options including medical management, injection therapy arthroscopy and arthroplasty were discussed at length. The risks and benefits of total knee arthroplasty were presented and reviewed. The risks due to aseptic loosening, infection, stiffness, patella tracking problems, thromboembolic complications and other imponderables were discussed. The patient acknowledged the explanation, agreed to proceed with the plan and consent was signed. Patient is  being admitted for inpatient treatment for surgery, pain control, PT, OT, prophylactic antibiotics, VTE prophylaxis, progressive ambulation and ADL's and discharge planning. The patient is planning to be discharged home with outpatient PT. Had lumpectomy on 1/24 - will proceed with surgery if lymph node bx (-)

## 2016-06-10 NOTE — Anesthesia Procedure Notes (Signed)
Anesthesia Regional Block:  Adductor canal block  Pre-Anesthetic Checklist: ,, timeout performed, Correct Patient, Correct Site, Correct Laterality, Correct Procedure, Correct Position, site marked, Risks and benefits discussed,  Surgical consent,  Pre-op evaluation,  At surgeon's request and post-op pain management  Laterality: Left and Lower  Prep: chloraprep       Needles:  Injection technique: Single-shot  Needle Type: Echogenic Needle     Needle Length: 9cm 9 cm Needle Gauge: 21 and 21 G    Additional Needles:  Procedures: ultrasound guided (picture in chart) Adductor canal block Narrative:  Start time: 06/10/2016 3:27 PM End time: 06/10/2016 3:34 PM Injection made incrementally with aspirations every 5 mL.  Performed by: Personally  Anesthesiologist: Glennon Mac, Dannae Kato  Additional Notes: Pt identified in Holding room.  Monitors applied. Working IV access confirmed. Sterile prep L thigh.  #21ga ECHOgenic needle into adductor canal with US guidance.  20cc 0.5% Ropivacaine injected incrementally after negative test dose.  Patient asymptomatic, VSS, no heme aspirated, tolerated well.

## 2016-06-10 NOTE — Anesthesia Postprocedure Evaluation (Signed)
Anesthesia Post Note  Patient: Dawn West  Procedure(s) Performed: Procedure(s) (LRB): LEFT TOTAL KNEE ARTHROPLASTY WITH COMPUTER NAVIGATION (Left)  Patient location during evaluation: PACU Anesthesia Type: Spinal and MAC Level of consciousness: awake and alert, oriented and patient cooperative Pain management: pain level controlled Vital Signs Assessment: post-procedure vital signs reviewed and stable Respiratory status: spontaneous breathing, nonlabored ventilation and respiratory function stable Cardiovascular status: blood pressure returned to baseline and stable Postop Assessment: spinal receding and no signs of nausea or vomiting Anesthetic complications: no       Last Vitals:  Vitals:   06/10/16 1853 06/10/16 1900  BP: (!) 108/56 (!) 102/48  Pulse: 80 73  Resp: 15 11  Temp: 36.8 C     Last Pain:  Vitals:   06/10/16 1405  TempSrc:   PainSc: 8     LLE Motor Response: No movement due to regional block (06/10/16 1900) LLE Sensation: No sensation (absent) (06/10/16 1900)          Shaden Higley,E. Aryianna Earwood

## 2016-06-10 NOTE — Anesthesia Preprocedure Evaluation (Addendum)
Anesthesia Evaluation  Patient identified by MRN, date of birth, ID band Patient awake    Reviewed: Allergy & Precautions, NPO status , Patient's Chart, lab work & pertinent test results  History of Anesthesia Complications (+) PONV  Airway Mallampati: II  TM Distance: >3 FB Neck ROM: Full    Dental  (+) Dental Advisory Given   Pulmonary neg pulmonary ROS,    breath sounds clear to auscultation       Cardiovascular negative cardio ROS   Rhythm:Regular Rate:Normal     Neuro/Psych Anxiety Depression negative neurological ROS     GI/Hepatic negative GI ROS, Neg liver ROS,   Endo/Other  negative endocrine ROS  Renal/GU negative Renal ROS     Musculoskeletal  (+) Arthritis , Osteoarthritis,    Abdominal   Peds  Hematology  (+) Blood dyscrasia (Hb 11.2), anemia ,   Anesthesia Other Findings Breast cancer  Reproductive/Obstetrics                            Anesthesia Physical Anesthesia Plan  ASA: II  Anesthesia Plan: Spinal   Post-op Pain Management:  Regional for Post-op pain   Induction:   Airway Management Planned: Natural Airway and Simple Face Mask  Additional Equipment:   Intra-op Plan:   Post-operative Plan:   Informed Consent: I have reviewed the patients History and Physical, chart, labs and discussed the procedure including the risks, benefits and alternatives for the proposed anesthesia with the patient or authorized representative who has indicated his/her understanding and acceptance.   Dental advisory given  Plan Discussed with: CRNA and Surgeon  Anesthesia Plan Comments: (Plan routine monitors, SAB with adductor canal block for post op analgesia)        Anesthesia Quick Evaluation

## 2016-06-10 NOTE — Interval H&P Note (Signed)
History and Physical Interval Note:  06/10/2016 3:09 PM  Dawn West  has presented today for surgery, with the diagnosis of Degenerative joint disease Left knee  The various methods of treatment have been discussed with the patient and family. After consideration of risks, benefits and other options for treatment, the patient has consented to  Procedure(s): LEFT TOTAL KNEE ARTHROPLASTY WITH COMPUTER NAVIGATION (Left) as a surgical intervention .  The patient's history has been reviewed, patient examined, no change in status, stable for surgery.  I have reviewed the patient's chart and labs.  Questions were answered to the patient's satisfaction.    The risks, benefits, and alternatives were discussed with the patient. There are risks associated with the surgery including, but not limited to, problems with anesthesia (death), infection, instability (giving out of the joint), dislocation, differences in leg length/angulation/rotation, fracture of bones, loosening or failure of implants, hematoma (blood accumulation) which may require surgical drainage, blood clots, pulmonary embolism, nerve injury (foot drop and lateral thigh numbness), and blood vessel injury. The patient understands these risks and elects to proceed.    Gatlin Kittell, Horald Pollen

## 2016-06-10 NOTE — Transfer of Care (Signed)
Immediate Anesthesia Transfer of Care Note  Patient: Dawn West  Procedure(s) Performed: Procedure(s): LEFT TOTAL KNEE ARTHROPLASTY WITH COMPUTER NAVIGATION (Left)  Patient Location: PACU  Anesthesia Type:Spinal  Level of Consciousness: awake, alert , oriented and patient cooperative  Airway & Oxygen Therapy: Patient Spontanous Breathing and Patient connected to face mask oxygen  Post-op Assessment: Report given to RN and Post -op Vital signs reviewed and stable  Post vital signs: stable  Last Vitals:  Vitals:   06/10/16 1535 06/10/16 1536  BP: (!) 134/59   Pulse: 69 69  Resp: (!) 9 (!) 9  Temp:      Last Pain:  Vitals:   06/10/16 1405  TempSrc:   PainSc: 8       Patients Stated Pain Goal: 5 ([id number])  Complications: No apparent anesthesia complications  Spinal level  L2

## 2016-06-11 ENCOUNTER — Encounter (HOSPITAL_COMMUNITY): Payer: Self-pay | Admitting: Orthopedic Surgery

## 2016-06-11 LAB — CBC
HCT: 28.4 % — ABNORMAL LOW (ref 36.0–46.0)
Hemoglobin: 9.9 g/dL — ABNORMAL LOW (ref 12.0–15.0)
MCH: 30.7 pg (ref 26.0–34.0)
MCHC: 34.9 g/dL (ref 30.0–36.0)
MCV: 88.2 fL (ref 78.0–100.0)
PLATELETS: 259 10*3/uL (ref 150–400)
RBC: 3.22 MIL/uL — AB (ref 3.87–5.11)
RDW: 12.2 % (ref 11.5–15.5)
WBC: 9.9 10*3/uL (ref 4.0–10.5)

## 2016-06-11 LAB — BASIC METABOLIC PANEL
ANION GAP: 7 (ref 5–15)
BUN: 14 mg/dL (ref 6–20)
CALCIUM: 8.6 mg/dL — AB (ref 8.9–10.3)
CO2: 23 mmol/L (ref 22–32)
Chloride: 106 mmol/L (ref 101–111)
Creatinine, Ser: 0.62 mg/dL (ref 0.44–1.00)
Glucose, Bld: 163 mg/dL — ABNORMAL HIGH (ref 65–99)
POTASSIUM: 4.3 mmol/L (ref 3.5–5.1)
SODIUM: 136 mmol/L (ref 135–145)

## 2016-06-11 MED ORDER — DOCUSATE SODIUM 100 MG PO CAPS: 100.0000 mg | ORAL_CAPSULE | Freq: Two times a day (BID) | ORAL | 0 refills | Status: DC

## 2016-06-11 MED ORDER — ONDANSETRON HCL 4 MG PO TABS: 4.0000 mg | ORAL_TABLET | Freq: Four times a day (QID) | ORAL | 0 refills | Status: DC | PRN

## 2016-06-11 MED ORDER — SENNA 8.6 MG PO TABS: 2.0000 | ORAL_TABLET | Freq: Every day | ORAL | 0 refills | Status: DC

## 2016-06-11 MED ORDER — OXYCODONE HCL 5 MG PO TABS: 5.0000 mg | ORAL_TABLET | ORAL | 0 refills | Status: DC | PRN

## 2016-06-11 MED ORDER — APIXABAN 2.5 MG PO TABS: 2.5000 mg | ORAL_TABLET | Freq: Two times a day (BID) | ORAL | 0 refills | Status: DC

## 2016-06-11 MED ORDER — METHOCARBAMOL 500 MG PO TABS: 500.0000 mg | ORAL_TABLET | Freq: Four times a day (QID) | ORAL | 0 refills | Status: DC | PRN

## 2016-06-11 NOTE — Progress Notes (Signed)
   Subjective:  Patient reports pain as mild to moderate.  Nausea this am. Denies CP/SOB.  Objective:   VITALS:   Vitals:   06/10/16 2316 06/11/16 0139 06/11/16 0531 06/11/16 0937  BP: 114/60 (!) 106/56 (!) 111/48 120/62  Pulse: 75 75 71 70  Resp: 16 16 16 16   Temp: 98.1 F (36.7 C) 97.6 F (36.4 C) 98.3 F (36.8 C) 98.8 F (37.1 C)  TempSrc: Oral Oral Oral Oral  SpO2: 97%  97% 97%  Weight:      Height:        NAD ABD soft Sensation intact distally Intact pulses distally Dorsiflexion/Plantar flexion intact Incision: dressing C/D/I Compartment soft HV ss   Lab Results  Component Value Date   WBC 9.9 06/11/2016   HGB 9.9 (L) 06/11/2016   HCT 28.4 (L) 06/11/2016   MCV 88.2 06/11/2016   PLT 259 06/11/2016   BMET    Component Value Date/Time   NA 136 06/11/2016 0426   K 4.3 06/11/2016 0426   CL 106 06/11/2016 0426   CO2 23 06/11/2016 0426   GLUCOSE 163 (H) 06/11/2016 0426   BUN 14 06/11/2016 0426   CREATININE 0.62 06/11/2016 0426   CREATININE 0.66 09/19/2015 0957   CALCIUM 8.6 (L) 06/11/2016 0426   GFRNONAA >60 06/11/2016 0426   GFRAA >60 06/11/2016 0426     Assessment/Plan: 1 Day Post-Op   Principal Problem:   Osteoarthritis of left knee   WBAT LLE DVT ppx: apixaban, SCDs, TEDs PO pain control PT/OT D/C HV drain Nausea: antiemetics, clears for now - advance as tolerated Dispo: D/C home tomorrow   Elie Goody 06/11/2016, 12:41 PM   Rod Can, MD Cell [phone]

## 2016-06-11 NOTE — Progress Notes (Signed)
Location of Breast Cancer: upper inner quadrant of the right breast   Histology per Pathology Report:   06/02/16 Diagnosis 1. Breast, lumpectomy, Right w/seed - INVASIVE DUCTAL CARCINOMA, GRADE 2, SPANNING 0.3 CM. - HIGH GRADE DUCTAL CARCINOMA IN SITU WITH NECROSIS. - INVASIVE CARCINOMA COMES TO WITHIN <0.1 CM OF THE POSTERIOR MARGIN FOCALLY. - DUCTAL CARCINOMA IN SITU COMES TO WITHIN <0.1 CM OF THE POSTERIOR MARGIN FOCALLY AND THE SUPERIOR MARGIN FOCALLY. - BIOPSY SITE. - SEE ONCOLOGY TABLE. 2. Breast, excision, Right additional Medial Margin - FIBROCYSTIC CHANGE. - USUAL DUCTAL HYPERPLASIA. - NO MALIGNANCY IDENTIFIED. 3. Lymph node, sentinel, biopsy, Right Axillary #1 - ONE OF ONE LYMPH NODES NEGATIVE FOR CARCINOMA (0/1). 4. Lymph node, sentinel, biopsy, Right Axillary - ONE OF ONE LYMPH NODES NEGATIVE FOR CARCINOMA (0/1).  05/19/16 Diagnosis Breast, right, needle core biopsy, UIQ - DUCTAL CARCINOMA WITH CALCIFICATIONS. - SEE COMMENT.  Receptor Status: ER(0), PR (0), Her2-neu (positive), Ki-(10%)  Did patient present with symptoms (if so, please note symptoms) or was this found on screening mammography?: screening mammogram    Past/Anticipated interventions by surgeon, if any: 05/19/16 - Procedure: RIGHT BREAST LUMPECTOMY WITH RADIOACTIVE SEED AND SENTINEL LYMPH NODE BIOPSY;  Surgeon: [name], MD   Past/Anticipated interventions by medical oncology, if any:   Lymphedema issues, if any:  no    Pain issues, if any:  Had left knee surgery on 2/1 so has pain at a 3/10 today.  She is taking oxycodone 5 mg q 3 hours.  SAFETY ISSUES:  Prior radiation? no  Pacemaker/ICD? no  Possible current pregnancy?no  Is the patient on methotrexate? no  Current Complaints / other details:  Patient is here with her husband.  She reports that she will need a re excision of her right breast.  She will see Dr. Barry Dienes 06/25/16.  Patient reports having constipation with her last bm over a  week ago.  She is taking colace and milk of magnesia.  Constipation handout given.  BP (!) 110/44 (BP Location: Left Arm, Patient Position: Sitting)   Pulse 84   Temp 98.6 F (37 C) (Oral)   Ht _0  (1.6 m)   Wt 155 lb (70.3 kg)   LMP 12/12/1996 (Exact Date)   SpO2 95%   BMI 27.46 kg/m    Wt Readings from Last 3 Encounters:  06/16/16 155 lb (70.3 kg)  06/10/16 152 lb (68.9 kg)  06/04/16 152 lb 3.2 oz (69 kg)      Jacqulyn Liner, RN 06/11/2016,12:21 PM

## 2016-06-11 NOTE — Progress Notes (Signed)
Chaplain went to see patient.  From reading her chart, she has received a recent report of cancer.  Patient said everything she had been told was overwhelming for her.  Patient stated that she went home and cried. Patient shared with me about her basil cell cancer that she had removed and that she has to have full body checks once a year.  Patient says she has a strong prayer support system and that her church family is there for her and is praying for her.   Chaplain and patient had a long discussion about her knees and her cancer and how she thought it was a lot for her all at one time.  3 mammograms in December and then again in January.  Patient is a little worried about her daughter being 17 and now having to be careful.    Even though she is emotional she is very excited to be able to walk and go home on tomorrow.  She said she has been told to slow down and not try to walk too fast.  She is very excited and walks very fast.    Patient is appreciative of her team of nurses that have been taking care of her.  Keasha Malkiewicz, [place] Resident Pager:  [phone]    06/11/16 1526  Clinical Encounter Type  Visited With Patient  Visit Type Initial  Spiritual Encounters  Spiritual Needs Emotional  Stress Factors  Patient Stress Factors Major life changes (new diagnosis)

## 2016-06-11 NOTE — Progress Notes (Signed)
Physical Therapy Treatment Patient Details Name: Dawn West MRN: [id number] DOB: Dec 28, 1950 Today's Date: 06/11/2016    History of Present Illness s/p L TKA, recent breast CA sx    PT Comments    Marked improvement in activity tolerance with no c/o dizziness and pain well controlled  Follow Up Recommendations  Outpatient PT     Equipment Recommendations  None recommended by PT    Recommendations for Other Services OT consult     Precautions / Restrictions Precautions Precautions: Fall;Knee Restrictions Weight Bearing Restrictions: No Other Position/Activity Restrictions: WBAt    Mobility  Bed Mobility Overal bed mobility: Needs Assistance Bed Mobility: Supine to Sit     Supine to sit: Min assist;HOB elevated     General bed mobility comments: NT - OOB on arrival and left in bathroom with CNA at session end  Transfers Overall transfer level: Needs assistance Equipment used: Rolling walker (2 wheeled) Transfers: Sit to/from Stand Sit to Stand: Min assist         General transfer comment: cues for UE/LE placement  Ambulation/Gait Ambulation/Gait assistance: Min assist;Min guard Ambulation Distance (Feet): 200 Feet Assistive device: Rolling walker (2 wheeled) Gait Pattern/deviations: Step-to pattern;Step-through pattern;Decreased step length - right;Decreased step length - left;Shuffle;Trunk flexed Gait velocity: decr Gait velocity interpretation: Below normal speed for age/gender General Gait Details: cues for sequence, posture and position from RW.    Stairs            Wheelchair Mobility    Modified Rankin (Stroke Patients Only)       Balance                                    Cognition Arousal/Alertness: Awake/alert Behavior During Therapy: WFL for tasks assessed/performed                        Exercises Total Joint Exercises Ankle Circles/Pumps: AROM;Both;15 reps;Supine Quad Sets: AROM;Both;10  reps;Supine Heel Slides: AAROM;Supine;Left;15 reps Straight Leg Raises: AAROM;AROM;Left;15 reps;Supine    General Comments        Pertinent Vitals/Pain Pain Assessment: 0-10 Pain Score: 4  Pain Location: L knee Pain Descriptors / Indicators: Aching;Sore Pain Intervention(s): Limited activity within patient's tolerance;Monitored during session;Premedicated before session    [place] expects to be discharged to:: Private residence Living Arrangements: Spouse/significant other Available Help at Discharge: Family Type of Home: House Home Access: Stairs to enter Entrance Stairs-Rails: None Home Layout: Able to live on main level with bedroom/bathroom Home Equipment: Environmental consultant - 2 wheels Additional Comments: has AE from mother    Prior Function Level of Independence: Needs assistance  Gait / Transfers Assistance Needed: utilizing cane or RW as needed ADL's / Homemaking Assistance Needed: for the past week, assistance for ADL after breast sx     PT Goals (current goals can now be found in the care plan section) Acute Rehab PT Goals Patient Stated Goal: Regain IND PT Goal Formulation: With patient Time For Goal Achievement: 06/18/16 Potential to Achieve Goals: Good Progress towards PT goals: Progressing toward goals    Frequency    7X/week      PT Plan Current plan remains appropriate    Co-evaluation             End of Session Equipment Utilized During Treatment: Gait belt Activity Tolerance: Patient tolerated treatment well Patient left: Other (comment) (bathroom with CNA)  Time: [id number] PT Time Calculation (min) (ACUTE ONLY): 23 min  Charges:  $Gait Training: 23-37 mins $Therapeutic Exercise: 8-22 mins                    G Codes:      Jamicia Haaland 06-18-2016, 3:33 PM

## 2016-06-11 NOTE — Discharge Instructions (Signed)
°Dr. Brian Swinteck °Total Joint Specialist °[facility] °[address] °[place], [place] [address] °[phone] ° °TOTAL KNEE REPLACEMENT POSTOPERATIVE DIRECTIONS ° ° ° °Knee Rehabilitation, Guidelines Following Surgery  °Results after knee surgery are often greatly improved when you follow the exercise, range of motion and muscle strengthening exercises prescribed by your doctor. Safety measures are also important to protect the knee from further injury. Any time any of these exercises cause you to have increased pain or swelling in your knee joint, decrease the amount until you are comfortable again and slowly increase them. If you have problems or questions, call your caregiver or physical therapist for advice.  ° °WEIGHT BEARING °Weight bearing as tolerated with assist device (walker, cane, etc) as directed, use it as long as suggested by your surgeon or therapist, typically at least 4-6 weeks. ° °HOME CARE INSTRUCTIONS  °Remove items at home which could result in a fall. This includes throw rugs or furniture in walking pathways.  °Continue medications as instructed at time of discharge. °You may have some home medications which will be placed on hold until you complete the course of blood thinner medication.  °You may start showering once you are discharged home but do not submerge the incision under water. Just pat the incision dry and apply a dry gauze dressing on daily. °Walk with walker as instructed.  °You may resume a sexual relationship in one month or when given the OK by your doctor.  °· Use walker as long as suggested by your caregivers. °· Avoid periods of inactivity such as sitting longer than an hour when not asleep. This helps prevent blood clots.  °You may put full weight on your legs and walk as much as is comfortable.  °You may return to work once you are cleared by your doctor.  °Do not drive a car for 6 weeks or until released by you surgeon.  °· Do not drive while  taking narcotics.  °Wear the elastic stockings for three weeks following surgery during the day but you may remove then at night. °Make sure you keep all of your appointments after your operation with all of your doctors and caregivers. You should call the office at the above phone number and make an appointment for approximately two weeks after the date of your surgery. °Do not remove your surgical dressing. The dressing is waterproof; you may take showers in 3 days, but do not take tub baths or submerge the dressing. °Please pick up a stool softener and laxative for home use as long as you are requiring pain medications. °· ICE to the affected knee every three hours for 30 minutes at a time and then as needed for pain and swelling.  Continue to use ice on the knee for pain and swelling from surgery. You may notice swelling that will progress down to the foot and ankle.  This is normal after surgery.  Elevate the leg when you are not up walking on it.   °It is important for you to complete the blood thinner medication as prescribed by your doctor. °· Continue to use the breathing machine which will help keep your temperature down.  It is common for your temperature to cycle up and down following surgery, especially at night when you are not up moving around and exerting yourself.  The breathing machine keeps your lungs expanded and your temperature down. ° °RANGE OF MOTION AND STRENGTHENING EXERCISES  °Rehabilitation of the knee is important following a   knee injury or an operation. After just a few days of immobilization, the muscles of the thigh which control the knee become weakened and shrink (atrophy). Knee exercises are designed to build up the tone and strength of the thigh muscles and to improve knee motion. Often times heat used for twenty to thirty minutes before working out will loosen up your tissues and help with improving the range of motion but do not use heat for the first two weeks following  surgery. These exercises can be done on a training (exercise) mat, on the floor, on a table or on a bed. Use what ever works the best and is most comfortable for you Knee exercises include:  Leg Lifts - While your knee is still immobilized in a splint or cast, you can do straight leg raises. Lift the leg to 60 degrees, hold for 3 sec, and slowly lower the leg. Repeat 10-20 times 2-3 times daily. Perform this exercise against resistance later as your knee gets better.  Quad and Hamstring Sets - Tighten up the muscle on the front of the thigh (Quad) and hold for 5-10 sec. Repeat this 10-20 times hourly. Hamstring sets are done by pushing the foot backward against an object and holding for 5-10 sec. Repeat as with quad sets.  A rehabilitation program following serious knee injuries can speed recovery and prevent re-injury in the future due to weakened muscles. Contact your doctor or a physical therapist for more information on knee rehabilitation.   SKILLED REHAB INSTRUCTIONS: If the patient is transferred to a skilled rehab facility following release from the hospital, a list of the current medications will be sent to the facility for the patient to continue.  When discharged from the skilled rehab facility, please have the facility set up the patient's [place] prior to being released. Also, the skilled facility will be responsible for providing the patient with their medications at time of release from the facility to include their pain medication, the muscle relaxants, and their blood thinner medication. If the patient is still at the rehab facility at time of the two week follow up appointment, the skilled rehab facility will also need to assist the patient in arranging follow up appointment in our office and any transportation needs.  MAKE SURE YOU:  Understand these instructions.  Will watch your condition.  Will get help right away if you are not doing well or get worse.     Pick up stool softner and laxative for home use following surgery while on pain medications. Do NOT remove your dressing. You may shower.  Do not take tub baths or submerge incision under water. May shower starting three days after surgery. Please use a clean towel to pat the incision dry following showers. Continue to use ice for pain and swelling after surgery. Do not use any lotions or creams on the incision until instructed by your surgeon.     Information on my medicine - ELIQUIS (apixaban)  This medication education was reviewed with me or my healthcare representative as part of my discharge preparation.  The pharmacist that spoke with me during my hospital stay was:  [facility], Student-PharmD  Why was Eliquis prescribed for you? Eliquis was prescribed for you to reduce the risk of blood clots forming after orthopedic surgery.    What do You need to know about Eliquis? Take your Eliquis TWICE DAILY - one tablet in the morning and one tablet in the evening with or without  food.  It would be best to take the dose about the same time each day.  If you have difficulty swallowing the tablet whole please discuss with your pharmacist how to take the medication safely.  Take Eliquis exactly as prescribed by your doctor and DO NOT stop taking Eliquis without talking to the doctor who prescribed the medication.  Stopping without other medication to take the place of Eliquis may increase your risk of developing a clot.  After discharge, you should have regular check-up appointments with your healthcare provider that is prescribing your Eliquis.  What do you do if you miss a dose? If a dose of ELIQUIS is not taken at the scheduled time, take it as soon as possible on the same day and twice-daily administration should be resumed.  The dose should not be doubled to make up for a missed dose.  Do not take more than one tablet of ELIQUIS at the same time.  Important Safety  Information A possible side effect of Eliquis is bleeding. You should call your healthcare provider right away if you experience any of the following: ? Bleeding from an injury or your nose that does not stop. ? Unusual colored urine (red or dark brown) or unusual colored stools (red or black). ? Unusual bruising for unknown reasons. ? A serious fall or if you hit your head (even if there is no bleeding).  Some medicines may interact with Eliquis and might increase your risk of bleeding or clotting while on Eliquis. To help avoid this, consult your healthcare provider or pharmacist prior to using any new prescription or non-prescription medications, including herbals, vitamins, non-steroidal anti-inflammatory drugs (NSAIDs) and supplements.  This website has more information on Eliquis (apixaban): http://www.eliquis.com/eliquis/home

## 2016-06-11 NOTE — Discharge Summary (Signed)
Physician Discharge Summary  Patient ID: Dawn West MRN: [id number] DOB/AGE: 1950/12/03 66 y.o.  Admit date: 06/10/2016 Discharge date: 06/12/2016  Admission Diagnoses:  Osteoarthritis of left knee  Discharge Diagnoses:  Principal Problem:   Osteoarthritis of left knee   Past Medical History:  Diagnosis Date  . Anxiety   . Arthritis    knees  . Cancer ([place]) 12/01/11   basal cell skin ca Left upper arm  . Cancer ([facility]) 04/20/2016   right breast cancer  . Complication of anesthesia   . Depression   . Hypercholesteremia   . PONV (postoperative nausea and vomiting)     Surgeries: Procedure(s): LEFT TOTAL KNEE ARTHROPLASTY WITH COMPUTER NAVIGATION on 06/10/2016   Consultants (if any):   Discharged Condition: Improved  Hospital Course: Dawn West is an 66 y.o. female who was admitted 06/10/2016 with a diagnosis of Osteoarthritis of left knee and went to the operating room on 06/10/2016 and underwent the above named procedures.    She was given perioperative antibiotics:  Anti-infectives    Start     Dose/Rate Route Frequency Ordered Stop   06/11/16 0600  vancomycin (VANCOCIN) IVPB 1000 mg/200 mL premix     1,000 mg 200 mL/hr over 60 Minutes Intravenous On call to O.R. 06/10/16 1335 06/10/16 1613   06/11/16 0300  vancomycin (VANCOCIN) IVPB 1000 mg/200 mL premix     1,000 mg 200 mL/hr over 60 Minutes Intravenous Every 12 hours 06/10/16 2019 06/11/16 0442    .  She was given sequential compression devices, early ambulation, and apixaban for DVT prophylaxis.  She benefited maximally from the hospital stay and there were no complications.    Recent vital signs:  Vitals:   06/11/16 2143 06/12/16 0558  BP: 127/65 (!) 108/49  Pulse: 78 71  Resp: 16 16  Temp: 98 F (36.7 C) 98.5 F (36.9 C)    Recent laboratory studies:  Lab Results  Component Value Date   HGB 8.4 (L) 06/12/2016   HGB 9.9 (L) 06/11/2016   HGB 11.4 (L) 06/04/2016   Lab Results  Component Value  Date   WBC 10.7 (H) 06/12/2016   PLT 228 06/12/2016   No results found for: INR Lab Results  Component Value Date   NA 136 06/11/2016   K 4.3 06/11/2016   CL 106 06/11/2016   CO2 23 06/11/2016   BUN 14 06/11/2016   CREATININE 0.62 06/11/2016   GLUCOSE 163 (H) 06/11/2016    Discharge Medications:   Allergies as of 06/12/2016      Reactions   Allegra [fexofenadine Hcl]    Abdominal pain   Ciprofloxacin    Estradiol Other (See Comments)   Pt is too no longer receive any hormonal therapy due to breast cancer   Penicillins Rash   Sulfa Antibiotics Rash      Medication List    STOP taking these medications   acetaminophen 650 MG CR tablet Commonly known as:  TYLENOL   diclofenac 75 MG EC tablet Commonly known as:  VOLTAREN     TAKE these medications   apixaban 2.5 MG Tabs tablet Commonly known as:  ELIQUIS Take 1 tablet (2.5 mg total) by mouth every 12 (twelve) hours.   CALCIUM 600 + D 600-200 MG-UNIT Tabs Generic drug:  Calcium Carb-Cholecalciferol Take 1 tablet by mouth 2 (two) times daily.   cetirizine 10 MG tablet Commonly known as:  ZYRTEC Take 10 mg by mouth daily. At 10 pm daily   docusate sodium  100 MG capsule Commonly known as:  COLACE Take 1 capsule (100 mg total) by mouth 2 (two) times daily.   EQ NIGHTTIME SLEEP AID 25 MG tablet Generic drug:  diphenhydrAMINE Take 25 mg by mouth at bedtime as needed for sleep.   Fish Oil 1000 MG Caps Take 1,000 mg by mouth 3 (three) times daily. Takes 1-3 times daily   fluticasone 50 MCG/ACT nasal spray Commonly known as:  FLONASE Place 1 spray into both nostrils 2 (two) times daily as needed for allergies.   Magnesium 250 MG Tabs Take 250 mg by mouth 3 (three) times daily.   methocarbamol 500 MG tablet Commonly known as:  ROBAXIN Take 1 tablet (500 mg total) by mouth every 6 (six) hours as needed for muscle spasms.   MOVE FREE JOINT HEALTH ADVANCE PO Take 1 tablet by mouth daily.   multivitamin  tablet Take 1 tablet by mouth daily.   ondansetron 4 MG tablet Commonly known as:  ZOFRAN Take 1 tablet (4 mg total) by mouth every 6 (six) hours as needed for nausea.   oxyCODONE 5 MG immediate release tablet Commonly known as:  Oxy IR/ROXICODONE Take 1-2 tablets (5-10 mg total) by mouth every 3 (three) hours as needed for breakthrough pain. What changed:  when to take this  reasons to take this   pravastatin 40 MG tablet Commonly known as:  PRAVACHOL Take 1 tablet (40 mg total) by mouth daily. What changed:  additional instructions   senna 8.6 MG Tabs tablet Commonly known as:  SENOKOT Take 2 tablets (17.2 mg total) by mouth at bedtime.   sertraline 100 MG tablet Commonly known as:  ZOLOFT Take 1 tablet (100 mg total) by mouth daily. What changed:  when to take this  additional instructions   sodium chloride 0.65 % Soln nasal spray Commonly known as:  OCEAN Place 1 spray into both nostrils 2 (two) times daily as needed for congestion.   Vitamin D (Ergocalciferol) 50000 units Caps capsule Commonly known as:  DRISDOL Take 1 capsule (50,000 Units total) by mouth every 7 (seven) days. What changed:  additional instructions   vitamin E 400 UNIT capsule Take 400 Units by mouth 3 (three) times daily.   zinc gluconate 50 MG tablet Take 50 mg by mouth daily.       Diagnostic Studies: Mm Breast Surgical Specimen  Result Date: 06/02/2016 CLINICAL DATA:  Right breast cancer status post right lumpectomy. EXAM: SPECIMEN RADIOGRAPH OF THE RIGHT BREAST COMPARISON:  Previous exam(s). FINDINGS: Status post excision of the right breast. The radioactive seed and biopsy marker clip are present, completely intact, and were marked for pathology. IMPRESSION: Specimen radiograph of the right breast. Electronically Signed   By: Abelardo Diesel M.D.   On: 06/02/2016 14:20   Dg Knee Left Port  Result Date: 06/10/2016 CLINICAL DATA:  Status post left knee arthroplasty. Initial encounter.  EXAM: PORTABLE LEFT KNEE - 1-2 VIEW COMPARISON:  None. FINDINGS: The patient is status post left knee arthroplasty. There is no evidence of loosening or new fracture. An associated drainage catheter is noted. Scattered postoperative soft tissue air is seen. The drainage catheter ends about Hoffa's fat pad. IMPRESSION: Status post left knee arthroplasty. No evidence of loosening or new fracture. Electronically Signed   By: Garald Balding M.D.   On: 06/10/2016 19:37   Mm Digital Diagnostic Unilat R  Result Date: 05/18/2016 CLINICAL DATA:  66 year old female for further evaluation of right breast calcifications identified on screening mammogram. EXAM:  DIGITAL DIAGNOSTIC RIGHT MAMMOGRAM WITH CAD COMPARISON:  Previous exam(s). ACR Breast Density Category b: There are scattered areas of fibroglandular density. FINDINGS: Full field and magnification views of the right breast demonstrate heterogeneous calcifications in a linear orientation within the posterior upper right breast and spanning a distance of 2.2 cm. No associated mass identified. Mammographic images were processed with CAD. IMPRESSION: Suspicious calcifications within the upper right breast. Tissue sampling is recommended. RECOMMENDATION: Stereotactic guided right breast biopsy which will be arranged. I have discussed the findings and recommendations with the patient. Results were also provided in writing at the conclusion of the visit. If applicable, a reminder letter will be sent to the patient regarding the next appointment. BI-RADS CATEGORY  4: Suspicious. Electronically Signed   By: Margarette Canada M.D.   On: 05/18/2016 12:18   Mm Rt Radioactive Seed Loc Mammo Guide  Result Date: 06/01/2016 CLINICAL DATA:  Patient presents for radioactive seed localization of a right breast high grade ductal carcinoma. EXAM: MAMMOGRAPHIC GUIDED RADIOACTIVE SEED LOCALIZATION OF THE RIGHT BREAST COMPARISON:  Previous exam(s). FINDINGS: Patient presents for radioactive  seed localization prior to surgical excision. I met with the patient and we discussed the procedure of seed localization including benefits and alternatives. We discussed the high likelihood of a successful procedure. We discussed the risks of the procedure including infection, bleeding, tissue injury and further surgery. We discussed the low dose of radioactivity involved in the procedure. Informed, written consent was given. The usual time-out protocol was performed immediately prior to the procedure. Using mammographic guidance, sterile technique, 1% lidocaine and an I-125 radioactive seed, the coil shaped biopsy clip was localized using a medial approach. The follow-up mammogram images confirm the seed in the expected location and were marked for Dr. Barry Dienes. Follow-up survey of the patient confirms presence of the radioactive seed. Order number of I-125 seed:  [id number]. Total activity:  2.229 millicuries  Reference Date: 04/16/2016 The patient tolerated the procedure well and was released from the [facility]. She was given instructions regarding seed removal. IMPRESSION: Radioactive seed localization of the right breast. No apparent complications. Electronically Signed   By: Lajean Manes M.D.   On: 06/01/2016 11:24   Mm Clip Placement Right  Result Date: 05/19/2016 CLINICAL DATA:  Post stereotactic core needle biopsy of right breast upper inner quadrant calcifications. EXAM: DIAGNOSTIC RIGHT MAMMOGRAM POST STEREOTACTIC BIOPSY COMPARISON:  Previous exam(s). FINDINGS: Mammographic images were obtained following stereotactic guided biopsy of right breast calcifications. Two-view mammography demonstrates presence of a coil shaped marker within the slightly upper inner quadrant of the right breast, posterior depth. The marker is noted to be 1.8 cm inferior from the residual calcifications on the ML view. IMPRESSION: Placement of coil shaped marker post stereotactic core needle biopsy of the right breast. The  marker is 1.8 cm inferior to the residual calcifications. Final Assessment: Post Procedure Mammograms for Marker Placement Electronically Signed   By: Fidela Salisbury M.D.   On: 05/19/2016 14:44   Mm Rt Breast Bx W Loc Dev 1st Lesion Image Bx Spec Stereo Guide  Addendum Date: 05/20/2016   ADDENDUM REPORT: 05/20/2016 14:27 ADDENDUM: Pathology revealed HIGH GRADE DUCTAL CARCINOMA WITH CALCIFICATIONS of the Right breast, upper inner quadrant. The carcinoma is predominantly in situ and is high grade. There are foci highly suspicious for stromal invasion. This was found to be concordant by Dr. Fidela Salisbury. Pathology results were discussed with the patient by telephone. The patient reported doing well after the biopsy with tenderness  at the site. Post biopsy instructions and care were reviewed and questions were answered. The patient was encouraged to call [facility] for any additional concerns. The patient was referred to [facility] on May 26, 2016. Pathology results reported by Terie Purser, RN on 05/20/2016. Electronically Signed   By: Fidela Salisbury M.D.   On: 05/20/2016 14:27   Result Date: 05/20/2016 CLINICAL DATA:  Right breast slightly upper inner quadrant group of calcifications. EXAM: RIGHT BREAST STEREOTACTIC CORE NEEDLE BIOPSY COMPARISON:  Previous exams. FINDINGS: The patient and I discussed the procedure of stereotactic-guided biopsy including benefits and alternatives. We discussed the high likelihood of a successful procedure. We discussed the risks of the procedure including infection, bleeding, tissue injury, clip migration, and inadequate sampling. Informed written consent was given. The usual time out protocol was performed immediately prior to the procedure. Using sterile technique and 1% Lidocaine as local anesthetic, under stereotactic guidance, a 9 gauge vacuum assisted  device was used to perform core needle biopsy of calcifications in the slightly upper inner quadrant of the right breast, posterior depth using a superior approach. Specimen radiograph was performed showing presence of calcifications. Specimens with calcifications are identified for pathology. At the conclusion of the procedure, a coil shaped tissue marker clip was deployed into the biopsy cavity. Follow-up 2-view mammogram was performed and dictated separately. IMPRESSION: Stereotactic-guided biopsy of right breast calcifications. No apparent complications. Electronically Signed: By: Fidela Salisbury M.D. On: 05/19/2016 14:41    Disposition: 01-Home or Self Care  Discharge Instructions    Call MD / Call 911    Complete by:  As directed    If you experience chest pain or shortness of breath, CALL 911 and be transported to the hospital emergency room.  If you develope a fever above 101 F, pus (white drainage) or increased drainage or redness at the wound, or calf pain, call your surgeon's office.   Constipation Prevention    Complete by:  As directed    Drink plenty of fluids.  Prune juice may be helpful.  You may use a stool softener, such as Colace (over the counter) 100 mg twice a day.  Use MiraLax (over the counter) for constipation as needed.   Diet - low sodium heart healthy    Complete by:  As directed    Do not put a pillow under the knee. Place it under the heel.    Complete by:  As directed    Driving restrictions    Complete by:  As directed    No driving for 6 weeks   Increase activity slowly as tolerated    Complete by:  As directed    Lifting restrictions    Complete by:  As directed    No lifting for 6 weeks   TED hose    Complete by:  As directed    Use stockings (TED hose) for 2 weeks on both leg(s).  You may remove them at night for sleeping.      Follow-up Information    Geneveive Furness, Horald Pollen, MD. Schedule an appointment as soon as possible for a visit in 2  week(s).   Specialty:  Orthopedic Surgery Why:  For wound re-check Contact information: [place]. [address] [address] [phone]            Signed: Elie Goody 06/12/2016, 8:11 AM

## 2016-06-11 NOTE — Addendum Note (Signed)
Addendum  created 06/11/16 [id number] by Lollie Sails, CRNA   Charge Capture section accepted

## 2016-06-11 NOTE — Evaluation (Signed)
Physical Therapy Evaluation Patient Details Name: Dawn West MRN: [id number] DOB: Sep 19, 1950 Today's Date: 06/11/2016   History of Present Illness  s/p L TKA, recent breast CA sx  Clinical Impression  Pt s/p L TKR and presents with decreased L LE strength/ROM and post op pain limiting functional mobility.  Pt should progress to dc home with family assist and OP PT follow up starting 06/14/16.    Follow Up Recommendations Outpatient PT (Per physician)    Equipment Recommendations  None recommended by PT    Recommendations for Other Services OT consult     Precautions / Restrictions Precautions Precautions: Fall;Knee Restrictions Weight Bearing Restrictions: No      Mobility  Bed Mobility Overal bed mobility: Needs Assistance Bed Mobility: Supine to Sit     Supine to sit: Min assist;HOB elevated     General bed mobility comments: cues for sequence and use of R LE to self assist  Transfers Overall transfer level: Needs assistance Equipment used: Rolling walker (2 wheeled) Transfers: Sit to/from Stand Sit to Stand: Min assist         General transfer comment: cues for UE/LE placement  Ambulation/Gait Ambulation/Gait assistance: Min assist Ambulation Distance (Feet): 18 Feet Assistive device: Rolling walker (2 wheeled) Gait Pattern/deviations: Step-to pattern;Decreased step length - right;Decreased step length - left;Shuffle;Trunk flexed Gait velocity: decr Gait velocity interpretation: Below normal speed for age/gender General Gait Details: cues for sequence, posture and position from RW.  Distance ltd by c/o dizziness  Stairs            Wheelchair Mobility    Modified Rankin (Stroke Patients Only)       Balance                                             Pertinent Vitals/Pain Pain Assessment: 0-10 Pain Score: 4  Faces Pain Scale: Hurts little more Pain Location: L knee Pain Descriptors / Indicators: Aching;Sore Pain  Intervention(s): Limited activity within patient's tolerance;Monitored during session;Premedicated before session;Ice applied    Home Living Family/patient expects to be discharged to:: Private residence Living Arrangements: Spouse/significant other Available Help at Discharge: Family Type of Home: House Home Access: Stairs to enter Entrance Stairs-Rails: None Technical brewer of Steps: 4 Home Layout: Able to live on main level with bedroom/bathroom Home Equipment: Environmental consultant - 2 wheels Additional Comments: has AE from mother    Prior Function Level of Independence: Needs assistance   Gait / Transfers Assistance Needed: utilizing cane or RW as needed  ADL's / Homemaking Assistance Needed: for the past week, assistance for ADL after breast sx        Hand Dominance   Dominant Hand: Right    Extremity/Trunk Assessment   Upper Extremity Assessment Upper Extremity Assessment: Overall WFL for tasks assessed    Lower Extremity Assessment Lower Extremity Assessment: LLE deficits/detail;RLE deficits/detail RLE Deficits / Details: Strength 4/5 with AROM at knee -10-  90 LLE Deficits / Details: 3-/5 quads with AAROM at knee -10 - 40       Communication   Communication: No difficulties  Cognition Arousal/Alertness: Awake/alert Behavior During Therapy: WFL for tasks assessed/performed Overall Cognitive Status: Impaired/Different from baseline                 General Comments: slow processing at times--only following one step commands.      General Comments  Exercises Total Joint Exercises Ankle Circles/Pumps: AROM;Both;15 reps;Supine Quad Sets: AROM;Both;10 reps;Supine Heel Slides: AAROM;Supine;Left;15 reps Straight Leg Raises: AAROM;AROM;Left;15 reps;Supine   Assessment/Plan    PT Assessment Patient needs continued PT services  PT Problem List Decreased strength;Decreased range of motion;Decreased activity tolerance;Decreased mobility;Decreased knowledge  of use of DME;Pain          PT Treatment Interventions DME instruction;Gait training;Stair training;Functional mobility training;Therapeutic activities;Therapeutic exercise;Patient/family education    PT Goals (Current goals can be found in the Care Plan section)  Acute Rehab PT Goals Patient Stated Goal: Regain IND PT Goal Formulation: With patient Time For Goal Achievement: 06/18/16 Potential to Achieve Goals: Good    Frequency 7X/week   Barriers to discharge        Co-evaluation               End of Session Equipment Utilized During Treatment: Gait belt Activity Tolerance: Patient tolerated treatment well Patient left: in chair;with call bell/phone within reach;with nursing/sitter in room Nurse Communication: Mobility status         Time: [id number] PT Time Calculation (min) (ACUTE ONLY): 47 min   Charges:   PT Evaluation $PT Eval Low Complexity: 1 Procedure PT Treatments $Gait Training: 8-22 mins $Therapeutic Exercise: 8-22 mins   PT G Codes:        Jalisa Sacco 06/13/16, 12:53 PM

## 2016-06-11 NOTE — Evaluation (Signed)
Occupational Therapy Evaluation Patient Details Name: Dawn West MRN: [id number] DOB: 1950/12/18 Today's Date: 06/11/2016    History of Present Illness s/p L TKA, recent breast CA sx   Clinical Impression   This 66 year old female was admitted for the above sx. She will benefit from continued OT in acute setting to further educate on bathroom transfers and DME. Goals are for supervision to min guard.  Husband has been assisting for adls after recent sx and will continue to do so.    Follow Up Recommendations  Supervision/Assistance - 24 hour    Equipment Recommendations   (pt is deciding about 3:1 for night use)    Recommendations for Other Services       Precautions / Restrictions Precautions Precautions: Fall;Knee Restrictions Weight Bearing Restrictions: No      Mobility Bed Mobility               General bed mobility comments: oob by PT  Transfers Overall transfer level: Needs assistance Equipment used: Rolling walker (2 wheeled) Transfers: Sit to/from Stand Sit to Stand: Min guard         General transfer comment: cues for UE/LE placement    Balance                                            ADL Overall ADL's : Needs assistance/impaired     Grooming: Set up;Sitting;Standing;Supervision/safety;Oral care;Wash/dry hands;Wash/dry face (min A for deodorant due to recent breast sx/steristrips)   Upper Body Bathing: Minimal assistance;Sitting   Lower Body Bathing: Moderate assistance;Sit to/from stand   Upper Body Dressing : Minimal assistance;Sitting   Lower Body Dressing: Maximal assistance;Sit to/from stand                 General ADL Comments: performed ADL from recliner and stood at sink to brush teeth.  Pt just had catheter removed prior to OT.  Husband has been assisting with adls. She is not interested in AE but has most DME.  She is thinking about whether or not she would want a 3:1 for use at night. She has  toilet riser at home.  Pt had nausea before session and during session also.  Premedicated for this and did not vomit during OT     Vision     Perception     Praxis      Pertinent Vitals/Pain Pain Assessment: Faces Faces Pain Scale: Hurts little more Pain Location: L knee Pain Intervention(s): Limited activity within patient's tolerance;Monitored during session;Premedicated before session;Repositioned;Ice applied     Hand Dominance Right   Extremity/Trunk Assessment Upper Extremity Assessment Upper Extremity Assessment: Overall WFL for tasks assessed           Communication Communication Communication: No difficulties   Cognition Arousal/Alertness: Awake/alert Behavior During Therapy: WFL for tasks assessed/performed Overall Cognitive Status: Impaired/Different from baseline                 General Comments: slow processing at times--only following one step commands and repeated cues needed    General Comments       Exercises       Shoulder Instructions      Home Living Family/patient expects to be discharged to:: Private residence Living Arrangements: Spouse/significant other Available Help at Discharge: Family Type of Home: House  Bathroom Shower/Tub: Risk analyst characteristics: Architectural technologist: Standard     Home Equipment: Bedside commode;Tub bench   Additional Comments: has AE from mother      Prior Functioning/Environment Level of Independence: Needs assistance    ADL's / Homemaking Assistance Needed: for the past week, assistance for ADL after breast sx            OT Problem List: Decreased strength;Decreased activity tolerance;Decreased knowledge of use of DME or AE;Pain   OT Treatment/Interventions: Self-care/ADL training;DME and/or AE instruction;Patient/family education    OT Goals(Current goals can be found in the care plan section) Acute Rehab OT Goals Patient Stated Goal: less pain  and nausea OT Goal Formulation: With patient Time For Goal Achievement: 06/18/16 Potential to Achieve Goals: Good ADL Goals Pt Will Transfer to Toilet: with supervision;ambulating;bedside commode Pt Will Perform Toileting - Clothing Manipulation and hygiene: with supervision;sit to/from stand Pt Will Perform Tub/Shower Transfer: with min assist;Tub transfer;tub bench;ambulating  OT Frequency: Min 2X/week   Barriers to D/C:            Co-evaluation              End of Session    Activity Tolerance: Patient tolerated treatment well Patient left: in chair;with call bell/phone within reach;with chair alarm set   Time: [id number] OT Time Calculation (min): 28 min Charges:  OT General Charges $OT Visit: 1 Procedure OT Evaluation $OT Eval Low Complexity: 1 Procedure OT Treatments $Self Care/Home Management : 8-22 mins G-Codes:    Burlin Mcnair Jun 13, 2016, 11:34 AM Lesle Chris, OTR/L [phone] June 13, 2016

## 2016-06-12 LAB — CBC
HCT: 25.2 % — ABNORMAL LOW (ref 36.0–46.0)
Hemoglobin: 8.4 g/dL — ABNORMAL LOW (ref 12.0–15.0)
MCH: 30.1 pg (ref 26.0–34.0)
MCHC: 33.3 g/dL (ref 30.0–36.0)
MCV: 90.3 fL (ref 78.0–100.0)
PLATELETS: 228 10*3/uL (ref 150–400)
RBC: 2.79 MIL/uL — ABNORMAL LOW (ref 3.87–5.11)
RDW: 12.6 % (ref 11.5–15.5)
WBC: 10.7 10*3/uL — AB (ref 4.0–10.5)

## 2016-06-12 NOTE — Progress Notes (Signed)
Physical Therapy Treatment Patient Details Name: Dawn West MRN: [id number] DOB: 09/03/1950 Today's Date: 2016/06/25    History of Present Illness s/p L TKA, recent breast CA sx    PT Comments    Pt progressing well with mobility and hopeful for dc home this pm  Follow Up Recommendations  Outpatient PT     Equipment Recommendations  None recommended by PT    Recommendations for Other Services OT consult     Precautions / Restrictions Precautions Precautions: Fall;Knee Restrictions Weight Bearing Restrictions: No Other Position/Activity Restrictions: WBAt    Mobility  Bed Mobility Overal bed mobility: Needs Assistance Bed Mobility: Supine to Sit     Supine to sit: Supervision Sit to supine: Min assist;HOB elevated   General bed mobility comments: light min A for LLE  Transfers Overall transfer level: Needs assistance Equipment used: Rolling walker (2 wheeled) Transfers: Sit to/from Stand Sit to Stand: Supervision         General transfer comment: cues for UE placement  Ambulation/Gait Ambulation/Gait assistance: Min guard;Supervision Ambulation Distance (Feet): 300 Feet Assistive device: Rolling walker (2 wheeled) Gait Pattern/deviations: Step-to pattern;Step-through pattern;Decreased step length - right;Decreased step length - left;Shuffle;Trunk flexed Gait velocity: decr Gait velocity interpretation: Below normal speed for age/gender General Gait Details: cues for sequence, posture and position from RW.    Stairs            Wheelchair Mobility    Modified Rankin (Stroke Patients Only)       Balance                                    Cognition Arousal/Alertness: Awake/alert Behavior During Therapy: WFL for tasks assessed/performed Overall Cognitive Status: Within Functional Limits for tasks assessed                      Exercises Total Joint Exercises Ankle Circles/Pumps: AROM;Both;15 reps;Supine Quad  Sets: AROM;Both;Supine;15 reps Heel Slides: AAROM;Supine;Left;15 reps Straight Leg Raises: AAROM;AROM;Left;Supine;20 reps    General Comments        Pertinent Vitals/Pain Pain Assessment: 0-10 Pain Score: 3  Pain Location: L knee Pain Descriptors / Indicators: Aching;Sore Pain Intervention(s): Limited activity within patient's tolerance    Home Living                      Prior Function            PT Goals (current goals can now be found in the care plan section) Acute Rehab PT Goals Patient Stated Goal: Regain IND PT Goal Formulation: With patient Time For Goal Achievement: 06/18/16 Potential to Achieve Goals: Good Progress towards PT goals: Progressing toward goals    Frequency    7X/week      PT Plan Current plan remains appropriate    Co-evaluation             End of Session Equipment Utilized During Treatment: Gait belt Activity Tolerance: Patient tolerated treatment well Patient left: in bed;with call bell/phone within reach     Time: 1020-1100 PT Time Calculation (min) (ACUTE ONLY): 40 min  Charges:  $Gait Training: 23-37 mins $Therapeutic Exercise: 8-22 mins                    G Codes:      Ashby Leflore Jun 25, 2016, 12:26 PM

## 2016-06-12 NOTE — Progress Notes (Signed)
D/C instructions reviewed w/ pt and husband. Both verbalize understanding and all questions answered. Pt d/c in w/c in stable condition by NT to husband's car. Pt in possession of d/c instructions, scripts, and all personal belongings.

## 2016-06-12 NOTE — Progress Notes (Signed)
     Subjective: 2 Days Post-Op Procedure(s) (LRB): LEFT TOTAL KNEE ARTHROPLASTY WITH COMPUTER NAVIGATION (Left)   Patient reports pain as mild, pain controlled. No events throughout the night.  She feels that she is having more nausea after this surgery as compared to others she has had in the past.  She feels that the difference might be the amount of anesthesia she received.  We discussed medication to go home with iron for her hgb as we as Zofran for nausea.  Objective:   VITALS:   Vitals:   06/11/16 2143 06/12/16 0558  BP: 127/65 (!) 108/49  Pulse: 78 71  Resp: 16 16  Temp: 98 F (36.7 C) 98.5 F (36.9 C)    Dorsiflexion/Plantar flexion intact Incision: dressing C/D/I No cellulitis present Compartment soft  LABS  Recent Labs  06/11/16 0426 06/12/16 0533  HGB 9.9* 8.4*  HCT 28.4* 25.2*  WBC 9.9 10.7*  PLT 259 228     Recent Labs  06/11/16 0426  NA 136  K 4.3  BUN 14  CREATININE 0.62  GLUCOSE 163*     Assessment/Plan: 2 Days Post-Op Procedure(s) (LRB): LEFT TOTAL KNEE ARTHROPLASTY WITH COMPUTER NAVIGATION (Left) Up with therapy Discharge home Follow up in 2 weeks at [facility]. Follow up with Dr. Lyla Glassing in 2 weeks.  Contact information:  [facility] [address] [place]   PAC  06/12/2016, 4:28 PM

## 2016-06-12 NOTE — Progress Notes (Signed)
Occupational Therapy Treatment Patient Details Name: DERENDA MORRISS MRN: [id number] DOB: 25-Sep-1950 Today's Date: 06/12/2016    History of present illness s/p L TKA, recent breast CA sx   OT comments  All education completed this session.   Follow Up Recommendations  Supervision/Assistance - 24 hour    Equipment Recommendations  None recommended by OT    Recommendations for Other Services      Precautions / Restrictions Precautions Precautions: Fall;Knee Restrictions Weight Bearing Restrictions: No Other Position/Activity Restrictions: WBAt       Mobility Bed Mobility         Supine to sit: Min assist;HOB elevated Sit to supine: Min assist;HOB elevated   General bed mobility comments: light min A for LLE  Transfers   Equipment used: Rolling walker (2 wheeled)   Sit to Stand: Supervision         General transfer comment: cues for UE placement    Balance                                   ADL       Grooming: Wash/dry hands;Supervision/safety;Standing                   Toilet Transfer: Min guard;Ambulation;BSC;RW   Toileting- Clothing Manipulation and Hygiene: Supervision/safety;Sit to/from stand         General ADL Comments: demonstrated tub bench transfer: pt did not feel she needed to practice this      Vision                     Perception     Praxis      Cognition   Behavior During Therapy: [facility] for tasks assessed/performed Overall Cognitive Status: Within Functional Limits for tasks assessed                       Extremity/Trunk Assessment               Exercises     Shoulder Instructions       General Comments      Pertinent Vitals/ Pain       Pain Score: 3  Pain Location: L knee Pain Descriptors / Indicators: Aching;Sore Pain Intervention(s): Limited activity within patient's tolerance;Monitored during session;Premedicated before session;Repositioned;Ice applied  Home  Living                                          Prior Functioning/Environment              Frequency           Progress Toward Goals  OT Goals(current goals can now be found in the care plan section)  Progress towards OT goals: Progressing toward goals     Plan      Co-evaluation                 End of Session     Activity Tolerance Patient tolerated treatment well   Patient Left in bed;with call bell/phone within reach   Nurse Communication          Time: [id number] OT Time Calculation (min): 20 min  Charges: OT General Charges $OT Visit: 1 Procedure OT Treatments $Self Care/Home Management : 8-22 mins  Kama Cammarano 06/12/2016, 9:33 AM  Simone Curia  Frederico Hamman, [place] 06/12/2016

## 2016-06-12 NOTE — Care Management Note (Signed)
Case Management Note  Patient Details  Name: Dawn West MRN: [id number] Date of Birth: 1951/03/11  Subjective/Objective:    S/p L TKA                Action/Plan: Discharge Planning: AVS reviewed: NCM spoke to pt and states she was arranged for outpt PT at [facility]. Her first session is on Monday. Pt states she has RW, cane and bedside commode at home. Husband at home full-time to assist with care. Dtr coming to stay with her to assist.   PCP Judge Stall   Expected Discharge Date:  06/12/16               Expected Discharge Plan:  Home/Self Care  In-House Referral:  NA  Discharge planning Services  CM Consult  Post Acute Care Choice:  NA Choice offered to:  NA  DME Arranged:  N/A DME Agency:  NA  HH Arranged:  NA HH Agency:  NA  Status of Service:  Completed, signed off  If discussed at [place] of Stay Meetings, dates discussed:    Additional Comments:  Erenest Rasher, RN 06/12/2016, 10:19 AM

## 2016-06-12 NOTE — Evaluation (Signed)
Physical Therapy Evaluation Patient Details Name: Dawn West MRN: [id number] DOB: 1950-05-27 Today's Date: 06/12/2016   History of Present Illness  s/p L TKA, recent breast CA sx  Clinical Impression  Pt progressing with mobility but ltd this pm by N&V.  Reviewed stairs, home therex, and use of KI for sleeping on side with pt and spouse.    Follow Up Recommendations Outpatient PT    Equipment Recommendations  None recommended by PT    Recommendations for Other Services OT consult     Precautions / Restrictions Precautions Precautions: Fall;Knee Restrictions Weight Bearing Restrictions: No Other Position/Activity Restrictions: WBAt      Mobility  Bed Mobility Overal bed mobility: Needs Assistance Bed Mobility: Supine to Sit;Sit to Supine     Supine to sit: Supervision Sit to supine: Supervision   General bed mobility comments: increased time  Transfers Overall transfer level: Needs assistance Equipment used: Rolling walker (2 wheeled) Transfers: Sit to/from Stand Sit to Stand: Supervision         General transfer comment: cues for UE placement  Ambulation/Gait Ambulation/Gait assistance: Min guard;Supervision Ambulation Distance (Feet): 35 Feet Assistive device: Rolling walker (2 wheeled) Gait Pattern/deviations: Decreased step length - right;Decreased step length - left;Step-to pattern;Step-through pattern;Shuffle Gait velocity: decr Gait velocity interpretation: Below normal speed for age/gender    Stairs Stairs: Yes Stairs assistance: Min assist Stair Management: No rails;Step to pattern;Backwards;With walker Number of Stairs: 4 General stair comments: 2 stairs twice with RW bkwd, spouse assisting on second attempt, cues for sequence and foot/RW placement  Wheelchair Mobility    Modified Rankin (Stroke Patients Only)       Balance Overall balance assessment: No apparent balance deficits (not formally assessed)                                            Pertinent Vitals/Pain Pain Assessment: 0-10 Pain Score: 4  Pain Location: L knee Pain Descriptors / Indicators: Aching;Sore Pain Intervention(s): Premedicated before session;Limited activity within patient's tolerance;Monitored during session;Ice applied    Home Living                        Prior Function                 Hand Dominance        Extremity/Trunk Assessment                Communication      Cognition Arousal/Alertness: Awake/alert Behavior During Therapy: WFL for tasks assessed/performed Overall Cognitive Status: Within Functional Limits for tasks assessed                      General Comments      Exercises Total Joint Exercises Ankle Circles/Pumps: AROM;Both;15 reps;Supine Quad Sets: AROM;Supine;5 reps;Both Heel Slides: AAROM;Supine;5 reps;Left Straight Leg Raises: AAROM;AROM;Left;Supine;10 reps   Assessment/Plan    PT Assessment    PT Problem List            PT Treatment Interventions      PT Goals (Current goals can be found in the Care Plan section)  Acute Rehab PT Goals Patient Stated Goal: Regain IND PT Goal Formulation: With patient Time For Goal Achievement: 06/18/16 Potential to Achieve Goals: Good    Frequency 7X/week   Barriers to discharge  Co-evaluation               End of Session Equipment Utilized During Treatment: Gait belt;Left knee immobilizer Activity Tolerance: Patient tolerated treatment well Patient left: in bed;with call bell/phone within reach Nurse Communication: Mobility status         Time: 1331-1420 PT Time Calculation (min) (ACUTE ONLY): 49 min   Charges:     PT Treatments $Gait Training: 8-22 mins $Therapeutic Exercise: 8-22 mins   PT G Codes:        Dawn West 2016-07-09, 4:22 PM

## 2016-06-14 DIAGNOSIS — M1712 Unilateral primary osteoarthritis, left knee: Secondary | ICD-10-CM | POA: Diagnosis not present

## 2016-06-16 ENCOUNTER — Encounter: Payer: Self-pay | Admitting: Radiation Oncology

## 2016-06-16 ENCOUNTER — Ambulatory Visit
Admission: RE | Admit: 2016-06-16 | Discharge: 2016-06-16 | Disposition: A | Discharge status: Home / Self Care | Payer: Medicare Other | Source: Ambulatory Visit | Attending: Radiation Oncology | Admitting: Radiation Oncology

## 2016-06-16 VITALS — BP 110/44 | HR 84 | Temp 98.6°F | Ht 63.0 in | Wt 155.0 lb

## 2016-06-16 DIAGNOSIS — Z880 Allergy status to penicillin: Secondary | ICD-10-CM | POA: Insufficient documentation

## 2016-06-16 DIAGNOSIS — D0511 Intraductal carcinoma in situ of right breast: Secondary | ICD-10-CM

## 2016-06-16 DIAGNOSIS — C50911 Malignant neoplasm of unspecified site of right female breast: Secondary | ICD-10-CM | POA: Diagnosis not present

## 2016-06-16 DIAGNOSIS — Z7901 Long term (current) use of anticoagulants: Secondary | ICD-10-CM | POA: Diagnosis not present

## 2016-06-16 DIAGNOSIS — M1712 Unilateral primary osteoarthritis, left knee: Secondary | ICD-10-CM | POA: Diagnosis not present

## 2016-06-16 DIAGNOSIS — Z888 Allergy status to other drugs, medicaments and biological substances status: Secondary | ICD-10-CM | POA: Diagnosis not present

## 2016-06-16 DIAGNOSIS — Z9889 Other specified postprocedural states: Secondary | ICD-10-CM | POA: Diagnosis not present

## 2016-06-16 DIAGNOSIS — Z171 Estrogen receptor negative status [ER-]: Secondary | ICD-10-CM | POA: Diagnosis not present

## 2016-06-16 NOTE — Progress Notes (Addendum)
Radiation Oncology         [phone] ________________________________  Name: Dawn West MRN: [id number]  Date: 06/16/2016  DOB: [birth year]  Re-Evaluation Visit Note  CC: Judge Stall, FNP  Truitt Merle, MD   Diagnosis: Stage IA (pT1a, pN0) grade 2 invasive ductal carcinoma of the right breast (ER negative, PR negative, HER2 positive)  Interval Since Last Radiation:  N/A  Narrative:  The patient returns today for routine follow-up. The patient was seen in breast clinic on 05/26/16. She underwent a screening mammogram which showed calcifications within the upper  aspect of the right breast. This extended over approximately 2.2 cm and a linear orientation. No mass was appreciated on imaging. Patient proceeded to undergo biopsy of this area on 05/19/16 which revealed high-grade ductal carcinoma in situ (ER/PR negative). The biopsy did show foci highly suspicious for stromal invasion.  The patient underwent a right lumpectomy and SLN biopsy on 06/02/16. This revealed grade 2 IDC spanning 0.3 cm and high grade DCIS with necrosis. The invasive carcinoma came to within <0.1 cm of the posterior margin focally and the DCIS came to within <0.1 cm of the posterior margin focally and the superior margin focally. Excision of the right additional medial margin revealed fibrocystic change and usual ductal hyperplasia with no malignancy identified. Two biopsied right axillary sentinel lymph nodes were negative. The invasive disease was ER negative, PR negative, and HER2 positive.  The patient underwent a left total knee arthroplasty on 06/10/16.  The patient presents today with her husband to discuss radiation for the management of her disease.  ALLERGIES:  is allergic to allegra [fexofenadine hcl]; ciprofloxacin; estradiol; penicillins; and sulfa antibiotics.  Meds: Current Outpatient Prescriptions  Medication Sig Dispense Refill  . apixaban (ELIQUIS) 2.5 MG TABS tablet Take 1 tablet (2.5 mg total) by  mouth every 12 (twelve) hours. 60 tablet 0  . Calcium Carb-Cholecalciferol (CALCIUM 600 + D) 600-200 MG-UNIT TABS Take 1 tablet by mouth 2 (two) times daily.     . cetirizine (ZYRTEC) 10 MG tablet Take 10 mg by mouth daily. At 10 pm daily    . diphenhydrAMINE (EQ NIGHTTIME SLEEP AID) 25 MG tablet Take 25 mg by mouth at bedtime as needed for sleep.    Marland Kitchen docusate sodium (COLACE) 100 MG capsule Take 1 capsule (100 mg total) by mouth 2 (two) times daily. 10 capsule 0  . fluticasone (FLONASE) 50 MCG/ACT nasal spray Place 1 spray into both nostrils 2 (two) times daily as needed for allergies.     . Glucos-Chond-Hyal Ac-Ca Fructo (MOVE FREE JOINT HEALTH ADVANCE PO) Take 1 tablet by mouth daily.    . Magnesium 250 MG TABS Take 250 mg by mouth 3 (three) times daily.     . magnesium hydroxide (MILK OF MAGNESIA) 400 MG/5ML suspension Take by mouth daily as needed for mild constipation.    . Multiple Vitamin (MULTIVITAMIN) tablet Take 1 tablet by mouth daily.    . Omega-3 Fatty Acids (FISH OIL) 1000 MG CAPS Take 1,000 mg by mouth 3 (three) times daily. Takes 1-3 times daily    . oxyCODONE (OXY IR/ROXICODONE) 5 MG immediate release tablet Take 1-2 tablets (5-10 mg total) by mouth every 3 (three) hours as needed for breakthrough pain. 60 tablet 0  . pravastatin (PRAVACHOL) 40 MG tablet Take 1 tablet (40 mg total) by mouth daily. (Patient taking differently: Take 40 mg by mouth daily. At 10 pm) 30 tablet 3  . sertraline (ZOLOFT) 100 MG tablet Take  1 tablet (100 mg total) by mouth daily. (Patient taking differently: daily after supper. Take 1 tablet (100 mg total) by mouth daily. At 6 pm) 90 tablet 3  . sodium chloride (OCEAN) 0.65 % SOLN nasal spray Place 1 spray into both nostrils 2 (two) times daily as needed for congestion.    . Vitamin D, Ergocalciferol, (DRISDOL) 50000 units CAPS capsule Take 1 capsule (50,000 Units total) by mouth every 7 (seven) days. (Patient taking differently: Take 50,000 Units by mouth  every 7 (seven) days. Takes every other week) 30 capsule 2  . vitamin E 400 UNIT capsule Take 400 Units by mouth 3 (three) times daily.     Marland Kitchen zinc gluconate 50 MG tablet Take 50 mg by mouth daily.    . methocarbamol (ROBAXIN) 500 MG tablet Take 1 tablet (500 mg total) by mouth every 6 (six) hours as needed for muscle spasms. (Patient not taking: Reported on 06/16/2016) 20 tablet 0  . ondansetron (ZOFRAN) 4 MG tablet Take 1 tablet (4 mg total) by mouth every 6 (six) hours as needed for nausea. (Patient not taking: Reported on 06/16/2016) 20 tablet 0  . senna (SENOKOT) 8.6 MG TABS tablet Take 2 tablets (17.2 mg total) by mouth at bedtime. (Patient not taking: Reported on 06/16/2016) 120 each 0   No current facility-administered medications for this encounter.     Physical Findings: The patient is in no acute distress. Patient is alert and oriented.  height is _0  (1.6 m) and weight is 155 lb (70.3 kg). Her oral temperature is 98.6 F (37 C). Her blood pressure is 110/44 (abnormal) and her pulse is 84. Her oxygen saturation is 95%.   Lungs are clear to auscultation bilaterally. Heart has regular rate and rhythm. The patient has a well healing scar along the  periareolar area with no sign of drainage or infection. Excellent cosmetic results at this time.  Lab Findings: Lab Results  Component Value Date   WBC 10.7 (H) 06/12/2016   HGB 8.4 (L) 06/12/2016   HCT 25.2 (L) 06/12/2016   MCV 90.3 06/12/2016   PLT 228 06/12/2016    Radiographic Findings: Mm Breast Surgical Specimen  Result Date: 06/02/2016 CLINICAL DATA:  Right breast cancer status post right lumpectomy. EXAM: SPECIMEN RADIOGRAPH OF THE RIGHT BREAST COMPARISON:  Previous exam(s). FINDINGS: Status post excision of the right breast. The radioactive seed and biopsy marker clip are present, completely intact, and were marked for pathology. IMPRESSION: Specimen radiograph of the right breast. Electronically Signed   By: Abelardo Diesel M.D.    On: 06/02/2016 14:20   Dg Knee Left Port  Result Date: 06/10/2016 CLINICAL DATA:  Status post left knee arthroplasty. Initial encounter. EXAM: PORTABLE LEFT KNEE - 1-2 VIEW COMPARISON:  None. FINDINGS: The patient is status post left knee arthroplasty. There is no evidence of loosening or new fracture. An associated drainage catheter is noted. Scattered postoperative soft tissue air is seen. The drainage catheter ends about Hoffa's fat pad. IMPRESSION: Status post left knee arthroplasty. No evidence of loosening or new fracture. Electronically Signed   By: Garald Balding M.D.   On: 06/10/2016 19:37   Mm Digital Diagnostic Unilat R  Result Date: 05/18/2016 CLINICAL DATA:  66 year old female for further evaluation of right breast calcifications identified on screening mammogram. EXAM: DIGITAL DIAGNOSTIC RIGHT MAMMOGRAM WITH CAD COMPARISON:  Previous exam(s). ACR Breast Density Category b: There are scattered areas of fibroglandular density. FINDINGS: Full field and magnification views of the right breast  demonstrate heterogeneous calcifications in a linear orientation within the posterior upper right breast and spanning a distance of 2.2 cm. No associated mass identified. Mammographic images were processed with CAD. IMPRESSION: Suspicious calcifications within the upper right breast. Tissue sampling is recommended. RECOMMENDATION: Stereotactic guided right breast biopsy which will be arranged. I have discussed the findings and recommendations with the patient. Results were also provided in writing at the conclusion of the visit. If applicable, a reminder letter will be sent to the patient regarding the next appointment. BI-RADS CATEGORY  4: Suspicious. Electronically Signed   By: Margarette Canada M.D.   On: 05/18/2016 12:18   Mm Rt Radioactive Seed Loc Mammo Guide  Result Date: 06/01/2016 CLINICAL DATA:  Patient presents for radioactive seed localization of a right breast high grade ductal carcinoma. EXAM:  MAMMOGRAPHIC GUIDED RADIOACTIVE SEED LOCALIZATION OF THE RIGHT BREAST COMPARISON:  Previous exam(s). FINDINGS: Patient presents for radioactive seed localization prior to surgical excision. I met with the patient and we discussed the procedure of seed localization including benefits and alternatives. We discussed the high likelihood of a successful procedure. We discussed the risks of the procedure including infection, bleeding, tissue injury and further surgery. We discussed the low dose of radioactivity involved in the procedure. Informed, written consent was given. The usual time-out protocol was performed immediately prior to the procedure. Using mammographic guidance, sterile technique, 1% lidocaine and an I-125 radioactive seed, the coil shaped biopsy clip was localized using a medial approach. The follow-up mammogram images confirm the seed in the expected location and were marked for Dr. Barry Dienes. Follow-up survey of the patient confirms presence of the radioactive seed. Order number of I-125 seed:  [id number]. Total activity:  8.338 millicuries  Reference Date: 04/16/2016 The patient tolerated the procedure well and was released from the [facility]. She was given instructions regarding seed removal. IMPRESSION: Radioactive seed localization of the right breast. No apparent complications. Electronically Signed   By: Lajean Manes M.D.   On: 06/01/2016 11:24   Mm Clip Placement Right  Result Date: 05/19/2016 CLINICAL DATA:  Post stereotactic core needle biopsy of right breast upper inner quadrant calcifications. EXAM: DIAGNOSTIC RIGHT MAMMOGRAM POST STEREOTACTIC BIOPSY COMPARISON:  Previous exam(s). FINDINGS: Mammographic images were obtained following stereotactic guided biopsy of right breast calcifications. Two-view mammography demonstrates presence of a coil shaped marker within the slightly upper inner quadrant of the right breast, posterior depth. The marker is noted to be 1.8 cm inferior from the  residual calcifications on the ML view. IMPRESSION: Placement of coil shaped marker post stereotactic core needle biopsy of the right breast. The marker is 1.8 cm inferior to the residual calcifications. Final Assessment: Post Procedure Mammograms for Marker Placement Electronically Signed   By: Fidela Salisbury M.D.   On: 05/19/2016 14:44   Mm Rt Breast Bx W Loc Dev 1st Lesion Image Bx Spec Stereo Guide  Addendum Date: 05/20/2016   ADDENDUM REPORT: 05/20/2016 14:27 ADDENDUM: Pathology revealed HIGH GRADE DUCTAL CARCINOMA WITH CALCIFICATIONS of the Right breast, upper inner quadrant. The carcinoma is predominantly in situ and is high grade. There are foci highly suspicious for stromal invasion. This was found to be concordant by Dr. Fidela Salisbury. Pathology results were discussed with the patient by telephone. The patient reported doing well after the biopsy with tenderness at the site. Post biopsy instructions and care were reviewed and questions were answered. The patient was encouraged to call [facility] for any additional concerns. The patient  was referred to [facility] on May 26, 2016. Pathology results reported by Terie Purser, RN on 05/20/2016. Electronically Signed   By: Fidela Salisbury M.D.   On: 05/20/2016 14:27   Result Date: 05/19/2016 CLINICAL DATA:  Right breast slightly upper inner quadrant group of calcifications. EXAM: RIGHT BREAST STEREOTACTIC CORE NEEDLE BIOPSY COMPARISON:  Previous exams. FINDINGS: The patient and I discussed the procedure of stereotactic-guided biopsy including benefits and alternatives. We discussed the high likelihood of a successful procedure. We discussed the risks of the procedure including infection, bleeding, tissue injury, clip migration, and inadequate sampling. Informed written consent was given. The usual time out protocol was performed  immediately prior to the procedure. Using sterile technique and 1% Lidocaine as local anesthetic, under stereotactic guidance, a 9 gauge vacuum assisted device was used to perform core needle biopsy of calcifications in the slightly upper inner quadrant of the right breast, posterior depth using a superior approach. Specimen radiograph was performed showing presence of calcifications. Specimens with calcifications are identified for pathology. At the conclusion of the procedure, a coil shaped tissue marker clip was deployed into the biopsy cavity. Follow-up 2-view mammogram was performed and dictated separately. IMPRESSION: Stereotactic-guided biopsy of right breast calcifications. No apparent complications. Electronically Signed: By: Fidela Salisbury M.D. On: 05/19/2016 14:41    Impression:  Stage IA (pT1a, pN0) grade 2 invasive ductal carcinoma of the right breast (ER negative, PR negative, HER2 positive)  The patient is a good candidate for radiation as part of breast conservation therapy. I spoke to the patient today regarding her diagnosis and options for treatment. We discussed the equivalence in terms of survival and local failure between mastectomy and breast conservation. We discussed the role of radiation in decreasing local failures in patients who undergo lumpectomy. We discussed the process of simulation and the placement tattoos. We discussed 5 1/2 - 6 1/2 weeks of treatment as an outpatient. We discussed the possibility of asymptomatic lung damage. We discussed the low likelihood of secondary malignancies. We discussed the possible side effects including but not limited to skin redness, fatigue, permanent skin darkening, and breast swelling.   Plan: Due to the patient's close DCIS margins, the patient will undergo re-excision of the lumpectomy site. The patient will return approximately 2-3 weeks post-op for re-evaluation; CT simulation ~ 5 wks postop and tx to start ~ 6 wks  postop. ____________________________________ -----------------------------------  Blair Promise, PhD, MD  This document serves as a record of services personally performed by Gery Pray, MD. It was created on his behalf by Darcus Austin, a trained medical scribe. The creation of this record is based on the scribe's personal observations and the provider's statements to them. This document has been checked and approved by the attending provider.

## 2016-06-16 NOTE — Progress Notes (Signed)
Please see the Nurse Progress Note in the MD Initial Consult Encounter for this patient. 

## 2016-06-16 NOTE — Progress Notes (Signed)

## 2016-06-18 DIAGNOSIS — M1712 Unilateral primary osteoarthritis, left knee: Secondary | ICD-10-CM | POA: Diagnosis not present

## 2016-06-21 DIAGNOSIS — M1712 Unilateral primary osteoarthritis, left knee: Secondary | ICD-10-CM | POA: Diagnosis not present

## 2016-06-23 DIAGNOSIS — M1712 Unilateral primary osteoarthritis, left knee: Secondary | ICD-10-CM | POA: Diagnosis not present

## 2016-06-24 DIAGNOSIS — Z96652 Presence of left artificial knee joint: Secondary | ICD-10-CM | POA: Diagnosis not present

## 2016-06-24 DIAGNOSIS — Z471 Aftercare following joint replacement surgery: Secondary | ICD-10-CM | POA: Diagnosis not present

## 2016-06-25 ENCOUNTER — Other Ambulatory Visit: Payer: Self-pay | Admitting: General Surgery

## 2016-06-25 DIAGNOSIS — M1712 Unilateral primary osteoarthritis, left knee: Secondary | ICD-10-CM | POA: Diagnosis not present

## 2016-06-25 MED ORDER — DEXTROSE 5 % IV SOLN: 900.0000 mg | INTRAVENOUS | Status: AC

## 2016-06-25 MED ORDER — GENTAMICIN SULFATE 40 MG/ML IJ SOLN: 5.0000 mg/kg | INTRAVENOUS | Status: AC

## 2016-06-28 DIAGNOSIS — M1712 Unilateral primary osteoarthritis, left knee: Secondary | ICD-10-CM | POA: Diagnosis not present

## 2016-07-01 DIAGNOSIS — M1712 Unilateral primary osteoarthritis, left knee: Secondary | ICD-10-CM | POA: Diagnosis not present

## 2016-07-05 DIAGNOSIS — M1712 Unilateral primary osteoarthritis, left knee: Secondary | ICD-10-CM | POA: Diagnosis not present

## 2016-07-08 DIAGNOSIS — M1712 Unilateral primary osteoarthritis, left knee: Secondary | ICD-10-CM | POA: Diagnosis not present

## 2016-07-09 ENCOUNTER — Encounter: Payer: Self-pay | Admitting: Radiation Oncology

## 2016-07-12 DIAGNOSIS — M1712 Unilateral primary osteoarthritis, left knee: Secondary | ICD-10-CM | POA: Diagnosis not present

## 2016-07-15 DIAGNOSIS — M1712 Unilateral primary osteoarthritis, left knee: Secondary | ICD-10-CM | POA: Diagnosis not present

## 2016-07-20 ENCOUNTER — Encounter (HOSPITAL_COMMUNITY)
Admission: RE | Admit: 2016-07-20 | Discharge: 2016-07-20 | Disposition: A | Discharge status: Home / Self Care | Payer: Medicare Other | Source: Ambulatory Visit | Attending: General Surgery | Admitting: General Surgery

## 2016-07-20 ENCOUNTER — Encounter (HOSPITAL_COMMUNITY): Payer: Self-pay

## 2016-07-20 DIAGNOSIS — Z79899 Other long term (current) drug therapy: Secondary | ICD-10-CM | POA: Insufficient documentation

## 2016-07-20 DIAGNOSIS — Z01818 Encounter for other preprocedural examination: Secondary | ICD-10-CM | POA: Diagnosis not present

## 2016-07-20 DIAGNOSIS — Z7901 Long term (current) use of anticoagulants: Secondary | ICD-10-CM | POA: Insufficient documentation

## 2016-07-20 DIAGNOSIS — C50919 Malignant neoplasm of unspecified site of unspecified female breast: Secondary | ICD-10-CM | POA: Insufficient documentation

## 2016-07-20 HISTORY — DX: Headache, unspecified: R51.9

## 2016-07-20 HISTORY — DX: Headache: R51

## 2016-07-20 LAB — BASIC METABOLIC PANEL
Anion gap: 13 (ref 5–15)
BUN: 14 mg/dL (ref 6–20)
CO2: 26 mmol/L (ref 22–32)
CREATININE: 0.85 mg/dL (ref 0.44–1.00)
Calcium: 9.9 mg/dL (ref 8.9–10.3)
Chloride: 100 mmol/L — ABNORMAL LOW (ref 101–111)
GFR calc Af Amer: >60 mL/min (ref >60)
Glucose, Bld: 103 mg/dL — ABNORMAL HIGH (ref 65–99)
POTASSIUM: 4.2 mmol/L (ref 3.5–5.1)
Sodium: 139 mmol/L (ref 135–145)

## 2016-07-20 LAB — CBC
HCT: 36.8 % (ref 36.0–46.0)
Hemoglobin: 11.9 g/dL — ABNORMAL LOW (ref 12.0–15.0)
MCH: 28.5 pg (ref 26.0–34.0)
MCHC: 32.3 g/dL (ref 30.0–36.0)
MCV: 88.2 fL (ref 78.0–100.0)
PLATELETS: 327 10*3/uL (ref 150–400)
RBC: 4.17 MIL/uL (ref 3.87–5.11)
RDW: 12.5 % (ref 11.5–15.5)
WBC: 5.2 10*3/uL (ref 4.0–10.5)

## 2016-07-20 LAB — PROTIME-INR
INR: 0.98
PROTHROMBIN TIME: 13 s (ref 11.4–15.2)

## 2016-07-20 LAB — GLUCOSE, CAPILLARY: Glucose-Capillary: 135 mg/dL — ABNORMAL HIGH (ref 65–99)

## 2016-07-20 NOTE — Pre-Procedure Instructions (Signed)
Dawn West  07/20/2016      [place], [place] - [address] [place] [address] Phone: [phone] Fax: [phone]  [facility] - [place], [place] [address] Phone: [phone] Fax: [phone]    Your procedure is scheduled on Monday March 19.  Report to [facility] Admitting at 5:30 A.M.  Call this number if you have problems the morning of surgery:  [phone]   Remember:  Do not eat food or drink liquids after midnight.  Take these medicines the morning of surgery with A SIP OF WATER: acetaminophen (tylenol) if needed, hydrocodone (Norco) if needed, methocarbamol (robaxin) if needed  7 days prior to surgery STOP taking any Aspirin, diclofenac (voltaren), Aleve, Naproxen, Ibuprofen, Motrin, Advil, Goody's, BC's, all herbal medications, fish oil, and all vitamins    Do not wear jewelry, make-up or nail polish.  Do not wear lotions, powders, or perfumes, or deoderant.  Do not shave 48 hours prior to surgery.  Men may shave face and neck.  Do not bring valuables to the hospital.  [facility] is not responsible for any belongings or valuables.  Contacts, dentures or bridgework may not be worn into surgery.  Leave your suitcase in the car.  After surgery it may be brought to your room.  For patients admitted to the hospital, discharge time will be determined by your treatment team.  Patients discharged the day of surgery will not be allowed to drive home.    Special instructions:    [place]- Preparing For Surgery  Before surgery, you can play an important role. Because skin is not sterile, your skin needs to be as free of germs as possible. You can reduce the number of germs on your skin by washing with CHG (chlorahexidine gluconate) Soap before surgery.  CHG is an antiseptic cleaner which kills germs and bonds with the skin to continue  killing germs even after washing.  Please do not use if you have an allergy to CHG or antibacterial soaps. If your skin becomes reddened/irritated stop using the CHG.  Do not shave (including legs and underarms) for at least 48 hours prior to first CHG shower. It is OK to shave your face.  Please follow these instructions carefully.   1. Shower the NIGHT BEFORE SURGERY and the MORNING OF SURGERY with CHG.   2. If you chose to wash your hair, wash your hair first as usual with your normal shampoo.  3. After you shampoo, rinse your hair and body thoroughly to remove the shampoo.  4. Use CHG as you would any other liquid soap. You can apply CHG directly to the skin and wash gently with a scrungie or a clean washcloth.   5. Apply the CHG Soap to your body ONLY FROM THE NECK DOWN.  Do not use on open wounds or open sores. Avoid contact with your eyes, ears, mouth and genitals (private parts). Wash genitals (private parts) with your normal soap.  6. Wash thoroughly, paying special attention to the area where your surgery will be performed.  7. Thoroughly rinse your body with warm water from the neck down.  8. DO NOT shower/wash with your normal soap after using and rinsing off the CHG Soap.  9. Pat yourself dry with a CLEAN TOWEL.   10. Wear CLEAN PAJAMAS   11. Place CLEAN SHEETS on your bed the night of  your first shower and DO NOT SLEEP WITH PETS.    Day of Surgery: Do not apply any deodorants/lotions. Please wear clean clothes to the hospital/surgery center.

## 2016-07-21 DIAGNOSIS — M1712 Unilateral primary osteoarthritis, left knee: Secondary | ICD-10-CM | POA: Diagnosis not present

## 2016-07-22 DIAGNOSIS — Z471 Aftercare following joint replacement surgery: Secondary | ICD-10-CM | POA: Diagnosis not present

## 2016-07-22 DIAGNOSIS — Z853 Personal history of malignant neoplasm of breast: Secondary | ICD-10-CM | POA: Diagnosis not present

## 2016-07-22 DIAGNOSIS — Z96652 Presence of left artificial knee joint: Secondary | ICD-10-CM | POA: Diagnosis not present

## 2016-07-23 DIAGNOSIS — M1712 Unilateral primary osteoarthritis, left knee: Secondary | ICD-10-CM | POA: Diagnosis not present

## 2016-07-25 ENCOUNTER — Encounter (HOSPITAL_COMMUNITY): Payer: Self-pay | Admitting: Anesthesiology

## 2016-07-25 NOTE — Anesthesia Preprocedure Evaluation (Addendum)
Anesthesia Evaluation  Patient identified by MRN, date of birth, ID band Patient awake    Reviewed: Allergy & Precautions, NPO status , Patient's Chart, lab work & pertinent test results  History of Anesthesia Complications (+) PONV  Airway Mallampati: I       Dental no notable dental hx.    Pulmonary neg pulmonary ROS,    Pulmonary exam normal        Cardiovascular negative cardio ROS Normal cardiovascular exam     Neuro/Psych    GI/Hepatic negative GI ROS, Neg liver ROS,   Endo/Other  negative endocrine ROS  Renal/GU negative Renal ROS  negative genitourinary   Musculoskeletal   Abdominal Normal abdominal exam  (+)   Peds  Hematology negative hematology ROS (+)   Anesthesia Other Findings   Reproductive/Obstetrics                            Anesthesia Physical Anesthesia Plan  ASA: II  Anesthesia Plan: General   Post-op Pain Management:    Induction: Intravenous  Airway Management Planned: LMA  Additional Equipment:   Intra-op Plan:   Post-operative Plan:   Informed Consent: I have reviewed the patients History and Physical, chart, labs and discussed the procedure including the risks, benefits and alternatives for the proposed anesthesia with the patient or authorized representative who has indicated his/her understanding and acceptance.     Plan Discussed with: Surgeon and CRNA  Anesthesia Plan Comments:        Anesthesia Quick Evaluation

## 2016-07-26 ENCOUNTER — Ambulatory Visit (HOSPITAL_COMMUNITY)
Admission: RE | Admit: 2016-07-26 | Discharge: 2016-07-26 | Disposition: A | Discharge status: Home / Self Care | Payer: Medicare Other | Source: Ambulatory Visit | Attending: General Surgery | Admitting: General Surgery

## 2016-07-26 ENCOUNTER — Ambulatory Visit (HOSPITAL_COMMUNITY): Payer: Medicare Other | Admitting: Anesthesiology

## 2016-07-26 ENCOUNTER — Encounter (HOSPITAL_COMMUNITY): Payer: Self-pay | Admitting: Urology

## 2016-07-26 ENCOUNTER — Encounter (HOSPITAL_COMMUNITY)
Admission: RE | Disposition: A | Discharge status: Home / Self Care | Payer: Self-pay | Source: Ambulatory Visit | Attending: General Surgery

## 2016-07-26 DIAGNOSIS — M1712 Unilateral primary osteoarthritis, left knee: Secondary | ICD-10-CM | POA: Diagnosis not present

## 2016-07-26 DIAGNOSIS — Z96652 Presence of left artificial knee joint: Secondary | ICD-10-CM | POA: Diagnosis not present

## 2016-07-26 DIAGNOSIS — Z79899 Other long term (current) drug therapy: Secondary | ICD-10-CM | POA: Insufficient documentation

## 2016-07-26 DIAGNOSIS — E7800 Pure hypercholesterolemia, unspecified: Secondary | ICD-10-CM | POA: Insufficient documentation

## 2016-07-26 DIAGNOSIS — Z85828 Personal history of other malignant neoplasm of skin: Secondary | ICD-10-CM | POA: Insufficient documentation

## 2016-07-26 DIAGNOSIS — C50211 Malignant neoplasm of upper-inner quadrant of right female breast: Secondary | ICD-10-CM | POA: Diagnosis not present

## 2016-07-26 DIAGNOSIS — F419 Anxiety disorder, unspecified: Secondary | ICD-10-CM | POA: Insufficient documentation

## 2016-07-26 DIAGNOSIS — Z853 Personal history of malignant neoplasm of breast: Secondary | ICD-10-CM | POA: Diagnosis not present

## 2016-07-26 DIAGNOSIS — D0511 Intraductal carcinoma in situ of right breast: Secondary | ICD-10-CM | POA: Diagnosis not present

## 2016-07-26 DIAGNOSIS — F329 Major depressive disorder, single episode, unspecified: Secondary | ICD-10-CM | POA: Diagnosis not present

## 2016-07-26 DIAGNOSIS — Z880 Allergy status to penicillin: Secondary | ICD-10-CM | POA: Insufficient documentation

## 2016-07-26 DIAGNOSIS — C50911 Malignant neoplasm of unspecified site of right female breast: Secondary | ICD-10-CM | POA: Diagnosis present

## 2016-07-26 HISTORY — PX: RE-EXCISION OF BREAST LUMPECTOMY: SHX6048

## 2016-07-26 SURGERY — EXCISION, LESION, BREAST
Anesthesia: General | Site: Breast | Laterality: Right

## 2016-07-26 MED ORDER — BUPIVACAINE HCL (PF) 0.25 % IJ SOLN
INTRAMUSCULAR | Status: AC
  Filled 2016-07-26: qty 30

## 2016-07-26 MED ORDER — BUPIVACAINE HCL (PF) 0.25 % IJ SOLN
INTRAMUSCULAR | Status: DC | PRN
  Administered 2016-07-26: 30 mL

## 2016-07-26 MED ORDER — ACETAMINOPHEN 325 MG PO TABS
ORAL_TABLET | ORAL | Status: AC
  Filled 2016-07-26: qty 2

## 2016-07-26 MED ORDER — LIDOCAINE HCL (CARDIAC) 20 MG/ML IV SOLN
INTRAVENOUS | Status: DC | PRN
  Administered 2016-07-26: 60 mg via INTRAVENOUS

## 2016-07-26 MED ORDER — CHLORHEXIDINE GLUCONATE CLOTH 2 % EX PADS: 6.0000 | MEDICATED_PAD | Freq: Once | CUTANEOUS | Status: DC

## 2016-07-26 MED ORDER — LIDOCAINE 2% (20 MG/ML) 5 ML SYRINGE
INTRAMUSCULAR | Status: AC
  Filled 2016-07-26: qty 5

## 2016-07-26 MED ORDER — MIDAZOLAM HCL 2 MG/2ML IJ SOLN
INTRAMUSCULAR | Status: AC
  Filled 2016-07-26: qty 2

## 2016-07-26 MED ORDER — ACETAMINOPHEN 160 MG/5ML PO SOLN
325.0000 mg | ORAL | Status: DC | PRN
  Filled 2016-07-26: qty 20.3

## 2016-07-26 MED ORDER — SCOPOLAMINE 1 MG/3DAYS TD PT72
MEDICATED_PATCH | TRANSDERMAL | Status: DC | PRN
  Administered 2016-07-26: 1 via TRANSDERMAL

## 2016-07-26 MED ORDER — ONDANSETRON HCL 4 MG/2ML IJ SOLN: 4.0000 mg | Freq: Once | INTRAMUSCULAR | Status: DC | PRN

## 2016-07-26 MED ORDER — EPHEDRINE 5 MG/ML INJ
INTRAVENOUS | Status: AC
  Filled 2016-07-26: qty 10

## 2016-07-26 MED ORDER — OXYCODONE HCL 5 MG PO TABS: 5.0000 mg | ORAL_TABLET | Freq: Once | ORAL | Status: DC | PRN

## 2016-07-26 MED ORDER — SODIUM CHLORIDE 0.9 % IV SOLN: 250.0000 mL | INTRAVENOUS | Status: DC | PRN

## 2016-07-26 MED ORDER — FENTANYL CITRATE (PF) 100 MCG/2ML IJ SOLN
INTRAMUSCULAR | Status: DC | PRN
  Administered 2016-07-26: 100 ug via INTRAVENOUS

## 2016-07-26 MED ORDER — GLYCOPYRROLATE 0.2 MG/ML IJ SOLN
INTRAMUSCULAR | Status: DC | PRN
  Administered 2016-07-26: 0.2 mg via INTRAVENOUS

## 2016-07-26 MED ORDER — ACETAMINOPHEN 325 MG PO TABS
650.0000 mg | ORAL_TABLET | ORAL | Status: DC | PRN
  Administered 2016-07-26: 650 mg via ORAL

## 2016-07-26 MED ORDER — OXYCODONE HCL 5 MG PO TABS
ORAL_TABLET | ORAL | Status: AC
  Filled 2016-07-26: qty 2

## 2016-07-26 MED ORDER — MEPERIDINE HCL 25 MG/ML IJ SOLN: 6.2500 mg | INTRAMUSCULAR | Status: DC | PRN

## 2016-07-26 MED ORDER — OXYCODONE HCL 5 MG PO TABS: 5.0000 mg | ORAL_TABLET | ORAL | 0 refills | Status: DC | PRN

## 2016-07-26 MED ORDER — ONDANSETRON HCL 4 MG/2ML IJ SOLN
INTRAMUSCULAR | Status: AC
  Filled 2016-07-26: qty 2

## 2016-07-26 MED ORDER — PROPOFOL 10 MG/ML IV BOLUS
INTRAVENOUS | Status: DC | PRN
  Administered 2016-07-26: 150 mg via INTRAVENOUS

## 2016-07-26 MED ORDER — CLINDAMYCIN PHOSPHATE 600 MG/50ML IV SOLN
600.0000 mg | INTRAVENOUS | Status: AC
  Administered 2016-07-26: 600 mg via INTRAVENOUS
  Filled 2016-07-26: qty 50

## 2016-07-26 MED ORDER — SODIUM CHLORIDE 0.9% FLUSH: 3.0000 mL | INTRAVENOUS | Status: DC | PRN

## 2016-07-26 MED ORDER — PROPOFOL 10 MG/ML IV BOLUS
INTRAVENOUS | Status: AC
Start: 2016-07-26 — End: 2016-07-26
  Filled 2016-07-26: qty 20

## 2016-07-26 MED ORDER — CLINDAMYCIN PHOSPHATE 600 MG/50ML IV SOLN
INTRAVENOUS | Status: AC
  Filled 2016-07-26: qty 50

## 2016-07-26 MED ORDER — ACETAMINOPHEN 325 MG PO TABS: 325.0000 mg | ORAL_TABLET | ORAL | Status: DC | PRN

## 2016-07-26 MED ORDER — SODIUM CHLORIDE 0.9% FLUSH: 3.0000 mL | Freq: Two times a day (BID) | INTRAVENOUS | Status: DC

## 2016-07-26 MED ORDER — 0.9 % SODIUM CHLORIDE (POUR BTL) OPTIME
TOPICAL | Status: DC | PRN
  Administered 2016-07-26: 1000 mL

## 2016-07-26 MED ORDER — OXYCODONE HCL 5 MG/5ML PO SOLN: 5.0000 mg | Freq: Once | ORAL | Status: DC | PRN

## 2016-07-26 MED ORDER — FENTANYL CITRATE (PF) 100 MCG/2ML IJ SOLN
INTRAMUSCULAR | Status: AC
  Filled 2016-07-26: qty 2

## 2016-07-26 MED ORDER — FENTANYL CITRATE (PF) 100 MCG/2ML IJ SOLN
INTRAMUSCULAR | Status: AC
  Administered 2016-07-26: 50 ug via INTRAVENOUS
  Filled 2016-07-26: qty 2

## 2016-07-26 MED ORDER — LIDOCAINE HCL (PF) 1 % IJ SOLN
INTRAMUSCULAR | Status: AC
  Filled 2016-07-26: qty 30

## 2016-07-26 MED ORDER — DEXAMETHASONE SODIUM PHOSPHATE 10 MG/ML IJ SOLN
INTRAMUSCULAR | Status: AC
  Filled 2016-07-26: qty 1

## 2016-07-26 MED ORDER — EPHEDRINE SULFATE 50 MG/ML IJ SOLN
INTRAMUSCULAR | Status: DC | PRN
  Administered 2016-07-26: 10 mg via INTRAVENOUS

## 2016-07-26 MED ORDER — FENTANYL CITRATE (PF) 100 MCG/2ML IJ SOLN
25.0000 ug | INTRAMUSCULAR | Status: DC | PRN
  Administered 2016-07-26 (×2): 50 ug via INTRAVENOUS

## 2016-07-26 MED ORDER — PHENYLEPHRINE 40 MCG/ML (10ML) SYRINGE FOR IV PUSH (FOR BLOOD PRESSURE SUPPORT)
PREFILLED_SYRINGE | INTRAVENOUS | Status: AC
  Filled 2016-07-26: qty 10

## 2016-07-26 MED ORDER — PHENYLEPHRINE HCL 10 MG/ML IJ SOLN
INTRAMUSCULAR | Status: DC | PRN
  Administered 2016-07-26 (×4): 80 ug via INTRAVENOUS

## 2016-07-26 MED ORDER — LACTATED RINGERS IV SOLN
INTRAVENOUS | Status: DC | PRN
  Administered 2016-07-26: 07:00:00 via INTRAVENOUS

## 2016-07-26 MED ORDER — KETOROLAC TROMETHAMINE 30 MG/ML IJ SOLN
30.0000 mg | Freq: Once | INTRAMUSCULAR | Status: AC
  Administered 2016-07-26: 30 mg via INTRAVENOUS

## 2016-07-26 MED ORDER — OXYCODONE HCL 5 MG PO TABS
5.0000 mg | ORAL_TABLET | ORAL | Status: DC | PRN
Start: 2016-07-26 — End: 2016-07-26
  Administered 2016-07-26: 10 mg via ORAL

## 2016-07-26 MED ORDER — ONDANSETRON HCL 4 MG/2ML IJ SOLN
INTRAMUSCULAR | Status: DC | PRN
  Administered 2016-07-26: 4 mg via INTRAVENOUS

## 2016-07-26 MED ORDER — KETOROLAC TROMETHAMINE 30 MG/ML IJ SOLN
INTRAMUSCULAR | Status: AC
  Administered 2016-07-26: 30 mg via INTRAVENOUS
  Filled 2016-07-26: qty 1

## 2016-07-26 MED ORDER — ACETAMINOPHEN 650 MG RE SUPP: 650.0000 mg | RECTAL | Status: DC | PRN

## 2016-07-26 MED ORDER — MIDAZOLAM HCL 5 MG/5ML IJ SOLN
INTRAMUSCULAR | Status: DC | PRN
  Administered 2016-07-26: 2 mg via INTRAVENOUS

## 2016-07-26 MED ORDER — LIDOCAINE HCL (PF) 1 % IJ SOLN
INTRAMUSCULAR | Status: DC | PRN
  Administered 2016-07-26: 30 mL

## 2016-07-26 SURGICAL SUPPLY — 50 items
ADH SKN CLS APL DERMABOND .7 (GAUZE/BANDAGES/DRESSINGS) ×1
BINDER BREAST LRG (GAUZE/BANDAGES/DRESSINGS) IMPLANT
BINDER BREAST XLRG (GAUZE/BANDAGES/DRESSINGS) IMPLANT
BLADE SURG 10 STRL SS (BLADE) ×2 IMPLANT
CANISTER SUCT 3000ML PPV (MISCELLANEOUS) ×2 IMPLANT
CHLORAPREP W/TINT 26ML (MISCELLANEOUS) ×2 IMPLANT
CLIP TI LARGE 6 (CLIP) ×1 IMPLANT
CONT SPEC 4OZ CLIKSEAL STRL BL (MISCELLANEOUS) ×2 IMPLANT
COVER SURGICAL LIGHT HANDLE (MISCELLANEOUS) ×2 IMPLANT
DERMABOND ADVANCED (GAUZE/BANDAGES/DRESSINGS) ×1
DERMABOND ADVANCED .7 DNX12 (GAUZE/BANDAGES/DRESSINGS) ×1 IMPLANT
DRAPE CHEST BREAST 15X10 FENES (DRAPES) ×2 IMPLANT
DRAPE UTILITY XL STRL (DRAPES) ×2 IMPLANT
DRSG PAD ABDOMINAL 8X10 ST (GAUZE/BANDAGES/DRESSINGS) ×2 IMPLANT
ELECT CAUTERY BLADE 6.4 (BLADE) ×2 IMPLANT
ELECT REM PT RETURN 9FT ADLT (ELECTROSURGICAL) ×2
ELECTRODE REM PT RTRN 9FT ADLT (ELECTROSURGICAL) ×1 IMPLANT
GAUZE SPONGE 4X4 12PLY STRL (GAUZE/BANDAGES/DRESSINGS) ×1 IMPLANT
GLOVE BIO SURGEON STRL SZ 6 (GLOVE) ×2 IMPLANT
GLOVE BIOGEL PI IND STRL 6.5 (GLOVE) ×1 IMPLANT
GLOVE BIOGEL PI INDICATOR 6.5 (GLOVE) ×3
GLOVE ECLIPSE 7.5 STRL STRAW (GLOVE) ×1 IMPLANT
GLOVE INDICATOR 7.5 STRL GRN (GLOVE) ×1 IMPLANT
GLOVE SURG SS PI 6.0 STRL IVOR (GLOVE) ×2 IMPLANT
GOWN STRL REUS W/ TWL LRG LVL3 (GOWN DISPOSABLE) ×1 IMPLANT
GOWN STRL REUS W/TWL 2XL LVL3 (GOWN DISPOSABLE) ×2 IMPLANT
GOWN STRL REUS W/TWL LRG LVL3 (GOWN DISPOSABLE) ×6
ILLUMINATOR WAVEGUIDE N/F (MISCELLANEOUS) IMPLANT
KIT BASIN OR (CUSTOM PROCEDURE TRAY) ×2 IMPLANT
KIT MARKER MARGIN INK (KITS) ×1 IMPLANT
KIT ROOM TURNOVER OR (KITS) ×2 IMPLANT
LIGHT WAVEGUIDE WIDE FLAT (MISCELLANEOUS) ×1 IMPLANT
NDL HYPO 25GX1X1/2 BEV (NEEDLE) ×1 IMPLANT
NEEDLE HYPO 25GX1X1/2 BEV (NEEDLE) ×2 IMPLANT
NS IRRIG 1000ML POUR BTL (IV SOLUTION) ×2 IMPLANT
PACK SURGICAL SETUP 50X90 (CUSTOM PROCEDURE TRAY) ×2 IMPLANT
PAD ARMBOARD 7.5X6 YLW CONV (MISCELLANEOUS) ×2 IMPLANT
PENCIL BUTTON HOLSTER BLD 10FT (ELECTRODE) ×2 IMPLANT
SPONGE LAP 18X18 X RAY DECT (DISPOSABLE) ×2 IMPLANT
STRIP CLOSURE SKIN 1/2X4 (GAUZE/BANDAGES/DRESSINGS) ×1 IMPLANT
SUT MNCRL AB 4-0 PS2 18 (SUTURE) ×2 IMPLANT
SUT SILK 2 0 FS (SUTURE) IMPLANT
SUT VIC AB 3-0 SH 27 (SUTURE) ×2
SUT VIC AB 3-0 SH 27X BRD (SUTURE) ×1 IMPLANT
SYR BULB 3OZ (MISCELLANEOUS) ×2 IMPLANT
SYR CONTROL 10ML LL (SYRINGE) ×2 IMPLANT
TOWEL OR 17X24 6PK STRL BLUE (TOWEL DISPOSABLE) ×2 IMPLANT
TOWEL OR 17X26 10 PK STRL BLUE (TOWEL DISPOSABLE) ×2 IMPLANT
TUBE CONNECTING 12X1/4 (SUCTIONS) ×2 IMPLANT
YANKAUER SUCT BULB TIP NO VENT (SUCTIONS) ×2 IMPLANT

## 2016-07-26 NOTE — Transfer of Care (Signed)
Immediate Anesthesia Transfer of Care Note  Patient: Dawn West  Procedure(s) Performed: Procedure(s): RE-EXCISION OF RIGHT BREAST LUMPECTOMY (Right)  Patient Location: PACU  Anesthesia Type:General  Level of Consciousness: awake, alert , oriented and patient cooperative  Airway & Oxygen Therapy: Patient Spontanous Breathing and Patient connected to nasal cannula oxygen  Post-op Assessment: Report given to RN and Post -op Vital signs reviewed and stable  Post vital signs: Reviewed and stable  Last Vitals:  Vitals:   07/26/16 0613 07/26/16 0905  BP: 129/61   Pulse: 69   Resp: 20   Temp: 36.9 C 36.3 C    Last Pain:  Vitals:   07/26/16 0620  TempSrc:   PainSc: 0-No pain         Complications: No apparent anesthesia complications

## 2016-07-26 NOTE — Anesthesia Procedure Notes (Signed)
Procedure Name: LMA Insertion Date/Time: 07/26/2016 8:09 AM Performed by: Ollen Bowl Pre-anesthesia Checklist: Patient identified, Emergency Drugs available, Suction available, Patient being monitored and Timeout performed Patient Re-evaluated:Patient Re-evaluated prior to inductionOxygen Delivery Method: Circle system utilized and Simple face mask Preoxygenation: Pre-oxygenation with 100% oxygen Intubation Type: IV induction Ventilation: Mask ventilation without difficulty LMA: LMA inserted LMA Size: 4.0 Number of attempts: 1 Airway Equipment and Method: Patient positioned with wedge pillow Placement Confirmation: positive ETCO2 and breath sounds checked- equal and bilateral Tube secured with: Tape Dental Injury: Teeth and Oropharynx as per pre-operative assessment

## 2016-07-26 NOTE — Anesthesia Postprocedure Evaluation (Addendum)
Anesthesia Post Note  Patient: Dawn West  Procedure(s) Performed: Procedure(s) (LRB): RE-EXCISION OF RIGHT BREAST LUMPECTOMY (Right)  Patient location during evaluation: PACU Anesthesia Type: General Level of consciousness: awake and sedated Pain management: pain level controlled Vital Signs Assessment: post-procedure vital signs reviewed and stable Respiratory status: spontaneous breathing Cardiovascular status: stable Postop Assessment: no signs of nausea or vomiting Anesthetic complications: no        Last Vitals:  Vitals:   07/26/16 0905 07/26/16 0915  BP: 129/71 127/65  Pulse:  84  Resp: 14 11  Temp: 36.3 C     Last Pain:  Vitals:   07/26/16 0915  TempSrc:   PainSc: 6    Pain Goal:                 Asti Mackley JR,JOHN Mariel Lukins

## 2016-07-26 NOTE — Discharge Instructions (Addendum)
[facility] °Office Phone Number [phone] ° °BREAST BIOPSY/ PARTIAL MASTECTOMY: POST OP INSTRUCTIONS ° °Always review your discharge instruction sheet given to you by the facility where your surgery was performed. ° °IF YOU HAVE DISABILITY OR FAMILY LEAVE FORMS, YOU MUST BRING THEM TO THE OFFICE FOR PROCESSING.  DO NOT GIVE THEM TO YOUR DOCTOR. ° °1. A prescription for pain medication may be given to you upon discharge.  Take your pain medication as prescribed, if needed.  If narcotic pain medicine is not needed, then you may take acetaminophen (Tylenol) or ibuprofen (Advil) as needed. °2. Take your usually prescribed medications unless otherwise directed °3. If you need a refill on your pain medication, please contact your pharmacy.  They will contact our office to request authorization.  Prescriptions will not be filled after 5pm or on week-ends. °4. You should eat very light the first 24 hours after surgery, such as soup, crackers, pudding, etc.  Resume your normal diet the day after surgery. °5. Most patients will experience some swelling and bruising in the breast.  Ice packs and a good support bra will help.  Swelling and bruising can take several days to resolve.  °6. It is common to experience some constipation if taking pain medication after surgery.  Increasing fluid intake and taking a stool softener will usually help or prevent this problem from occurring.  A mild laxative (Milk of Magnesia or Miralax) should be taken according to package directions if there are no bowel movements after 48 hours. °7. Unless discharge instructions indicate otherwise, you may remove your bandages 48 hours after surgery, and you may shower at that time.  You may have steri-strips (small skin tapes) in place directly over the incision.  These strips should be left on the skin for 7-10 days.   Any sutures or staples will be removed at the office during your follow-up visit. °8. ACTIVITIES:  You may resume  regular daily activities (gradually increasing) beginning the next day.  Wearing a good support bra or sports bra (or the breast binder) minimizes pain and swelling.  You may have sexual intercourse when it is comfortable. °a. You may drive when you no longer are taking prescription pain medication, you can comfortably wear a seatbelt, and you can safely maneuver your car and apply brakes. °b. RETURN TO WORK:  __________1 week_______________ °9. You should see your doctor in the office for a follow-up appointment approximately two weeks after your surgery.  Your doctor’s nurse will typically make your follow-up appointment when she calls you with your pathology report.  Expect your pathology report 2-3 business days after your surgery.  You may call to check if you do not hear from us after three days. ° ° °WHEN TO CALL YOUR DOCTOR: °1. Fever over 101.0 °2. Nausea and/or vomiting. °3. Extreme swelling or bruising. °4. Continued bleeding from incision. °5. Increased pain, redness, or drainage from the incision. ° °The clinic staff is available to answer your questions during regular business hours.  Please don’t hesitate to call and ask to speak to one of the nurses for clinical concerns.  If you have a medical emergency, go to the nearest emergency room or call 911.  A surgeon from [facility] is always on call at the hospital. ° °For further questions, please visit [email or web address]  ° °

## 2016-07-26 NOTE — Op Note (Signed)
Re-excisional right Breast Lumpectomy   Indications: This patient presents with history of close margins after partial mastectomy for right breast cancer   Pre-operative Diagnosis: Right breast cancer   Post-operative Diagnosis: Right breast cancer   Surgeon: DXAJOI,NOMVE   Assistants: n/a   Anesthesia: General anesthesia and Local anesthesia 0.5% bupivacaine, with epinephrine   ASA Class: 2   Procedure Details  The patient was seen in the Holding Room. The risks, benefits, complications, treatment options, and expected outcomes were discussed with the patient. The possibilities of reaction to medication, pulmonary aspiration, bleeding, infection, the need for additional procedures, failure to diagnose a condition, and creating a complication requiring transfusion or operation were discussed with the patient. The patient concurred with the proposed plan, giving informed consent. The site of surgery properly noted/marked. The patient was taken to Operating Room # 1, identified, and the procedure verified as re-excision of right breast cancer.  After induction of anesthesia, the  breast and chest were prepped and draped in standard fashion.  The lumpectomy was performed by reopening the prior incision. Seroma was aspirated. The mastopexy sutures were removed. Additional margin were taken at the superior border of the partial mastectomy cavity. Dissection was carried down to the pectoral fascia. Orientation sutures were placed in the specimens. Hemostasis was achieved with cautery. The wound was irrigated and closed with a 3-0 Vicryl deep dermal interrupted and a 4-0 Monocryl subcuticular closure in layers.  Sterile dressings were applied. At the end of the operation, all sponge, instrument, and needle counts were correct.   Findings:  grossly clear surgical margins  Estimated Blood Loss: Minimal   Drains: none   Specimens: additional superior margin  Complications: None; patient  tolerated the procedure well.   Disposition: PACU - hemodynamically stable.   Condition: stable

## 2016-07-26 NOTE — H&P (Signed)
Dawn West is an 66 y.o. female.   Chief Complaint: right breast cancer HPI:  Pt is a 66 yo F with right breast cancer.  She had a close margin.  She is s/p left knee replacement.  She is done with the intensive PT component.  She is ready for reexcision.    Past Medical History:  Diagnosis Date  . Anxiety   . Arthritis    knees  . Cancer ([place]) 12/01/11   basal cell skin ca Left upper arm  . Cancer ([place]) 04/20/2016   right breast cancer  . Complication of anesthesia   . Depression   . Headache   . Hypercholesteremia   . PONV (postoperative nausea and vomiting)     Past Surgical History:  Procedure Laterality Date  . ABDOMINAL HYSTERECTOMY    . BREAST LUMPECTOMY WITH RADIOACTIVE SEED AND SENTINEL LYMPH NODE BIOPSY Right 06/02/2016   Procedure: RIGHT BREAST LUMPECTOMY WITH RADIOACTIVE SEED AND SENTINEL LYMPH NODE BIOPSY;  Surgeon: [name], MD;  Location: [facility];  Service: General;  Laterality: Right;  . BREAST SURGERY  05/19/2016   right breast biopsy  . KNEE ARTHROPLASTY Left 06/10/2016   Procedure: LEFT TOTAL KNEE ARTHROPLASTY WITH COMPUTER NAVIGATION;  Surgeon: Rod Can, MD;  Location: [facility];  Service: Orthopedics;  Laterality: Left;  . MOUTH SURGERY    . SKIN CANCER EXCISION Left 2013   upper arm, basal cell  . TONSILLECTOMY AND ADENOIDECTOMY     (DEVIATED SEPTUM  SAME TIME)  . TOTAL VAGINAL HYSTERECTOMY  12/1996   menorrhagia, fibroids    Family History  Problem Relation Age of Onset  . Hypertension Mother   . Heart failure Mother   . Diabetes Mother   . Stroke Mother   . COPD Mother   . Hyperlipidemia Mother   . Hypertension Father   . Heart failure Father   . Hypertension Brother   . Hypertension Sister   . Hyperlipidemia Sister   . Hypertension Brother   . Hypertension Brother   . Diabetes Brother    Social History:  reports that she has never smoked. She has never used smokeless tobacco. She reports that she drinks alcohol.  She reports that she does not use drugs.  Allergies:  Allergies  Allergen Reactions  . Allegra [Fexofenadine Hcl]     Abdominal pain  . Ciprofloxacin Other (See Comments)    UNKNOWN  . Estradiol Other (See Comments)    Pt is too no longer receive any hormonal therapy due to breast cancer  . Penicillins Rash    Has patient had a PCN reaction causing immediate rash, facial/tongue/throat swelling, SOB or lightheadedness with hypotension: Yes Has patient had a PCN reaction causing severe rash involving mucus membranes or skin necrosis: Yes Has patient had a PCN reaction that required hospitalization No Has patient had a PCN reaction occurring within the last 10 years: No If all of the above answers are "NO", then may proceed with Cephalosporin use.   . Sulfa Antibiotics Rash    Medications Prior to Admission  Medication Sig Dispense Refill  . Calcium Carb-Cholecalciferol (CALCIUM 600 + D) 600-200 MG-UNIT TABS Take 1 tablet by mouth 2 (two) times daily.     . cetirizine (ZYRTEC) 10 MG tablet Take 10 mg by mouth at bedtime. At 10 pm daily    . diphenhydrAMINE (EQ NIGHTTIME SLEEP AID) 25 MG tablet Take 25 mg by mouth at bedtime as needed for sleep.    Marland Kitchen  docusate sodium (COLACE) 100 MG capsule Take 1 capsule (100 mg total) by mouth 2 (two) times daily. (Patient taking differently: Take 100 mg by mouth daily as needed for mild constipation. ) 10 capsule 0  . fluticasone (FLONASE) 50 MCG/ACT nasal spray Place 1 spray into both nostrils daily.     . Glucos-Chond-Hyal Ac-Ca Fructo (MOVE FREE JOINT HEALTH ADVANCE PO) Take 1 tablet by mouth daily.    Marland Kitchen HYDROcodone-acetaminophen (NORCO/VICODIN) 5-325 MG tablet Take 1-2 tablets by mouth every 6 (six) hours as needed for moderate pain (depends on pain level).    . Magnesium 250 MG TABS Take 250 mg by mouth 3 (three) times daily.     . magnesium hydroxide (MILK OF MAGNESIA) 400 MG/5ML suspension Take 5 mLs by mouth daily as needed for mild constipation.      . Multiple Vitamin (MULTIVITAMIN) tablet Take 1 tablet by mouth daily.    . Omega-3 Fatty Acids (FISH OIL) 1000 MG CAPS Take 1,000 mg by mouth 3 (three) times daily. Takes 1-3 times daily    . pravastatin (PRAVACHOL) 40 MG tablet Take 1 tablet (40 mg total) by mouth daily. (Patient taking differently: Take 40 mg by mouth every evening. At 10 pm) 30 tablet 3  . sertraline (ZOLOFT) 100 MG tablet Take 1 tablet (100 mg total) by mouth daily. (Patient taking differently: Take 100 mg by mouth daily after supper. Take 1 tablet (100 mg total) by mouth daily. At 6 pm) 90 tablet 3  . tetrahydrozoline 0.05 % ophthalmic solution Place 1 drop into both eyes daily as needed (dry eyes).    . Vitamin D, Ergocalciferol, (DRISDOL) 50000 units CAPS capsule Take 1 capsule (50,000 Units total) by mouth every 7 (seven) days. (Patient taking differently: Take 50,000 Units by mouth See admin instructions. Takes every other week on Friday) 30 capsule 2  . vitamin E 400 UNIT capsule Take 400 Units by mouth 3 (three) times daily.     Marland Kitchen zinc gluconate 50 MG tablet Take 50 mg by mouth daily.    Marland Kitchen acetaminophen (TYLENOL) 650 MG CR tablet Take 1,300 mg by mouth every 8 (eight) hours as needed for pain.    Marland Kitchen apixaban (ELIQUIS) 2.5 MG TABS tablet Take 1 tablet (2.5 mg total) by mouth every 12 (twelve) hours. (Patient not taking: Reported on 07/15/2016) 60 tablet 0  . diclofenac (VOLTAREN) 75 MG EC tablet Take 75 mg by mouth 2 (two) times daily.    . methocarbamol (ROBAXIN) 500 MG tablet Take 1 tablet (500 mg total) by mouth every 6 (six) hours as needed for muscle spasms. (Patient not taking: Reported on 06/16/2016) 20 tablet 0  . ondansetron (ZOFRAN) 4 MG tablet Take 1 tablet (4 mg total) by mouth every 6 (six) hours as needed for nausea. (Patient not taking: Reported on 06/16/2016) 20 tablet 0  . oxyCODONE (OXY IR/ROXICODONE) 5 MG immediate release tablet Take 1-2 tablets (5-10 mg total) by mouth every 3 (three) hours as needed for  breakthrough pain. (Patient not taking: Reported on 07/15/2016) 60 tablet 0  . senna (SENOKOT) 8.6 MG TABS tablet Take 2 tablets (17.2 mg total) by mouth at bedtime. (Patient not taking: Reported on 06/16/2016) 120 each 0  . sodium chloride (OCEAN) 0.65 % SOLN nasal spray Place 1 spray into both nostrils 2 (two) times daily as needed for congestion.      No results found for this or any previous visit (from the past 48 hour(s)). No results found.  Review  of Systems  All other systems reviewed and are negative.   Blood pressure 129/61, pulse 69, temperature 98.5 F (36.9 C), temperature source Oral, resp. rate 20, last menstrual period 12/12/1996, SpO2 97 %. Physical Exam  Constitutional: She is oriented to person, place, and time. She appears well-developed and well-nourished. No distress.  HENT:  Head: Normocephalic and atraumatic.  Eyes: Conjunctivae are normal. Pupils are equal, round, and reactive to light. No scleral icterus.  Neck: Normal range of motion. Neck supple. No thyromegaly present.  Cardiovascular: Normal rate.   Respiratory: Effort normal. No respiratory distress.  GI: Soft.  Neurological: She is alert and oriented to person, place, and time.  Skin: Skin is warm and dry. No rash noted. She is not diaphoretic. No erythema. No pallor.  Psychiatric: She has a normal mood and affect. Her behavior is normal. Judgment and thought content normal.     Assessment/Plan Right breast cancer  Plan reexcision for close margins.  Reviewed with patient and husband.   [name], MD 07/26/2016, 7:50 AM

## 2016-07-27 ENCOUNTER — Encounter (HOSPITAL_COMMUNITY): Payer: Self-pay | Admitting: General Surgery

## 2016-08-02 ENCOUNTER — Telehealth: Payer: Self-pay | Admitting: General Surgery

## 2016-08-02 NOTE — Telephone Encounter (Signed)
Discussed close margin for DCIS with patient.    I am going to order MRI since we did not see this level of DCIS on mammography.  Will need reexcision.

## 2016-08-03 ENCOUNTER — Other Ambulatory Visit: Payer: Self-pay | Admitting: General Surgery

## 2016-08-03 DIAGNOSIS — Z171 Estrogen receptor negative status [ER-]: Secondary | ICD-10-CM

## 2016-08-03 DIAGNOSIS — C50211 Malignant neoplasm of upper-inner quadrant of right female breast: Secondary | ICD-10-CM

## 2016-08-09 NOTE — Progress Notes (Signed)
cation of Breast Cancer: upper inner quadrant of the right breast   Histology per Pathology Report:   07/26/16 Diagnosis Breast, excision, Right new Superior Margin - DUCTAL CARCINOMA IN SITU WITH CALCIFICATIONS, HIGH GRADE, SPANNING AT LEAST 1.5 CM. - FOCI HIGHLY SUSPICIOUS FOR EARLY STROMAL INVASION. - DUCTAL CARCINOMA IN SITU IS BROADLY LESS THAN 0.1 CM TO THE NEW SUPERIOR MARGIN.  06/02/16 Diagnosis 1. Breast, lumpectomy, Right w/seed - INVASIVE DUCTAL CARCINOMA, GRADE 2, SPANNING 0.3 CM. - HIGH GRADE DUCTAL CARCINOMA IN SITU WITH NECROSIS. - INVASIVE CARCINOMA COMES TO WITHIN <0.1 CM OF THE POSTERIOR MARGIN FOCALLY. - DUCTAL CARCINOMA IN SITU COMES TO WITHIN <0.1 CM OF THE POSTERIOR MARGIN FOCALLY AND THE SUPERIOR MARGIN FOCALLY. - BIOPSY SITE. - SEE ONCOLOGY TABLE. 2. Breast, excision, Right additional Medial Margin - FIBROCYSTIC CHANGE. - USUAL DUCTAL HYPERPLASIA. - NO MALIGNANCY IDENTIFIED. 3. Lymph node, sentinel, biopsy, Right Axillary #1 - ONE OF ONE LYMPH NODES NEGATIVE FOR CARCINOMA (0/1). 4. Lymph node, sentinel, biopsy, Right Axillary - ONE OF ONE LYMPH NODES NEGATIVE FOR CARCINOMA (0/1).  05/19/16 Diagnosis Breast, right, needle core biopsy, UIQ - DUCTAL CARCINOMA WITH CALCIFICATIONS. - SEE COMMENT.  Receptor Status: ER(0), PR (0), Her2-neu (positive), Ki-(10%)  Did patient present with symptoms (if so, please note symptoms) or was this found on screening mammography?: screening mammogram    Past/Anticipated interventions by surgeon, if any: 07/26/16 - Procedure: RE-EXCISION OF RIGHT BREAST LUMPECTOMY;  Surgeon: [name], MD, 05/19/16 - Procedure: RIGHT BREAST LUMPECTOMY WITH RADIOACTIVE SEED AND SENTINEL LYMPH NODE BIOPSY;  Surgeon: [name], MD   Past/Anticipated interventions by medical oncology, if any:   Lymphedema issues, if any:  no    Pain issues, if any:   SAFETY ISSUES:  Prior radiation? no  Pacemaker/ICD? no  Possible current  pregnancy?no  Is the patient on methotrexate? no  Current Complaints / other details:

## 2016-08-11 ENCOUNTER — Ambulatory Visit
Admission: RE | Admit: 2016-08-11 | Discharge: 2016-08-11 | Disposition: A | Discharge status: Home / Self Care | Payer: Medicare Other | Source: Ambulatory Visit | Attending: General Surgery | Admitting: General Surgery

## 2016-08-11 DIAGNOSIS — Z171 Estrogen receptor negative status [ER-]: Secondary | ICD-10-CM

## 2016-08-11 DIAGNOSIS — C50211 Malignant neoplasm of upper-inner quadrant of right female breast: Secondary | ICD-10-CM

## 2016-08-11 DIAGNOSIS — D0511 Intraductal carcinoma in situ of right breast: Secondary | ICD-10-CM | POA: Diagnosis not present

## 2016-08-11 MED ORDER — GADOBENATE DIMEGLUMINE 529 MG/ML IV SOLN
15.0000 mL | Freq: Once | INTRAVENOUS | Status: AC | PRN
  Administered 2016-08-11: 13 mL via INTRAVENOUS

## 2016-08-13 NOTE — Progress Notes (Signed)
Location of Breast Cancer: upper inner quadrant of the right breast   Histology per Pathology Report:   07/26/16 Diagnosis Breast, excision, Right new Superior Margin - DUCTAL CARCINOMA IN SITU WITH CALCIFICATIONS, HIGH GRADE, SPANNING AT LEAST 1.5 CM. - FOCI HIGHLY SUSPICIOUS FOR EARLY STROMAL INVASION. - DUCTAL CARCINOMA IN SITU IS BROADLY LESS THAN 0.1 CM TO THE NEW SUPERIOR MARGIN.  06/02/16 Diagnosis 1. Breast, lumpectomy, Right w/seed - INVASIVE DUCTAL CARCINOMA, GRADE 2, SPANNING 0.3 CM. - HIGH GRADE DUCTAL CARCINOMA IN SITU WITH NECROSIS. - INVASIVE CARCINOMA COMES TO WITHIN <0.1 CM OF THE POSTERIOR MARGIN FOCALLY. - DUCTAL CARCINOMA IN SITU COMES TO WITHIN <0.1 CM OF THE POSTERIOR MARGIN FOCALLY AND THE SUPERIOR MARGIN FOCALLY. - BIOPSY SITE. - SEE ONCOLOGY TABLE. 2. Breast, excision, Right additional Medial Margin - FIBROCYSTIC CHANGE. - USUAL DUCTAL HYPERPLASIA. - NO MALIGNANCY IDENTIFIED. 3. Lymph node, sentinel, biopsy, Right Axillary #1 - ONE OF ONE LYMPH NODES NEGATIVE FOR CARCINOMA (0/1). 4. Lymph node, sentinel, biopsy, Right Axillary - ONE OF ONE LYMPH NODES NEGATIVE FOR CARCINOMA (0/1).  05/19/16 Diagnosis Breast, right, needle core biopsy, UIQ - DUCTAL CARCINOMA WITH CALCIFICATIONS. - SEE COMMENT.  Receptor Status: ER(0), PR (0), Her2-neu (positive), Ki-(10%)  Did patient present with symptoms (if so, please note symptoms) or was this found on screening mammography?: screening mammogram    Past/Anticipated interventions by surgeon, if any: 07/26/16 - Procedure: RE-EXCISION OF RIGHT BREAST LUMPECTOMY;  Surgeon: [name], MD, 05/19/16 - Procedure: RIGHT BREAST LUMPECTOMY WITH RADIOACTIVE SEED AND SENTINEL LYMPH NODE BIOPSY;  Surgeon: [name], MD   Past/Anticipated interventions by medical oncology, if any:   Lymphedema issues, if any:  no    Pain issues, if any:   SAFETY ISSUES:  Prior radiation? no  Pacemaker/ICD? no  Possible current  pregnancy?no  Is the patient on methotrexate? no  Current Complaints / other details:

## 2016-08-16 ENCOUNTER — Other Ambulatory Visit: Payer: Self-pay | Admitting: General Surgery

## 2016-08-16 ENCOUNTER — Ambulatory Visit: Payer: Medicare Other

## 2016-08-16 ENCOUNTER — Ambulatory Visit
Admission: RE | Admit: 2016-08-16 | Discharge: 2016-08-16 | Disposition: A | Discharge status: Home / Self Care | Payer: Medicare Other | Source: Ambulatory Visit | Attending: Radiation Oncology | Admitting: Radiation Oncology

## 2016-08-16 DIAGNOSIS — C50211 Malignant neoplasm of upper-inner quadrant of right female breast: Secondary | ICD-10-CM

## 2016-08-16 DIAGNOSIS — N63 Unspecified lump in unspecified breast: Secondary | ICD-10-CM

## 2016-08-18 ENCOUNTER — Ambulatory Visit
Admission: RE | Admit: 2016-08-18 | Discharge: 2016-08-18 | Disposition: A | Discharge status: Home / Self Care | Payer: Medicare Other | Source: Ambulatory Visit | Attending: Radiation Oncology | Admitting: Radiation Oncology

## 2016-08-26 ENCOUNTER — Ambulatory Visit
Admission: RE | Admit: 2016-08-26 | Discharge: 2016-08-26 | Disposition: A | Discharge status: Home / Self Care | Payer: Medicare Other | Source: Ambulatory Visit | Attending: General Surgery | Admitting: General Surgery

## 2016-08-26 ENCOUNTER — Other Ambulatory Visit: Payer: Self-pay | Admitting: General Surgery

## 2016-08-26 DIAGNOSIS — N6489 Other specified disorders of breast: Secondary | ICD-10-CM | POA: Diagnosis not present

## 2016-08-26 DIAGNOSIS — R928 Other abnormal and inconclusive findings on diagnostic imaging of breast: Secondary | ICD-10-CM | POA: Diagnosis not present

## 2016-08-26 DIAGNOSIS — C50211 Malignant neoplasm of upper-inner quadrant of right female breast: Secondary | ICD-10-CM

## 2016-08-26 DIAGNOSIS — D0511 Intraductal carcinoma in situ of right breast: Secondary | ICD-10-CM | POA: Diagnosis not present

## 2016-08-26 MED ORDER — GADOBENATE DIMEGLUMINE 529 MG/ML IV SOLN
13.0000 mL | Freq: Once | INTRAVENOUS | Status: AC | PRN
  Administered 2016-08-26: 13 mL via INTRAVENOUS

## 2016-08-30 ENCOUNTER — Other Ambulatory Visit: Payer: Self-pay | Admitting: General Surgery

## 2016-08-30 DIAGNOSIS — Z170 Estrogen receptor positive status [ER+]: Secondary | ICD-10-CM

## 2016-08-30 DIAGNOSIS — C50411 Malignant neoplasm of upper-outer quadrant of right female breast: Secondary | ICD-10-CM

## 2016-08-30 MED ORDER — GENTAMICIN SULFATE 40 MG/ML IJ SOLN: 5.0000 mg/kg | INTRAVENOUS | Status: AC

## 2016-08-30 MED ORDER — DEXTROSE 5 % IV SOLN: 900.0000 mg | INTRAVENOUS | Status: AC

## 2016-09-01 ENCOUNTER — Encounter (HOSPITAL_BASED_OUTPATIENT_CLINIC_OR_DEPARTMENT_OTHER): Payer: Self-pay | Admitting: *Deleted

## 2016-09-01 ENCOUNTER — Other Ambulatory Visit: Payer: Self-pay | Admitting: General Surgery

## 2016-09-01 ENCOUNTER — Other Ambulatory Visit: Payer: Self-pay | Admitting: Nurse Practitioner

## 2016-09-01 DIAGNOSIS — Z170 Estrogen receptor positive status [ER+]: Secondary | ICD-10-CM

## 2016-09-01 DIAGNOSIS — C50411 Malignant neoplasm of upper-outer quadrant of right female breast: Secondary | ICD-10-CM

## 2016-09-01 NOTE — Telephone Encounter (Signed)
Medication refill request: Sertraline Last AEX:  09/19/15 PG Next AEX: 09/24/16 PG Last MMG (if hormonal medication request): 08/11/16 BIRADS4, Suspicious; 08/26/16 R Breast Clip Placement-path revealed high grade ductal carcinoma in situ with foci; Has appointments scheduled for 4/26 * 4/30.  Refill authorized: 09/19/15 #[id number]. Please advise. Thank you.

## 2016-09-01 NOTE — Telephone Encounter (Signed)
Medication refill request: Zoloft  Last AEX:  09-24-15  Next AEX: 09-24-16  Last MMG (if hormonal medication request): MRI breast 08-11-16- breast cancer  Refill authorized: please advise

## 2016-09-02 ENCOUNTER — Encounter: Payer: Self-pay | Admitting: Radiation Oncology

## 2016-09-02 ENCOUNTER — Ambulatory Visit
Admission: RE | Admit: 2016-09-02 | Discharge: 2016-09-02 | Disposition: A | Discharge status: Home / Self Care | Payer: Medicare Other | Source: Ambulatory Visit | Attending: General Surgery | Admitting: General Surgery

## 2016-09-02 DIAGNOSIS — Z170 Estrogen receptor positive status [ER+]: Secondary | ICD-10-CM

## 2016-09-02 DIAGNOSIS — C50411 Malignant neoplasm of upper-outer quadrant of right female breast: Secondary | ICD-10-CM | POA: Diagnosis not present

## 2016-09-02 NOTE — Progress Notes (Signed)
Boost drink with instructions to complete by [phone] dos, and hibliclens soap given with instructions/ pt verbalized understanding.

## 2016-09-06 ENCOUNTER — Ambulatory Visit
Admission: RE | Admit: 2016-09-06 | Discharge: 2016-09-06 | Disposition: A | Discharge status: Home / Self Care | Payer: Medicare Other | Source: Ambulatory Visit | Attending: General Surgery | Admitting: General Surgery

## 2016-09-06 ENCOUNTER — Encounter (HOSPITAL_BASED_OUTPATIENT_CLINIC_OR_DEPARTMENT_OTHER): Payer: Self-pay | Admitting: Anesthesiology

## 2016-09-06 ENCOUNTER — Ambulatory Visit (HOSPITAL_BASED_OUTPATIENT_CLINIC_OR_DEPARTMENT_OTHER)
Admission: RE | Admit: 2016-09-06 | Discharge: 2016-09-06 | Disposition: A | Discharge status: Home / Self Care | Payer: Medicare Other | Source: Ambulatory Visit | Attending: General Surgery | Admitting: General Surgery

## 2016-09-06 ENCOUNTER — Encounter (HOSPITAL_BASED_OUTPATIENT_CLINIC_OR_DEPARTMENT_OTHER): Payer: Self-pay | Admitting: Certified Registered Nurse Anesthetist

## 2016-09-06 ENCOUNTER — Ambulatory Visit (HOSPITAL_BASED_OUTPATIENT_CLINIC_OR_DEPARTMENT_OTHER): Payer: Medicare Other | Admitting: Anesthesiology

## 2016-09-06 ENCOUNTER — Encounter (HOSPITAL_BASED_OUTPATIENT_CLINIC_OR_DEPARTMENT_OTHER)
Admission: RE | Disposition: A | Discharge status: Home / Self Care | Payer: Self-pay | Source: Ambulatory Visit | Attending: General Surgery

## 2016-09-06 DIAGNOSIS — C50211 Malignant neoplasm of upper-inner quadrant of right female breast: Secondary | ICD-10-CM | POA: Diagnosis not present

## 2016-09-06 DIAGNOSIS — C50411 Malignant neoplasm of upper-outer quadrant of right female breast: Secondary | ICD-10-CM

## 2016-09-06 DIAGNOSIS — Z170 Estrogen receptor positive status [ER+]: Secondary | ICD-10-CM

## 2016-09-06 DIAGNOSIS — Z171 Estrogen receptor negative status [ER-]: Secondary | ICD-10-CM | POA: Insufficient documentation

## 2016-09-06 DIAGNOSIS — C50911 Malignant neoplasm of unspecified site of right female breast: Secondary | ICD-10-CM | POA: Diagnosis present

## 2016-09-06 DIAGNOSIS — N6011 Diffuse cystic mastopathy of right breast: Secondary | ICD-10-CM | POA: Diagnosis not present

## 2016-09-06 HISTORY — PX: BREAST LUMPECTOMY WITH RADIOACTIVE SEED LOCALIZATION: SHX6424

## 2016-09-06 SURGERY — BREAST LUMPECTOMY WITH RADIOACTIVE SEED LOCALIZATION
Anesthesia: General | Site: Breast | Laterality: Right

## 2016-09-06 MED ORDER — CLINDAMYCIN PHOSPHATE 600 MG/50ML IV SOLN
INTRAVENOUS | Status: AC
  Filled 2016-09-06: qty 50

## 2016-09-06 MED ORDER — PROPOFOL 10 MG/ML IV BOLUS
INTRAVENOUS | Status: DC | PRN
  Administered 2016-09-06: 150 mg via INTRAVENOUS

## 2016-09-06 MED ORDER — PROPOFOL 10 MG/ML IV BOLUS
INTRAVENOUS | Status: AC
  Filled 2016-09-06: qty 20

## 2016-09-06 MED ORDER — FENTANYL CITRATE (PF) 100 MCG/2ML IJ SOLN
INTRAMUSCULAR | Status: AC
  Filled 2016-09-06: qty 2

## 2016-09-06 MED ORDER — ONDANSETRON HCL 4 MG/2ML IJ SOLN
INTRAMUSCULAR | Status: AC
  Filled 2016-09-06: qty 2

## 2016-09-06 MED ORDER — EPHEDRINE SULFATE 50 MG/ML IJ SOLN
INTRAMUSCULAR | Status: DC | PRN
  Administered 2016-09-06: 10 mg via INTRAVENOUS

## 2016-09-06 MED ORDER — CHLORHEXIDINE GLUCONATE CLOTH 2 % EX PADS: 6.0000 | MEDICATED_PAD | Freq: Once | CUTANEOUS | Status: DC

## 2016-09-06 MED ORDER — SODIUM CHLORIDE 0.9% FLUSH: 3.0000 mL | INTRAVENOUS | Status: DC | PRN

## 2016-09-06 MED ORDER — ACETAMINOPHEN 325 MG PO TABS: 650.0000 mg | ORAL_TABLET | ORAL | Status: DC | PRN

## 2016-09-06 MED ORDER — SCOPOLAMINE 1 MG/3DAYS TD PT72
1.0000 | MEDICATED_PATCH | Freq: Once | TRANSDERMAL | Status: DC | PRN
  Administered 2016-09-06: 1.5 mg via TRANSDERMAL

## 2016-09-06 MED ORDER — HYDROCODONE-ACETAMINOPHEN 5-325 MG PO TABS: 1.0000 | ORAL_TABLET | Freq: Four times a day (QID) | ORAL | 0 refills | Status: DC | PRN

## 2016-09-06 MED ORDER — HYDROCODONE-ACETAMINOPHEN 5-325 MG PO TABS
ORAL_TABLET | ORAL | Status: AC
  Filled 2016-09-06: qty 1

## 2016-09-06 MED ORDER — FENTANYL CITRATE (PF) 100 MCG/2ML IJ SOLN
50.0000 ug | INTRAMUSCULAR | Status: DC | PRN
  Administered 2016-09-06: 100 ug via INTRAVENOUS

## 2016-09-06 MED ORDER — SCOPOLAMINE 1 MG/3DAYS TD PT72
MEDICATED_PATCH | TRANSDERMAL | Status: AC
  Filled 2016-09-06: qty 1

## 2016-09-06 MED ORDER — LACTATED RINGERS IV SOLN
INTRAVENOUS | Status: DC
  Administered 2016-09-06 (×2): via INTRAVENOUS

## 2016-09-06 MED ORDER — MIDAZOLAM HCL 2 MG/2ML IJ SOLN
INTRAMUSCULAR | Status: AC
  Filled 2016-09-06: qty 2

## 2016-09-06 MED ORDER — DEXAMETHASONE SODIUM PHOSPHATE 10 MG/ML IJ SOLN
INTRAMUSCULAR | Status: DC | PRN
  Administered 2016-09-06: 10 mg via INTRAVENOUS

## 2016-09-06 MED ORDER — SODIUM CHLORIDE 0.9 % IV SOLN: 250.0000 mL | INTRAVENOUS | Status: DC | PRN

## 2016-09-06 MED ORDER — CLINDAMYCIN PHOSPHATE 600 MG/50ML IV SOLN
600.0000 mg | Freq: Once | INTRAVENOUS | Status: AC
  Administered 2016-09-06: 600 mg via INTRAVENOUS

## 2016-09-06 MED ORDER — LACTATED RINGERS IV SOLN: INTRAVENOUS | Status: DC

## 2016-09-06 MED ORDER — ACETAMINOPHEN 650 MG RE SUPP: 650.0000 mg | RECTAL | Status: DC | PRN

## 2016-09-06 MED ORDER — LIDOCAINE-EPINEPHRINE (PF) 1 %-1:200000 IJ SOLN
INTRAMUSCULAR | Status: DC | PRN
  Administered 2016-09-06: 10 mL via INTRAMUSCULAR

## 2016-09-06 MED ORDER — MIDAZOLAM HCL 2 MG/2ML IJ SOLN
1.0000 mg | INTRAMUSCULAR | Status: DC | PRN
  Administered 2016-09-06: 2 mg via INTRAVENOUS

## 2016-09-06 MED ORDER — METOCLOPRAMIDE HCL 5 MG/ML IJ SOLN: 10.0000 mg | Freq: Once | INTRAMUSCULAR | Status: DC | PRN

## 2016-09-06 MED ORDER — SODIUM CHLORIDE 0.9% FLUSH: 3.0000 mL | Freq: Two times a day (BID) | INTRAVENOUS | Status: DC

## 2016-09-06 MED ORDER — LIDOCAINE HCL (CARDIAC) 20 MG/ML IV SOLN
INTRAVENOUS | Status: DC | PRN
  Administered 2016-09-06: 60 mg via INTRAVENOUS

## 2016-09-06 MED ORDER — HYDROCODONE-ACETAMINOPHEN 5-325 MG PO TABS
1.0000 | ORAL_TABLET | ORAL | Status: DC | PRN
  Administered 2016-09-06: 1 via ORAL

## 2016-09-06 MED ORDER — ONDANSETRON HCL 4 MG/2ML IJ SOLN
INTRAMUSCULAR | Status: DC | PRN
  Administered 2016-09-06: 4 mg via INTRAVENOUS

## 2016-09-06 MED ORDER — MEPERIDINE HCL 25 MG/ML IJ SOLN
6.2500 mg | INTRAMUSCULAR | Status: DC | PRN
Start: 2016-09-06 — End: 2016-09-06

## 2016-09-06 MED ORDER — FENTANYL CITRATE (PF) 100 MCG/2ML IJ SOLN
25.0000 ug | INTRAMUSCULAR | Status: DC | PRN
  Administered 2016-09-06: 50 ug via INTRAVENOUS
  Administered 2016-09-06: 25 ug via INTRAVENOUS
  Administered 2016-09-06: 50 ug via INTRAVENOUS

## 2016-09-06 SURGICAL SUPPLY — 44 items
ADH SKN CLS APL DERMABOND .7 (GAUZE/BANDAGES/DRESSINGS) ×1
BINDER BREAST LRG (GAUZE/BANDAGES/DRESSINGS) ×1 IMPLANT
BLADE SURG 10 STRL SS (BLADE) ×2 IMPLANT
CANISTER SUCT 1200ML W/VALVE (MISCELLANEOUS) IMPLANT
CHLORAPREP W/TINT 26ML (MISCELLANEOUS) ×2 IMPLANT
CLIP TI LARGE 6 (CLIP) ×2 IMPLANT
COVER BACK TABLE 60X90IN (DRAPES) ×2 IMPLANT
COVER MAYO STAND STRL (DRAPES) ×2 IMPLANT
COVER PROBE W GEL 5X96 (DRAPES) ×2 IMPLANT
DERMABOND ADVANCED (GAUZE/BANDAGES/DRESSINGS) ×1
DERMABOND ADVANCED .7 DNX12 (GAUZE/BANDAGES/DRESSINGS) ×1 IMPLANT
DEVICE DUBIN W/COMP PLATE 8390 (MISCELLANEOUS) ×2 IMPLANT
DRAPE LAPAROSCOPIC ABDOMINAL (DRAPES) ×2 IMPLANT
DRAPE UTILITY XL STRL (DRAPES) ×2 IMPLANT
ELECT COATED BLADE 2.86 ST (ELECTRODE) ×2 IMPLANT
ELECT REM PT RETURN 9FT ADLT (ELECTROSURGICAL) ×2
ELECTRODE REM PT RTRN 9FT ADLT (ELECTROSURGICAL) ×1 IMPLANT
GAUZE SPONGE 4X4 12PLY STRL LF (GAUZE/BANDAGES/DRESSINGS) ×2 IMPLANT
GLOVE BIO SURGEON STRL SZ 6 (GLOVE) ×2 IMPLANT
GLOVE BIOGEL PI IND STRL 6.5 (GLOVE) ×1 IMPLANT
GLOVE BIOGEL PI INDICATOR 6.5 (GLOVE) ×1
GOWN STRL REUS W/ TWL LRG LVL3 (GOWN DISPOSABLE) ×1 IMPLANT
GOWN STRL REUS W/TWL 2XL LVL3 (GOWN DISPOSABLE) ×2 IMPLANT
GOWN STRL REUS W/TWL LRG LVL3 (GOWN DISPOSABLE) ×2
KIT MARKER MARGIN INK (KITS) ×2 IMPLANT
LIGHT WAVEGUIDE WIDE FLAT (MISCELLANEOUS) ×1 IMPLANT
NDL HYPO 25X1 1.5 SAFETY (NEEDLE) ×1 IMPLANT
NEEDLE HYPO 25X1 1.5 SAFETY (NEEDLE) ×2 IMPLANT
NS IRRIG 1000ML POUR BTL (IV SOLUTION) ×2 IMPLANT
PACK BASIN DAY SURGERY FS (CUSTOM PROCEDURE TRAY) ×2 IMPLANT
PENCIL BUTTON HOLSTER BLD 10FT (ELECTRODE) ×2 IMPLANT
SLEEVE SCD COMPRESS KNEE MED (MISCELLANEOUS) ×2 IMPLANT
SPONGE LAP 18X18 X RAY DECT (DISPOSABLE) ×2 IMPLANT
STRIP CLOSURE SKIN 1/2X4 (GAUZE/BANDAGES/DRESSINGS) ×2 IMPLANT
SUT MNCRL AB 4-0 PS2 18 (SUTURE) ×2 IMPLANT
SUT VIC AB 2-0 SH 27 (SUTURE) ×2
SUT VIC AB 2-0 SH 27XBRD (SUTURE) ×1 IMPLANT
SUT VIC AB 3-0 SH 27 (SUTURE) ×2
SUT VIC AB 3-0 SH 27X BRD (SUTURE) ×1 IMPLANT
SYR CONTROL 10ML LL (SYRINGE) ×2 IMPLANT
TOWEL OR 17X24 6PK STRL BLUE (TOWEL DISPOSABLE) ×2 IMPLANT
TOWEL OR NON WOVEN STRL DISP B (DISPOSABLE) ×1 IMPLANT
TUBE CONNECTING 20X1/4 (TUBING) ×1 IMPLANT
YANKAUER SUCT BULB TIP NO VENT (SUCTIONS) ×1 IMPLANT

## 2016-09-06 NOTE — Op Note (Addendum)
Right Breast Radioactive seed localized lumpectomy and reexcision lumpectomy right breast  Indications: This patient presents with history of right breast cancer (2 mm) with DCIS, close margin x 2, MRI with suspicious area and positive biopsy for dcis with suspicion for microinvasion.    Pre-operative Diagnosis: right breast cancer  Post-operative Diagnosis: Same  Surgeon: [name]   Anesthesia: General endotracheal anesthesia  ASA Class: 2  Procedure Details  The patient was seen in the Holding Room. The risks, benefits, complications, treatment options, and expected outcomes were discussed with the patient. The possibilities of bleeding, infection, the need for additional procedures, failure to diagnose a condition, and creating a complication requiring transfusion or operation were discussed with the patient. The patient concurred with the proposed plan, giving informed consent.  The site of surgery properly noted/marked. The patient was taken to Operating Room #8 , identified, and the procedure verified as right breast seed localized Lumpectomy, and reexcision lumpectomy. A Time Out was held and the above information confirmed.  The right breast and chest were prepped and draped in standard fashion. The lumpectomy was performed by opening the prior circumareolar incision near the previously placed radioactive seed.  Dissection was carried down to around the point of maximum signal intensity. However, the tissue was very friable, and the seed and clip came out of the specimen.  The cautery was used to perform the dissection. Additional superolateral margin was taken as well.   Hemostasis was achieved with cautery. The edges of the cavity were marked with large clips in the new superior and lateral locations.   The specimens were inked with the margin marker paint kit.    Specimen radiography confirmed inclusion of the mammographic lesion, the clip, and the seed.  The background signal in the  breast was zero.  The wound was irrigated and closed with 3-0 vicryl in layers and 4-0 monocryl subcuticular suture.      Sterile dressings were applied. At the end of the operation, all sponge, instrument, and needle counts were correct.  Findings: grossly clear surgical margins and no adenopathy. Anterior margin is skin.  Estimated Blood Loss:  min         Specimens: left breast tissue and new superolateral margin.         Complications:  None; patient tolerated the procedure well.         Disposition: PACU - hemodynamically stable.         Condition: stable

## 2016-09-06 NOTE — Anesthesia Procedure Notes (Signed)
Procedure Name: LMA Insertion Date/Time: 09/06/2016 1:20 PM Performed by: Maryella Shivers Pre-anesthesia Checklist: Patient identified, Emergency Drugs available, Suction available and Patient being monitored Patient Re-evaluated:Patient Re-evaluated prior to inductionOxygen Delivery Method: Circle system utilized Preoxygenation: Pre-oxygenation with 100% oxygen Intubation Type: IV induction Ventilation: Mask ventilation without difficulty LMA: LMA inserted LMA Size: 4.0 Number of attempts: 1 Airway Equipment and Method: Bite block Placement Confirmation: positive ETCO2 Tube secured with: Tape Dental Injury: Teeth and Oropharynx as per pre-operative assessment

## 2016-09-06 NOTE — Transfer of Care (Signed)
Immediate Anesthesia Transfer of Care Note  Patient: LAKETTA SODERBERG  Procedure(s) Performed: Procedure(s): RE-EXCISION OF RIGHT BREAST LUMPECTOMY WITH RADIOACTIVE SEED LOCALIZATION (Right)  Patient Location: PACU  Anesthesia Type:General  Level of Consciousness: awake and patient cooperative  Airway & Oxygen Therapy: Patient Spontanous Breathing and Patient connected to T-piece oxygen  Post-op Assessment: Report given to RN and Post -op Vital signs reviewed and stable  Post vital signs: Reviewed and stable  Last Vitals:  Vitals:   09/06/16 1117  BP: 137/67  Pulse: 73  Resp: 18  Temp: 36.7 C    Last Pain:  Vitals:   09/06/16 1129  TempSrc:   PainSc: 0-No pain      Patients Stated Pain Goal: 2 (49/44/96 [id number])  Complications: No apparent anesthesia complications

## 2016-09-06 NOTE — Interval H&P Note (Signed)
History and Physical Interval Note:  09/06/2016 12:56 PM  Dawn West  has presented today for surgery, with the diagnosis of RIGHT BREAST CANCER  The various methods of treatment have been discussed with the patient and family. After consideration of risks, benefits and other options for treatment, the patient has consented to  Procedure(s): RE-EXCISION OF RIGHT BREAST LUMPECTOMY WITH RADIOACTIVE SEED LOCALIZATION (Right) as a surgical intervention .  The patient's history has been reviewed, patient examined, no change in status, stable for surgery.  I have reviewed the patient's chart and labs.  Questions were answered to the patient's satisfaction.     Naseem Adler

## 2016-09-06 NOTE — H&P (Signed)
Dawn West 08/30/2016 4:40 PM Location: [facility] Patient #: [id number] DOB: 1950-06-01 Married / Language: Dawn West / Race: White Female   History of Present Illness Dawn Klein MD; 08/30/2016 6:49 PM) The patient is a 66 year old female who presents for a follow-up for Breast cancer. Pt is a 66 yo F who presented with screening detected calcifications in January 2018 She had a 2.2 cm area in the upper inner quadrant of her right breast. Core needle biopsy was performed and demonstrated high grade DCIS with suspicion for invasion. This was ER/PR negative. She had lumpectomy/SLN 06/02/2016. Nodes were negative, but one margin was close. We discussed plan and determined to go ahead with knee surgery and have reexcision toward the end of PT followed by XRT. She is having some breast soreness, but most pain is secondary to knee. She went back for reexcision and had an additional 1.5 cm of DCIS with another close margin. We got MRI to evaluate both breasts prior to another reexcision.   The MRI showed additional area of concern that was biopsied and was positive for DCIS with concern for microinvasion. To date the only invasive cancer present is 3 mm of ER/PR -, her 2 positive cancer.   Repeat biopsy anterior area 08/26/2016 Diagnosis Breast, right, needle core biopsy, superior anterior - HIGH GRADE, DUCTAL CARCINOMA IN SITU - SEE COMMENT Microscopic Comment The biopsy material has high grade ductal carcinoma in situ with foci suspicious for invasion.  Surg pathology 07/26/16 Diagnosis Breast, excision, Right new Superior Margin - DUCTAL CARCINOMA IN SITU WITH CALCIFICATIONS, HIGH GRADE, SPANNING AT LEAST 1.5 CM. - FOCI HIGHLY SUSPICIOUS FOR EARLY STROMAL INVASION. - DUCTAL CARCINOMA IN SITU IS BROADLY LESS THAN 0.1 CM TO THE NEW SUPERIOR MARGIN. - SEE COMMENT. Microscopic Comment High grade ductal carcinoma in situ is present in multiple blocks submitted for histologic  evaluation and spans likely at least 1.5 cm. In addition, there are a few scattered microscopic foci highly suspicious for early invasive carcinoma. The ductal carcinoma in situ is broadly less than 0.1 cm to the new superior margin of the specimen.  MRI 08/11/16 Right breast: There is a large postoperative seroma in the superior aspect of the right breast. There is a thin rim of enhancement around the seroma consistent with postoperative change. Along the anterior aspect of the seroma there is clumped asymmetric nodular enhancement spanning an area of approximately 2.1 x 2.0 x 1.7 cm. Ductal carcinoma in-situ could not be excluded.  Left breast: No mass or abnormal enhancement.  Lymph nodes: No abnormal appearing lymph nodes.  Ancillary findings: None.  IMPRESSION: Suspicious clumped nodular enhancement along the anterior margin of the lumpectomy site.  ER/PR negative, Her 2 positive.   Allergies (Dawn West, CMA; 08/30/2016 4:40 PM) Allergies Reconciled   Medication History (Dawn West, CMA; 08/30/2016 4:41 PM) Medications Reconciled    Review of Systems Dawn Klein MD; 08/30/2016 6:46 PM) All other systems negative  Vitals (Dawn West CMA; 08/30/2016 4:42 PM) 08/30/2016 4:41 PM Weight: 147.6 lb Height: 63in Body Surface Area: 1.7 m Body Mass Index: 26.15 kg/m  Temp.: 98.24F  Pulse: 90 (Regular)  BP: 140/80 (Sitting, Left Arm, Standard)       Physical Exam Dawn Klein MD; 08/30/2016 6:50 PM) Breast Note: moderate right seroma. no erythema.     Assessment & Plan Dawn Klein MD; 08/30/2016 6:50 PM) CARCINOMA OF UPPER-INNER QUADRANT OF RIGHT BREAST IN FEMALE, ESTROGEN RECEPTOR NEGATIVE (C50.211) Impression: Setting up reexcision.  Will discuss with radiology whether seed is needed or not.  Posting sheet filled out and orders written. Current Plans You are being scheduled for surgery- Our schedulers will call you.  You should  hear from our office's scheduling department within 5 working days about the location, date, and time of surgery. We try to make accommodations for patient's preferences in scheduling surgery, but sometimes the OR schedule or the surgeon's schedule prevents [place] from making those accommodations.  If you have not heard from our office [phone]) in 5 working days, call the office and ask for your surgeon's nurse.  If you have other questions about your diagnosis, plan, or surgery, call the office and ask for your surgeon's nurse.    Signed by Dawn Klein, MD (08/30/2016 6:50 PM)

## 2016-09-06 NOTE — Anesthesia Preprocedure Evaluation (Signed)
Anesthesia Evaluation  Patient identified by MRN, date of birth, ID band Patient awake    Reviewed: Allergy & Precautions, NPO status , Patient's Chart, lab work & pertinent test results  History of Anesthesia Complications (+) PONV  Airway Mallampati: II  TM Distance: >3 FB Neck ROM: Full    Dental no notable dental hx.    Pulmonary neg pulmonary ROS,    Pulmonary exam normal breath sounds clear to auscultation       Cardiovascular negative cardio ROS Normal cardiovascular exam Rhythm:Regular Rate:Normal     Neuro/Psych negative neurological ROS  negative psych ROS   GI/Hepatic negative GI ROS, Neg liver ROS,   Endo/Other  negative endocrine ROS  Renal/GU negative Renal ROS  negative genitourinary   Musculoskeletal negative musculoskeletal ROS (+)   Abdominal   Peds negative pediatric ROS (+)  Hematology negative hematology ROS (+)   Anesthesia Other Findings   Reproductive/Obstetrics negative OB ROS                             Anesthesia Physical Anesthesia Plan  ASA: II  Anesthesia Plan: General   Post-op Pain Management:    Induction: Intravenous  Airway Management Planned: LMA  Additional Equipment:   Intra-op Plan:   Post-operative Plan: Extubation in OR  Informed Consent: I have reviewed the patients History and Physical, chart, labs and discussed the procedure including the risks, benefits and alternatives for the proposed anesthesia with the patient or authorized representative who has indicated his/her understanding and acceptance.   Dental advisory given  Plan Discussed with: CRNA  Anesthesia Plan Comments:         Anesthesia Quick Evaluation

## 2016-09-06 NOTE — Anesthesia Postprocedure Evaluation (Signed)
Anesthesia Post Note  Patient: Dawn West  Procedure(s) Performed: Procedure(s) (LRB): RE-EXCISION OF RIGHT BREAST LUMPECTOMY WITH RADIOACTIVE SEED LOCALIZATION (Right)  Patient location during evaluation: PACU Anesthesia Type: General Level of consciousness: awake and alert Pain management: pain level controlled Vital Signs Assessment: post-procedure vital signs reviewed and stable Respiratory status: spontaneous breathing, nonlabored ventilation, respiratory function stable and patient connected to nasal cannula oxygen Cardiovascular status: blood pressure returned to baseline and stable Postop Assessment: no signs of nausea or vomiting Anesthetic complications: no       Last Vitals:  Vitals:   09/06/16 1530 09/06/16 1600  BP: 115/61 140/60  Pulse: 80 71  Resp: 14 16  Temp:  36.8 C    Last Pain:  Vitals:   09/06/16 1600  TempSrc:   PainSc: 3                  Montez Hageman

## 2016-09-06 NOTE — Discharge Instructions (Addendum)
[facility] °Office Phone Number [phone] ° °BREAST BIOPSY/ PARTIAL MASTECTOMY: POST OP INSTRUCTIONS ° °Always review your discharge instruction sheet given to you by the facility where your surgery was performed. ° °IF YOU HAVE DISABILITY OR FAMILY LEAVE FORMS, YOU MUST BRING THEM TO THE OFFICE FOR PROCESSING.  DO NOT GIVE THEM TO YOUR DOCTOR. ° °1. A prescription for pain medication may be given to you upon discharge.  Take your pain medication as prescribed, if needed.  If narcotic pain medicine is not needed, then you may take acetaminophen (Tylenol) or ibuprofen (Advil) as needed. °2. Take your usually prescribed medications unless otherwise directed °3. If you need a refill on your pain medication, please contact your pharmacy.  They will contact our office to request authorization.  Prescriptions will not be filled after 5pm or on week-ends. °4. You should eat very light the first 24 hours after surgery, such as soup, crackers, pudding, etc.  Resume your normal diet the day after surgery. °5. Most patients will experience some swelling and bruising in the breast.  Ice packs and a good support bra will help.  Swelling and bruising can take several days to resolve.  °6. It is common to experience some constipation if taking pain medication after surgery.  Increasing fluid intake and taking a stool softener will usually help or prevent this problem from occurring.  A mild laxative (Milk of Magnesia or Miralax) should be taken according to package directions if there are no bowel movements after 48 hours. °7. Unless discharge instructions indicate otherwise, you may remove your bandages 48 hours after surgery, and you may shower at that time.  You may have steri-strips (small skin tapes) in place directly over the incision.  These strips should be left on the skin for 7-10 days.   Any sutures or staples will be removed at the office during your follow-up visit. °8. ACTIVITIES:  You may resume  regular daily activities (gradually increasing) beginning the next day.  Wearing a good support bra or sports bra (or the breast binder) minimizes pain and swelling.  You may have sexual intercourse when it is comfortable. °a. You may drive when you no longer are taking prescription pain medication, you can comfortably wear a seatbelt, and you can safely maneuver your car and apply brakes. °b. RETURN TO WORK:  __________1 week_______________ °9. You should see your doctor in the office for a follow-up appointment approximately two weeks after your surgery.  Your doctor’s nurse will typically make your follow-up appointment when she calls you with your pathology report.  Expect your pathology report 2-3 business days after your surgery.  You may call to check if you do not hear from us after three days. ° ° °WHEN TO CALL YOUR DOCTOR: °1. Fever over 101.0 °2. Nausea and/or vomiting. °3. Extreme swelling or bruising. °4. Continued bleeding from incision. °5. Increased pain, redness, or drainage from the incision. ° °The clinic staff is available to answer your questions during regular business hours.  Please don’t hesitate to call and ask to speak to one of the nurses for clinical concerns.  If you have a medical emergency, go to the nearest emergency room or call 911.  A surgeon from [facility] is always on call at the hospital. ° °For further questions, please visit [email or web address]  ° ° °Post Anesthesia Home Care Instructions ° °Activity: °Get plenty of rest for the remainder of the day. A responsible individual must stay with you for 24   hours following the procedure.  °For the next 24 hours, DO NOT: °-Drive a car °-Operate machinery °-Drink alcoholic beverages °-Take any medication unless instructed by your physician °-Make any legal decisions or sign important papers. ° °Meals: °Start with liquid foods such as gelatin or soup. Progress to regular foods as tolerated. Avoid greasy, spicy,  heavy foods. If nausea and/or vomiting occur, drink only clear liquids until the nausea and/or vomiting subsides. Call your physician if vomiting continues. ° °Special Instructions/Symptoms: °Your throat may feel dry or sore from the anesthesia or the breathing tube placed in your throat during surgery. If this causes discomfort, gargle with warm salt water. The discomfort should disappear within 24 hours. ° °If you had a scopolamine patch placed behind your ear for the management of post- operative nausea and/or vomiting: ° °1. The medication in the patch is effective for 72 hours, after which it should be removed.  Wrap patch in a tissue and discard in the trash. Wash hands thoroughly with soap and water. °2. You may remove the patch earlier than 72 hours if you experience unpleasant side effects which may include dry mouth, dizziness or visual disturbances. °3. Avoid touching the patch. Wash your hands with soap and water after contact with the patch. °  ° °

## 2016-09-07 ENCOUNTER — Encounter (HOSPITAL_BASED_OUTPATIENT_CLINIC_OR_DEPARTMENT_OTHER): Payer: Self-pay | Admitting: General Surgery

## 2016-09-07 DIAGNOSIS — M1711 Unilateral primary osteoarthritis, right knee: Secondary | ICD-10-CM | POA: Diagnosis not present

## 2016-09-07 DIAGNOSIS — G8929 Other chronic pain: Secondary | ICD-10-CM | POA: Diagnosis not present

## 2016-09-07 DIAGNOSIS — M25561 Pain in right knee: Secondary | ICD-10-CM | POA: Diagnosis not present

## 2016-09-13 NOTE — Progress Notes (Signed)
Had to discuss with pathology.  Final margins clear! Please let patient know.

## 2016-09-16 NOTE — Progress Notes (Addendum)
Location of Breast Cancer: upper inner quadrant of the right breast   Histology per Pathology Report:   09/06/16 Diagnosis 1. Breast, excision, Right Superior Margin - INVASIVE DUCTAL CARCINOMA, GRADE III/III, SCATTERED MICROSCOPIC FOCI. - DUCTAL CARCINOMA IN SITU WITH CALCIFICATIONS, HIGH GRADE. - INVASIVE CARCINOMA IS FOCALLY LESS THAN 0.1 CM TO THE INFERIOR MARGIN OF SPECIMEN #1.. - DUCTAL CARCINOMA IN SITU IS BROADLY LESS THAN 0.1 CM TO THE LATERAL MARGIN, FOCALLY LESS THAN 0.1 CM TO THE INFERIOR MARGIN, AND FOCALLY LESS THAN 0.1 CM TO THE ANTERIOR MARGIN OF SPECIMEN #1. - SEE COMMENT. 2. Breast, excision, Right Superolateral Margin - FIBROCYSTIC CHANGES. - THERE IS NO EVIDENCE OF MALIGNANCY. - SEE COMMENT.  08/26/16 Diagnosis Breast, right, needle core biopsy, superior anterior - HIGH GRADE, DUCTAL CARCINOMA IN SITU - SEE COMMENT  07/26/16 Diagnosis Breast, excision, Right new Superior Margin - DUCTAL CARCINOMA IN SITU WITH CALCIFICATIONS, HIGH GRADE, SPANNING AT LEAST 1.5 CM. - FOCI HIGHLY SUSPICIOUS FOR EARLY STROMAL INVASION. - DUCTAL CARCINOMA IN SITU IS BROADLY LESS THAN 0.1 CM TO THE NEW SUPERIOR MARGIN.  06/02/16 Diagnosis 1. Breast, lumpectomy, Right w/seed - INVASIVE DUCTAL CARCINOMA, GRADE 2, SPANNING 0.3 CM. - HIGH GRADE DUCTAL CARCINOMA IN SITU WITH NECROSIS. - INVASIVE CARCINOMA COMES TO WITHIN <0.1 CM OF THE POSTERIOR MARGIN FOCALLY. - DUCTAL CARCINOMA IN SITU COMES TO WITHIN <0.1 CM OF THE POSTERIOR MARGIN FOCALLY AND THE SUPERIOR MARGIN FOCALLY. - BIOPSY SITE. - SEE ONCOLOGY TABLE. 2. Breast, excision, Right additional Medial Margin - FIBROCYSTIC CHANGE. - USUAL DUCTAL HYPERPLASIA. - NO MALIGNANCY IDENTIFIED. 3. Lymph node, sentinel, biopsy, Right Axillary #1 - ONE OF ONE LYMPH NODES NEGATIVE FOR CARCINOMA (0/1). 4. Lymph node, sentinel, biopsy, Right Axillary - ONE OF ONE LYMPH NODES NEGATIVE FOR CARCINOMA (0/1).  05/19/16 Diagnosis Breast, right,  needle core biopsy, UIQ - DUCTAL CARCINOMA WITH CALCIFICATIONS. - SEE COMMENT.  Receptor Status: ER(0), PR (0), Her2-neu (positive), Ki-(10%)  Did patient present with symptoms (if so, please note symptoms) or was this found on screening mammography?: screening mammogram    Past/Anticipated interventions by surgeon, if any:  09/06/16 - Procedure: RE-EXCISION OF RIGHT BREAST LUMPECTOMY WITH RADIOACTIVE SEED LOCALIZATION;  Surgeon: Dawn Klein, MD 07/26/16 - Procedure: RE-EXCISION OF RIGHT BREAST LUMPECTOMY;  Surgeon: Dawn Klein, MD 05/19/16 - Procedure: RIGHT BREAST LUMPECTOMY WITH RADIOACTIVE SEED AND SENTINEL LYMPH NODE BIOPSY;  Surgeon: Dawn Klein, MD   Past/Anticipated interventions by medical oncology, if any: no  Lymphedema issues, if any:  no    Pain issues, if any: no pain for the past few days  SAFETY ISSUES:  Prior radiation? no  Pacemaker/ICD? no  Possible current pregnancy?no  Is the patient on methotrexate? no  Current Complaints / other details:    BP 139/81 (BP Location: Left Arm, Patient Position: Sitting)   Pulse 70   Temp 98.7 F (37.1 C) (Oral)   Ht '5\' 3"'$  (1.6 m)   Wt 146 lb 6.4 oz (66.4 kg)   LMP 12/12/1996 (Exact Date)   SpO2 99%   BMI 25.93 kg/m    Wt Readings from Last 3 Encounters:  09/22/16 146 lb 6.4 oz (66.4 kg)  09/06/16 145 lb (65.8 kg)  07/20/16 143 lb 1.6 oz (64.9 kg)

## 2016-09-22 ENCOUNTER — Encounter: Payer: Self-pay | Admitting: Radiation Oncology

## 2016-09-22 ENCOUNTER — Ambulatory Visit
Admission: RE | Admit: 2016-09-22 | Discharge: 2016-09-22 | Disposition: A | Discharge status: Home / Self Care | Payer: Medicare Other | Source: Ambulatory Visit | Attending: Radiation Oncology | Admitting: Radiation Oncology

## 2016-09-22 DIAGNOSIS — Z171 Estrogen receptor negative status [ER-]: Secondary | ICD-10-CM | POA: Diagnosis not present

## 2016-09-22 DIAGNOSIS — Z880 Allergy status to penicillin: Secondary | ICD-10-CM | POA: Insufficient documentation

## 2016-09-22 DIAGNOSIS — Z7901 Long term (current) use of anticoagulants: Secondary | ICD-10-CM | POA: Insufficient documentation

## 2016-09-22 DIAGNOSIS — C50911 Malignant neoplasm of unspecified site of right female breast: Secondary | ICD-10-CM | POA: Diagnosis not present

## 2016-09-22 DIAGNOSIS — N641 Fat necrosis of breast: Secondary | ICD-10-CM | POA: Diagnosis not present

## 2016-09-22 DIAGNOSIS — D0511 Intraductal carcinoma in situ of right breast: Secondary | ICD-10-CM

## 2016-09-22 DIAGNOSIS — Z888 Allergy status to other drugs, medicaments and biological substances status: Secondary | ICD-10-CM | POA: Diagnosis not present

## 2016-09-22 DIAGNOSIS — Z9889 Other specified postprocedural states: Secondary | ICD-10-CM | POA: Diagnosis not present

## 2016-09-22 NOTE — Progress Notes (Signed)
Radiation Oncology         [phone] ________________________________  Name: Dawn West MRN: [id number]  Date: 09/22/2016  DOB: 02-17-1951  Follow-Up Visit Note  CC: [facility], [facility]  Truitt Merle, MD    Diagnosis:   Stage  (pT1a, pN0), grade 2 invasive ductal carcinoma of the right breast with high grade ductal carcinoma In Situ (ER negative, PR negative, HER2 positive)   Narrative:  The patient returns today for routine follow-up. The patient was seen in breast clinic on 05/26/16. She underwent a screening mammogram which showed calcifications within the upper  aspect of the right breast. This extended over approximately 2.2 cm and a linear orientation. No mass was appreciated on imaging. Patient proceeded to undergo biopsy of this area on 05/19/16 which revealed high-grade ductal carcinoma in situ (ER/PR negative). The biopsy did show foci highly suspicious for stromal invasion.  The patient underwent a right lumpectomy and SLN biopsy on 06/02/16. This revealed grade 2 IDC spanning 0.3 cm and high grade DCIS with necrosis. The invasive carcinoma came to within <0.1 cm of the posterior margin focally and the DCIS came to within <0.1 cm of the posterior margin focally and the superior margin focally. Excision of the right additional medial margin revealed fibrocystic change and usual ductal hyperplasia with no malignancy identified. Two biopsied right axillary sentinel lymph nodes were negative. The invasive disease was ER negative, PR negative, and HER2 positive.  The patient underwent a left total knee arthroplasty on 06/10/16.  Patient was reexcised on 07/26/16. Pathology revealed DCIS came within <1.0 cm of new superior margin. Subsequent MRI showed suspicious area along anterior margin of lumpectomy site.  Biopsy on 08/26/16 was positive for high grade DCIS with foci suspicious for invasion.  On 09/06/16, Dr. Barry Dienes performed a reexcision of right breast lumpectomy with  a radioactive seed localization. Pathological findings were grossly clear surgical margins.   Pt is scheduled for a f/u with Dr. Barry Dienes next Monday.  Patient relates that Dr. Burr Medico recommended discontinuation of hormonal therapy at initial consultation.                          ALLERGIES:  is allergic to allegra [fexofenadine hcl]; ciprofloxacin; estradiol; oxycodone; penicillins; and sulfa antibiotics.  Meds: Current Outpatient Prescriptions  Medication Sig Dispense Refill  . acetaminophen (TYLENOL) 650 MG CR tablet Take 1,300 mg by mouth every 8 (eight) hours as needed for pain.    . Calcium Carb-Cholecalciferol (CALCIUM 600 + D) 600-200 MG-UNIT TABS Take 1 tablet by mouth 2 (two) times daily.     . cetirizine (ZYRTEC) 10 MG tablet Take 10 mg by mouth at bedtime. At 10 pm daily    . diphenhydrAMINE (EQ NIGHTTIME SLEEP AID) 25 MG tablet Take 25 mg by mouth at bedtime as needed for sleep.    . fluticasone (FLONASE) 50 MCG/ACT nasal spray Place 1 spray into both nostrils daily.     . Glucos-Chond-Hyal Ac-Ca Fructo (MOVE FREE JOINT HEALTH ADVANCE PO) Take 1 tablet by mouth daily.    Marland Kitchen HYDROcodone-acetaminophen (NORCO/VICODIN) 5-325 MG tablet Take 1-2 tablets by mouth every 6 (six) hours as needed for moderate pain (depends on pain level). 30 tablet 0  . Magnesium 250 MG TABS Take 250 mg by mouth 3 (three) times daily.     . Multiple Vitamin (MULTIVITAMIN) tablet Take 1 tablet by mouth daily.    . Omega-3 Fatty Acids (FISH OIL) 1000  MG CAPS Take 1,000 mg by mouth 3 (three) times daily. Takes 1-3 times daily    . pravastatin (PRAVACHOL) 40 MG tablet Take 1 tablet (40 mg total) by mouth daily. (Patient taking differently: Take 40 mg by mouth every evening. At 10 pm) 30 tablet 3  . sertraline (ZOLOFT) 100 MG tablet TAKE 1 TABLET EVERY DAY 90 tablet 0  . tetrahydrozoline 0.05 % ophthalmic solution Place 1 drop into both eyes daily as needed (dry eyes).    . Vitamin D, Ergocalciferol, (DRISDOL) 50000  units CAPS capsule Take 1 capsule (50,000 Units total) by mouth every 7 (seven) days. (Patient taking differently: Take 50,000 Units by mouth See admin instructions. Takes every other week on Friday) 30 capsule 2  . vitamin E 400 UNIT capsule Take 400 Units by mouth 3 (three) times daily.     Marland Kitchen zinc gluconate 50 MG tablet Take 50 mg by mouth daily.    . diclofenac (VOLTAREN) 75 MG EC tablet Take 75 mg by mouth 2 (two) times daily.    Marland Kitchen docusate sodium (COLACE) 100 MG capsule Take 1 capsule (100 mg total) by mouth 2 (two) times daily. (Patient not taking: Reported on 09/22/2016) 10 capsule 0  . magnesium hydroxide (MILK OF MAGNESIA) 400 MG/5ML suspension Take 5 mLs by mouth daily as needed for mild constipation.     . sodium chloride (OCEAN) 0.65 % SOLN nasal spray Place 1 spray into both nostrils 2 (two) times daily as needed for congestion.     No current facility-administered medications for this encounter.     Physical Findings: The patient is in no acute distress. Patient is alert and oriented.  height is '5\' 3"'$  (1.6 m) and weight is 146 lb 6.4 oz (66.4 kg). Her oral temperature is 98.7 F (37.1 C). Her blood pressure is 139/81 and her pulse is 70. Her oxygen saturation is 99%. .  No significant changes. Lungs are clear to auscultation bilaterally. Heart has regular rate and rhythm. No palpable cervical, supraclavicular, or axillary adenopathy. Right breast: patient has a periareolar scar along the lateral aspect of the nipple-areolar complex she is healing well with no signs of drainage or infection. Pt has some mild nipple retraction. She has an excellent cosmetic result given that she has had two reexcisions after her initial operation. Left breast: no palpable mass or nipple discharge.   Lab Findings: Lab Results  Component Value Date   WBC 5.2 07/20/2016   HGB 11.9 (L) 07/20/2016   HCT 36.8 07/20/2016   MCV 88.2 07/20/2016   PLT 327 07/20/2016    Radiographic Findings: Mm Breast  Surgical Specimen  Result Date: 09/06/2016 CLINICAL DATA:  Status post radioactive seed localization of an area of enhancement noted along the anterior margin of a lumpectomy site. This was biopsied under MRI guidance with the pathology revealing high-grade ductal carcinoma in situ with foci suspicious for invasion. EXAM: SPECIMEN RADIOGRAPH OF THE RIGHT BREAST COMPARISON:  Previous exam(s). FINDINGS: Status post excision of the right breast. The radioactive seed and biopsy clip were dislodged from the specimen during procedure, and were imaged separately. Fibroglandular type density within the specimen, without a defined mass. The most dense area of tissue is along 1 margin corresponding to grade number A-10 on the specimen mammogram. IMPRESSION: Specimen radiograph of the right breast. Electronically Signed   By: Lajean Manes M.D.   On: 09/06/2016 14:14   Mm Rt Radioactive Seed Loc Mammo Guide  Result Date: 09/02/2016 CLINICAL DATA:  66 year old female for seed localization of a MRI guided right breast biopsy site demonstrating ductal carcinoma in situ. The patient initially underwent a right lumpectomy in January 2018 demonstrating invasive ductal carcinoma and high-grade ductal carcinoma in situ. The patient subsequently underwent re-excision for close margins in March 2018 demonstrating ductal carcinoma in situ, also with close margins. MRI was then performed demonstrating suspicious enhancement along the anterior margin of the lumpectomy site. Subsequent MRI guided biopsy of the enhancement demonstrated high-grade ductal carcinoma in situ. EXAM: MAMMOGRAPHIC GUIDED RADIOACTIVE SEED LOCALIZATION OF THE RIGHT BREAST COMPARISON:  Previous exam(s). FINDINGS: Patient presents for radioactive seed localization prior to right lumpectomy. I met with the patient and we discussed the procedure of seed localization including benefits and alternatives. We discussed the high likelihood of a successful procedure. We  discussed the risks of the procedure including infection, bleeding, tissue injury and further surgery. We discussed the low dose of radioactivity involved in the procedure. Informed, written consent was given. The usual time-out protocol was performed immediately prior to the procedure. Using mammographic guidance, sterile technique, 1% lidocaine and an I-125 radioactive seed, the dumbbell-shaped biopsy marker in the upper, outer right breast was localized using a lateral to medial approach. The follow-up mammogram images demonstrate the seed to be approximately 5 mm lateral to the dumbbell-shaped biopsy marker. The seed is likely within a small residual hematoma at the MRI biopsy site. The images were marked for Dr. Barry Dienes. Follow-up survey of the patient confirms presence of the radioactive seed. Order number of I-125 seed:  [id number]. Total activity:  0.279mi  reference Date: August 10, 2016 The patient tolerated the procedure well and was released from the [facility] She was given instructions regarding seed removal. IMPRESSION: Radioactive seed localization of the right breast. As noted above, the seed is approximately 5 mm lateral to the dumbbell-shaped biopsy marker and is likely within a small residual hematoma at the MRI biopsy site. Electronically Signed   By: EPamelia HoitM.D.   On: 09/02/2016 14:53   Mm Clip Placement Right  Result Date: 08/26/2016 CLINICAL DATA:  Patient status post MRI guided core needle biopsy right breast non mass enhancement EXAM: DIAGNOSTIC RIGHT MAMMOGRAM POST MRI BIOPSY COMPARISON:  Previous exam(s). FINDINGS: Mammographic images were obtained following MRI guided biopsy of right breast non mass enhanced. Dumbbell-shaped marking clip in appropriate position. IMPRESSION: Appropriate position dumbbell-shaped marking clip status post MRI guided core needle biopsy non mass enhancement right breast. Final Assessment: Post Procedure Mammograms for Marker Placement Electronically  Signed   By: DLovey NewcomerM.D.   On: 08/26/2016 09:52   Mr Rt Breast Bx WJohnella MoloneyDev 1st Lesion Image Bx Spec Mr Guide  Addendum Date: 08/27/2016   ADDENDUM REPORT: 08/27/2016 12:40 ADDENDUM: Pathology revealed high grade ductal carcinoma in situ with foci suspicious for invasion in the right breast. This was found to be concordant by Dr. DLovey Newcomer Pathology results were discussed with the patient by telephone. The patient reported doing well after the biopsy. Post biopsy instructions and care were reviewed and questions were answered. The patient was encouraged to call [facility] any additional [facility]. [facility]. Pathology results reported by LSusa RaringRN, BSN on 08/27/2016. Electronically Signed   By: DLovey NewcomerM.D.   On: 08/27/2016 12:40   Result Date: 08/27/2016 CLINICAL DATA:  Patient with prior right lumpectomy and  close margins. Recent MRI demonstrated non mass enhancement along the anterior margin of the lumpectomy site. For MRI guided core needle biopsy. EXAM: MRI GUIDED CORE NEEDLE BIOPSY OF THE RIGHT BREAST TECHNIQUE: Multiplanar, multisequence MR imaging of the 13 breast was performed both before and after administration of intravenous contrast. CONTRAST:  35m MULTIHANCE GADOBENATE DIMEGLUMINE 529 MG/ML IV SOLN COMPARISON:  Previous exams. FINDINGS: I met with the patient, and we discussed the procedure of MRI guided biopsy, including risks, benefits, and alternatives. Specifically, we discussed the risks of infection, bleeding, tissue injury, clip migration, and inadequate sampling. Informed, written consent was given. The usual time out protocol was performed immediately prior to the procedure. Using sterile technique, 1% Lidocaine, MRI guidance, and a 9 gauge vacuum assisted device, biopsy was performed of non mass enhancement within the anterior  superior right breast using a lateral approach. At the conclusion of the procedure, a dumbbell-shaped tissue marker clip was deployed into the biopsy cavity. Follow-up 2-view mammogram was performed and dictated separately. IMPRESSION: MRI guided biopsy of non mass enhancement anterior superior right breast. No apparent complications. Electronically Signed: By: DLovey NewcomerM.D. On: 08/26/2016 09:51    Impression:  Stage  (pT1a, pN0), grade 2 invasive ductal carcinoma of the right breast with high grade ductal cell carcinoma In Situ (ER negative, PR negative, HER2 positive)   Pt has had two additional reexcisions for positive margins with the final surgery clearing the surgical margins. The patient would be a good candidate for breast conservation with radiation therapy directed at right breast area. I discussed with her the anticipated course of treatment, side effects and potential toxicities. She appears to understand and wishes to proceed with planned course of treatment treatment.  Plan:  She is planned for CT simulation on May 29th with treatment to begin the first week of June lasting 5.5 weeks followed by a boost to the lumpectomy cavity.  -----------------------------------  JBlair Promise PhD, MD  This document serves as a record of services personally performed by JGery Pray MD. It was created on his behalf by JLinward Natal a trained medical scribe. The creation of this record is based on the scribe's personal observations and the provider's statements to them. This document has been checked and approved by the attending provider.

## 2016-09-22 NOTE — Progress Notes (Signed)
Please see the Nurse Progress Note in the MD Initial Consult Encounter for this patient. 

## 2016-09-24 ENCOUNTER — Encounter: Payer: Self-pay | Admitting: Nurse Practitioner

## 2016-09-24 ENCOUNTER — Ambulatory Visit (INDEPENDENT_AMBULATORY_CARE_PROVIDER_SITE_OTHER): Payer: Medicare Other | Admitting: Nurse Practitioner

## 2016-09-24 VITALS — BP 116/74 | HR 68 | Ht 62.5 in | Wt 146.0 lb

## 2016-09-24 DIAGNOSIS — C50211 Malignant neoplasm of upper-inner quadrant of right female breast: Secondary | ICD-10-CM

## 2016-09-24 DIAGNOSIS — Z01411 Encounter for gynecological examination (general) (routine) with abnormal findings: Secondary | ICD-10-CM | POA: Diagnosis not present

## 2016-09-24 DIAGNOSIS — E559 Vitamin D deficiency, unspecified: Secondary | ICD-10-CM | POA: Diagnosis not present

## 2016-09-24 DIAGNOSIS — Z124 Encounter for screening for malignant neoplasm of cervix: Secondary | ICD-10-CM

## 2016-09-24 DIAGNOSIS — Z171 Estrogen receptor negative status [ER-]: Secondary | ICD-10-CM

## 2016-09-24 DIAGNOSIS — Z0000 Encounter for general adult medical examination without abnormal findings: Secondary | ICD-10-CM

## 2016-09-24 MED ORDER — SERTRALINE HCL 100 MG PO TABS: 100.0000 mg | ORAL_TABLET | Freq: Every day | ORAL | 4 refills | Status: AC

## 2016-09-24 MED ORDER — VITAMIN D (ERGOCALCIFEROL) 1.25 MG (50000 UNIT) PO CAPS: 50000.0000 [IU] | ORAL_CAPSULE | ORAL | 2 refills | Status: AC

## 2016-09-24 NOTE — Patient Instructions (Signed)

## 2016-09-24 NOTE — Progress Notes (Signed)
Patient ID: Dawn West, female   DOB: 1950/08/04, 66 y.o.   MRN: [id number]  66 y.o. G1P1001 Married  Caucasian Fe here for annual exam.  Right Breast cancer new diagnosis 05/2016.  She had lumpectomy 06/02/16.  Then on 06/10/16 had right knee replacement surgery.  Then due to high grade breast cancer at margins went back and had re excision of breast and necrosis on 07/26/16.  Then re excision again on 09/06/16 with seed implantation. Will have 6 weeks of radiation therapy starting 10/09/16.  Overall she is doing well with a good outlook.  She does not want to do her colonoscopy this fall due to all the surgery and radiation treatment.  She will get done next spring.   Patient's last menstrual period was 12/12/1996 (exact date).          Sexually active: Yes.    The current method of family planning is status post hysterectomy.    Exercising: Yes.    walking and biking Smoker:  no  Health Maintenance: Pap: 03/28/09, Negative (TVH) History of Abnormal Pap: no MMG: see EPIC Imaging Self Breast exams: yes Colonoscopy:  03/31/12, polyp, diverticula, repeat 5 years will repeat next year BMD: 03/22/14 T Score, -0.3 Spine / -1.2 Left Femur Neck TDaP: 2012 Shingles: 10/2014 Pneumonia: 05/2016 Hep C and HIV: 09/19/15 Labs: none today.   reports that she has never smoked. She has never used smokeless tobacco. She reports that she drinks alcohol. She reports that she does not use drugs.  Past Medical History:  Diagnosis Date  . Anxiety   . Arthritis    knees  . Cancer ([place]) 12/01/11   basal cell skin ca Left upper arm  . Cancer ([place]) 04/20/2016   right breast cancer  . Complication of anesthesia   . Depression   . Headache   . Hypercholesteremia   . PONV (postoperative nausea and vomiting)     Past Surgical History:  Procedure Laterality Date  . ABDOMINAL HYSTERECTOMY    . BREAST LUMPECTOMY WITH RADIOACTIVE SEED AND SENTINEL LYMPH NODE BIOPSY Right 06/02/2016   Procedure: RIGHT BREAST  LUMPECTOMY WITH RADIOACTIVE SEED AND SENTINEL LYMPH NODE BIOPSY;  Surgeon: [name], MD;  Location: [facility];  Service: General;  Laterality: Right;  . BREAST LUMPECTOMY WITH RADIOACTIVE SEED LOCALIZATION Right 09/06/2016   Procedure: RE-EXCISION OF RIGHT BREAST LUMPECTOMY WITH RADIOACTIVE SEED LOCALIZATION;  Surgeon: [name], MD;  Location: [facility];  Service: General;  Laterality: Right;  . BREAST SURGERY  05/19/2016   right breast biopsy  . KNEE ARTHROPLASTY Left 06/10/2016   Procedure: LEFT TOTAL KNEE ARTHROPLASTY WITH COMPUTER NAVIGATION;  Surgeon: Rod Can, MD;  Location: [facility];  Service: Orthopedics;  Laterality: Left;  . MOUTH SURGERY    . RE-EXCISION OF BREAST LUMPECTOMY Right 07/26/2016   Procedure: RE-EXCISION OF RIGHT BREAST LUMPECTOMY;  Surgeon: [name], MD;  Location: [facility];  Service: General;  Laterality: Right;  . SKIN CANCER EXCISION Left 2013   upper arm, basal cell  . TONSILLECTOMY AND ADENOIDECTOMY     (DEVIATED SEPTUM  SAME TIME)  . TOTAL VAGINAL HYSTERECTOMY  12/1996   menorrhagia, fibroids    Current Outpatient Prescriptions  Medication Sig Dispense Refill  . acetaminophen (TYLENOL) 650 MG CR tablet Take 1,300 mg by mouth every 8 (eight) hours as needed for pain.    . Calcium Carb-Cholecalciferol (CALCIUM 600 + D) 600-200 MG-UNIT TABS Take 1 tablet by mouth 2 (two) times daily.     Marland Kitchen  cetirizine (ZYRTEC) 10 MG tablet Take 10 mg by mouth at bedtime. At 10 pm daily    . diclofenac (VOLTAREN) 75 MG EC tablet Take 75 mg by mouth 2 (two) times daily.    . diphenhydrAMINE (EQ NIGHTTIME SLEEP AID) 25 MG tablet Take 25 mg by mouth at bedtime as needed for sleep.    Marland Kitchen docusate sodium (COLACE) 100 MG capsule Take 1 capsule (100 mg total) by mouth 2 (two) times daily. 10 capsule 0  . fluticasone (FLONASE) 50 MCG/ACT nasal spray Place 1 spray into both nostrils daily.     . Glucos-Chond-Hyal Ac-Ca Fructo (MOVE FREE JOINT HEALTH  ADVANCE PO) Take 1 tablet by mouth daily.    Marland Kitchen HYDROcodone-acetaminophen (NORCO/VICODIN) 5-325 MG tablet Take 1-2 tablets by mouth every 6 (six) hours as needed for moderate pain (depends on pain level). 30 tablet 0  . Magnesium 250 MG TABS Take 250 mg by mouth 3 (three) times daily.     . magnesium hydroxide (MILK OF MAGNESIA) 400 MG/5ML suspension Take 5 mLs by mouth daily as needed for mild constipation.     . Multiple Vitamin (MULTIVITAMIN) tablet Take 1 tablet by mouth daily.    . Omega-3 Fatty Acids (FISH OIL) 1000 MG CAPS Take 1,000 mg by mouth 3 (three) times daily. Takes 1-3 times daily    . pravastatin (PRAVACHOL) 40 MG tablet Take 1 tablet (40 mg total) by mouth daily. (Patient taking differently: Take 40 mg by mouth every evening. At 10 pm) 30 tablet 3  . sertraline (ZOLOFT) 100 MG tablet Take 1 tablet (100 mg total) by mouth daily. 90 tablet 4  . sodium chloride (OCEAN) 0.65 % SOLN nasal spray Place 1 spray into both nostrils 2 (two) times daily as needed for congestion.    Marland Kitchen tetrahydrozoline 0.05 % ophthalmic solution Place 1 drop into both eyes daily as needed (dry eyes).    . Vitamin D, Ergocalciferol, (DRISDOL) 50000 units CAPS capsule Take 1 capsule (50,000 Units total) by mouth See admin instructions. Takes every other week on Friday 30 capsule 2  . vitamin E 400 UNIT capsule Take 400 Units by mouth 3 (three) times daily.     Marland Kitchen zinc gluconate 50 MG tablet Take 50 mg by mouth daily.     No current facility-administered medications for this visit.     Family History  Problem Relation Age of Onset  . Hypertension Mother   . Heart failure Mother   . Diabetes Mother   . Stroke Mother   . COPD Mother   . Hyperlipidemia Mother   . Hypertension Father   . Heart failure Father   . Hypertension Brother   . Hypertension Sister   . Hyperlipidemia Sister   . Hypertension Brother   . Hypertension Brother   . Diabetes Brother     ROS:  Pertinent items are noted in HPI.   Otherwise, a comprehensive ROS was negative.  Exam:   BP 116/74 (BP Location: Left Arm, Patient Position: Sitting, Cuff Size: Normal)   Pulse 68   Ht 5' 2.5" (1.588 m)   Wt 146 lb (66.2 kg)   LMP 12/12/1996 (Exact Date)   BMI 26.28 kg/m  Height: 5' 2.5" (158.8 cm) Ht Readings from Last 3 Encounters:  09/24/16 5' 2.5" (1.588 m)  09/22/16 5\' 3"  (1.6 m)  09/06/16 5\' 3"  (1.6 m)    General appearance: alert, cooperative and appears stated age Head: Normocephalic, without obvious abnormality, atraumatic Neck: no adenopathy, supple, symmetrical,  trachea midline and thyroid normal to inspection and palpation Lungs: clear to auscultation bilaterally Breasts: normal appearance, no masses or tenderness, on the left.  There are healing surgical changes on the right . Heart: regular rate and rhythm Abdomen: soft, non-tender; no masses,  no organomegaly Extremities: extremities normal, atraumatic, no cyanosis or edema Skin: Skin color, texture, turgor normal. No rashes or lesions Lymph nodes: Cervical, supraclavicular, and axillary nodes normal. No abnormal inguinal nodes palpated Neurologic: Grossly normal   Pelvic: External genitalia:  no lesions              Urethra:  normal appearing urethra with no masses, tenderness or lesions              Bartholin's and Skene's: normal                 Vagina: normal appearing vagina with normal color and discharge, no lesions              Cervix: absent              Pap taken: No. Bimanual Exam:  Uterus:  uterus absent              Adnexa: no mass, fullness, tenderness               Rectovaginal: Confirms               Anus:  normal sphincter tone, no lesions  Chaperone present: yes  A:  Well Woman with normal exam     S/P TVH secondary to fibroids 8/98 on ERT until 05/2016  New diagnosis of right breast cancer E/PR negative and HER2Nu positive  S/P right breast lumpectomy 06/02/16, revision 07/26/16 and 09/06/16 Right knee pain with  TKA on 06/10/16 Atrophic vaginitis  Vit D deficiency - taking every other week Osteopenia, hypercholesterolemia Situational anxiety and depression   P:   Reviewed health and wellness pertinent to exam  Pap smear: no  Mammogram is due 12/18  Refill Zoloft for a year  Did not repeat labs today for anything per her request  Counseled on breast self exam, mammography screening, adequate intake of calcium and vitamin D, diet and exercise return annually or prn  An After Visit Summary was printed and given to the patient.

## 2016-09-24 NOTE — Progress Notes (Signed)
Encounter reviewed by Dr. Brook Amundson C. Silva.  

## 2016-10-05 ENCOUNTER — Ambulatory Visit
Admission: RE | Admit: 2016-10-05 | Discharge: 2016-10-05 | Disposition: A | Discharge status: Home / Self Care | Payer: Medicare Other | Source: Ambulatory Visit | Attending: Radiation Oncology | Admitting: Radiation Oncology

## 2016-10-05 DIAGNOSIS — D0511 Intraductal carcinoma in situ of right breast: Secondary | ICD-10-CM

## 2016-10-05 DIAGNOSIS — C50211 Malignant neoplasm of upper-inner quadrant of right female breast: Secondary | ICD-10-CM | POA: Diagnosis not present

## 2016-10-05 DIAGNOSIS — Z880 Allergy status to penicillin: Secondary | ICD-10-CM | POA: Diagnosis not present

## 2016-10-05 DIAGNOSIS — C50911 Malignant neoplasm of unspecified site of right female breast: Secondary | ICD-10-CM | POA: Diagnosis not present

## 2016-10-05 DIAGNOSIS — Z888 Allergy status to other drugs, medicaments and biological substances status: Secondary | ICD-10-CM | POA: Diagnosis not present

## 2016-10-05 DIAGNOSIS — Z7901 Long term (current) use of anticoagulants: Secondary | ICD-10-CM | POA: Diagnosis not present

## 2016-10-06 NOTE — Progress Notes (Signed)
  Radiation Oncology         [phone] ________________________________  Name: Dawn West MRN: [id number]  Date: 10/05/2016  DOB: 06-Sep-1950  SIMULATION AND TREATMENT PLANNING NOTE   DIAGNOSIS:  Stage  (pT1a, pN0), grade 2 invasive ductal carcinoma of the right breast with high grade ductal carcinoma In Situ (ER negative, PR negative, HER2 positive)   NARRATIVE:  The patient was brought to the [facility].  Identity was confirmed.  All relevant records and images related to the planned course of therapy were reviewed.  The patient freely provided informed written consent to proceed with treatment after reviewing the details related to the planned course of therapy. The consent form was witnessed and verified by the simulation staff.  Then, the patient was set-up in a stable reproducible  supine position for radiation therapy.  CT images were obtained.  Surface markings were placed.  The CT images were loaded into the planning software.  Then the target and avoidance structures were contoured.  Treatment planning then occurred.  The radiation prescription was entered and confirmed.  Then, I designed and supervised the construction of a total of 3 medically necessary complex treatment devices.  I have requested : 3D Simulation  I have requested a DVH of the following structures: heart, lungs, lumpectomy cavity.  I have ordered:CBC  PLAN:  The patient will receive 50.4 Gy in 28 fractions followed by a boost to the lumpectomy cavity of 12 gray for a cumulative dose of 62.4 gray.  -----------------------------------  Blair Promise, PhD, MD

## 2016-10-07 DIAGNOSIS — D0511 Intraductal carcinoma in situ of right breast: Secondary | ICD-10-CM | POA: Diagnosis not present

## 2016-10-07 DIAGNOSIS — C50911 Malignant neoplasm of unspecified site of right female breast: Secondary | ICD-10-CM | POA: Diagnosis not present

## 2016-10-07 DIAGNOSIS — Z7901 Long term (current) use of anticoagulants: Secondary | ICD-10-CM | POA: Diagnosis not present

## 2016-10-07 DIAGNOSIS — Z888 Allergy status to other drugs, medicaments and biological substances status: Secondary | ICD-10-CM | POA: Diagnosis not present

## 2016-10-07 DIAGNOSIS — C50211 Malignant neoplasm of upper-inner quadrant of right female breast: Secondary | ICD-10-CM | POA: Diagnosis not present

## 2016-10-07 DIAGNOSIS — Z880 Allergy status to penicillin: Secondary | ICD-10-CM | POA: Diagnosis not present

## 2016-10-12 ENCOUNTER — Ambulatory Visit
Admission: RE | Admit: 2016-10-12 | Discharge: 2016-10-12 | Disposition: A | Discharge status: Home / Self Care | Payer: Medicare Other | Source: Ambulatory Visit | Attending: Radiation Oncology | Admitting: Radiation Oncology

## 2016-10-12 DIAGNOSIS — Z880 Allergy status to penicillin: Secondary | ICD-10-CM | POA: Diagnosis not present

## 2016-10-12 DIAGNOSIS — D0511 Intraductal carcinoma in situ of right breast: Secondary | ICD-10-CM

## 2016-10-12 DIAGNOSIS — Z888 Allergy status to other drugs, medicaments and biological substances status: Secondary | ICD-10-CM | POA: Diagnosis not present

## 2016-10-12 DIAGNOSIS — C50911 Malignant neoplasm of unspecified site of right female breast: Secondary | ICD-10-CM | POA: Diagnosis not present

## 2016-10-12 DIAGNOSIS — Z7901 Long term (current) use of anticoagulants: Secondary | ICD-10-CM | POA: Diagnosis not present

## 2016-10-12 DIAGNOSIS — C50211 Malignant neoplasm of upper-inner quadrant of right female breast: Secondary | ICD-10-CM | POA: Diagnosis not present

## 2016-10-12 NOTE — Progress Notes (Signed)
  Radiation Oncology         [phone] ________________________________  Name: Dawn West MRN: [id number]  Date: 10/12/2016  DOB: [birth year]  Simulation Verification Note  Stage (pT1a, pN0),grade 2 invasive ductal carcinoma of the right breast with high grade ductal carcinoma In Situ(ER negative, PR negative, HER2 positive)    Status: outpatient  NARRATIVE: The patient was brought to the treatment unit and placed in the planned treatment position. The clinical setup was verified. Then port films were obtained and uploaded to the radiation oncology medical record software.  The treatment beams were carefully compared against the planned radiation fields. The position location and shape of the radiation fields was reviewed. They targeted volume of tissue appears to be appropriately covered by the radiation beams. Organs at risk appear to be excluded as planned.  Based on my personal review, I approved the simulation verification. The patient's treatment will proceed as planned.  -----------------------------------  Blair Promise, PhD, MD

## 2016-10-13 ENCOUNTER — Ambulatory Visit
Admission: RE | Admit: 2016-10-13 | Discharge: 2016-10-13 | Disposition: A | Discharge status: Home / Self Care | Payer: Medicare Other | Source: Ambulatory Visit | Attending: Radiation Oncology | Admitting: Radiation Oncology

## 2016-10-13 DIAGNOSIS — C50911 Malignant neoplasm of unspecified site of right female breast: Secondary | ICD-10-CM | POA: Diagnosis not present

## 2016-10-13 DIAGNOSIS — D0511 Intraductal carcinoma in situ of right breast: Secondary | ICD-10-CM | POA: Diagnosis not present

## 2016-10-13 DIAGNOSIS — Z888 Allergy status to other drugs, medicaments and biological substances status: Secondary | ICD-10-CM | POA: Diagnosis not present

## 2016-10-13 DIAGNOSIS — Z7901 Long term (current) use of anticoagulants: Secondary | ICD-10-CM | POA: Diagnosis not present

## 2016-10-13 DIAGNOSIS — Z880 Allergy status to penicillin: Secondary | ICD-10-CM | POA: Diagnosis not present

## 2016-10-13 DIAGNOSIS — C50211 Malignant neoplasm of upper-inner quadrant of right female breast: Secondary | ICD-10-CM | POA: Diagnosis not present

## 2016-10-14 ENCOUNTER — Ambulatory Visit
Admission: RE | Admit: 2016-10-14 | Discharge: 2016-10-14 | Disposition: A | Discharge status: Home / Self Care | Payer: Medicare Other | Source: Ambulatory Visit | Attending: Radiation Oncology | Admitting: Radiation Oncology

## 2016-10-14 DIAGNOSIS — D0511 Intraductal carcinoma in situ of right breast: Secondary | ICD-10-CM | POA: Diagnosis not present

## 2016-10-14 DIAGNOSIS — Z880 Allergy status to penicillin: Secondary | ICD-10-CM | POA: Diagnosis not present

## 2016-10-14 DIAGNOSIS — C50211 Malignant neoplasm of upper-inner quadrant of right female breast: Secondary | ICD-10-CM | POA: Diagnosis not present

## 2016-10-14 DIAGNOSIS — C50911 Malignant neoplasm of unspecified site of right female breast: Secondary | ICD-10-CM | POA: Diagnosis not present

## 2016-10-14 DIAGNOSIS — Z888 Allergy status to other drugs, medicaments and biological substances status: Secondary | ICD-10-CM | POA: Diagnosis not present

## 2016-10-14 DIAGNOSIS — Z7901 Long term (current) use of anticoagulants: Secondary | ICD-10-CM | POA: Diagnosis not present

## 2016-10-15 ENCOUNTER — Ambulatory Visit
Admission: RE | Admit: 2016-10-15 | Discharge: 2016-10-15 | Disposition: A | Discharge status: Home / Self Care | Payer: Medicare Other | Source: Ambulatory Visit | Attending: Radiation Oncology | Admitting: Radiation Oncology

## 2016-10-15 DIAGNOSIS — C50911 Malignant neoplasm of unspecified site of right female breast: Secondary | ICD-10-CM | POA: Diagnosis not present

## 2016-10-15 DIAGNOSIS — Z7901 Long term (current) use of anticoagulants: Secondary | ICD-10-CM | POA: Diagnosis not present

## 2016-10-15 DIAGNOSIS — C50211 Malignant neoplasm of upper-inner quadrant of right female breast: Secondary | ICD-10-CM | POA: Diagnosis not present

## 2016-10-15 DIAGNOSIS — Z888 Allergy status to other drugs, medicaments and biological substances status: Secondary | ICD-10-CM | POA: Diagnosis not present

## 2016-10-15 DIAGNOSIS — D0511 Intraductal carcinoma in situ of right breast: Secondary | ICD-10-CM | POA: Diagnosis not present

## 2016-10-15 DIAGNOSIS — Z880 Allergy status to penicillin: Secondary | ICD-10-CM | POA: Diagnosis not present

## 2016-10-15 NOTE — Addendum Note (Signed)
Addendum  created 10/15/16 1109 by Lyn Hollingshead, MD   Sign clinical note

## 2016-10-18 ENCOUNTER — Ambulatory Visit
Admission: RE | Admit: 2016-10-18 | Discharge: 2016-10-18 | Disposition: A | Discharge status: Home / Self Care | Payer: Medicare Other | Source: Ambulatory Visit | Attending: Radiation Oncology | Admitting: Radiation Oncology

## 2016-10-18 DIAGNOSIS — Z888 Allergy status to other drugs, medicaments and biological substances status: Secondary | ICD-10-CM | POA: Diagnosis not present

## 2016-10-18 DIAGNOSIS — D0511 Intraductal carcinoma in situ of right breast: Secondary | ICD-10-CM

## 2016-10-18 DIAGNOSIS — C50911 Malignant neoplasm of unspecified site of right female breast: Secondary | ICD-10-CM | POA: Diagnosis not present

## 2016-10-18 DIAGNOSIS — Z7901 Long term (current) use of anticoagulants: Secondary | ICD-10-CM | POA: Diagnosis not present

## 2016-10-18 DIAGNOSIS — C50211 Malignant neoplasm of upper-inner quadrant of right female breast: Secondary | ICD-10-CM | POA: Diagnosis not present

## 2016-10-18 DIAGNOSIS — Z880 Allergy status to penicillin: Secondary | ICD-10-CM | POA: Diagnosis not present

## 2016-10-18 MED ORDER — RADIAPLEXRX EX GEL
Freq: Once | CUTANEOUS | Status: AC
  Administered 2016-10-18: 13:00:00 via TOPICAL

## 2016-10-18 MED ORDER — ALRA NON-METALLIC DEODORANT (RAD-ONC)
1.0000 "application " | Freq: Once | TOPICAL | Status: AC
  Administered 2016-10-18: 1 via TOPICAL

## 2016-10-18 NOTE — Progress Notes (Signed)
Patient education done, Dawn Putt RN business card, radiaplex gel and alra given, also flyer on skin care, apply skin products after rad txs and at bedtime, just nothing 4 hours prior to radiation, discussed skin irritation,pain, fatigue, luke warm showers, no rubbing ,scrubbing, or scratching breast  Area that is treated with radiation, pat dry, electric shaver only if shaving under axilla, no under wire bras preferably, dove soap unscented preferred, increase protein in diet and drink  Plenty fluids and water,,  sees MD weekly and Prn,  teach back given 12:43 PM

## 2016-10-19 ENCOUNTER — Ambulatory Visit: Payer: Medicare Other | Admitting: Radiation Oncology

## 2016-10-19 ENCOUNTER — Telehealth: Payer: Self-pay | Admitting: Hematology

## 2016-10-19 ENCOUNTER — Ambulatory Visit
Admission: RE | Admit: 2016-10-19 | Discharge: 2016-10-19 | Disposition: A | Discharge status: Home / Self Care | Payer: Medicare Other | Source: Ambulatory Visit | Attending: Radiation Oncology | Admitting: Radiation Oncology

## 2016-10-19 DIAGNOSIS — C50911 Malignant neoplasm of unspecified site of right female breast: Secondary | ICD-10-CM | POA: Diagnosis not present

## 2016-10-19 DIAGNOSIS — Z7901 Long term (current) use of anticoagulants: Secondary | ICD-10-CM | POA: Diagnosis not present

## 2016-10-19 DIAGNOSIS — C50211 Malignant neoplasm of upper-inner quadrant of right female breast: Secondary | ICD-10-CM | POA: Diagnosis not present

## 2016-10-19 DIAGNOSIS — Z880 Allergy status to penicillin: Secondary | ICD-10-CM | POA: Diagnosis not present

## 2016-10-19 DIAGNOSIS — D0511 Intraductal carcinoma in situ of right breast: Secondary | ICD-10-CM | POA: Diagnosis not present

## 2016-10-19 DIAGNOSIS — Z888 Allergy status to other drugs, medicaments and biological substances status: Secondary | ICD-10-CM | POA: Diagnosis not present

## 2016-10-19 NOTE — Progress Notes (Signed)
Dawn West completed 5th fraction of radiation.  Patient denies having any pain or discomfort in the breast.  Complains of chronic knee pain which is unrelated.  Patient denies having any appetite decrease.  Patient reports having some slight fatigue.  She was just given radiaplex gel yesterday and will begin to use today.  Skin to right breast is clean/dry/intact without any skin discoloration or pinkness.  Vitals:   10/19/16 1255  BP: 113/67  Pulse: 73  Resp: 18  Temp: 99.1 F (37.3 C)  TempSrc: Oral  SpO2: 97%  Weight: 147 lb 6.4 oz (66.9 kg)   Wt Readings from Last 3 Encounters:  10/19/16 147 lb 6.4 oz (66.9 kg)  09/24/16 146 lb (66.2 kg)  09/22/16 146 lb 6.4 oz (66.4 kg)

## 2016-10-19 NOTE — Telephone Encounter (Signed)
sw pt husband to inform of 7/23 appt at 3 pm per sch msg

## 2016-10-20 ENCOUNTER — Ambulatory Visit
Admission: RE | Admit: 2016-10-20 | Discharge: 2016-10-20 | Disposition: A | Discharge status: Home / Self Care | Payer: Medicare Other | Source: Ambulatory Visit | Attending: Radiation Oncology | Admitting: Radiation Oncology

## 2016-10-20 DIAGNOSIS — C50211 Malignant neoplasm of upper-inner quadrant of right female breast: Secondary | ICD-10-CM | POA: Diagnosis not present

## 2016-10-20 DIAGNOSIS — Z880 Allergy status to penicillin: Secondary | ICD-10-CM | POA: Diagnosis not present

## 2016-10-20 DIAGNOSIS — Z888 Allergy status to other drugs, medicaments and biological substances status: Secondary | ICD-10-CM | POA: Diagnosis not present

## 2016-10-20 DIAGNOSIS — D0511 Intraductal carcinoma in situ of right breast: Secondary | ICD-10-CM | POA: Diagnosis not present

## 2016-10-20 DIAGNOSIS — C50911 Malignant neoplasm of unspecified site of right female breast: Secondary | ICD-10-CM | POA: Diagnosis not present

## 2016-10-20 DIAGNOSIS — Z7901 Long term (current) use of anticoagulants: Secondary | ICD-10-CM | POA: Diagnosis not present

## 2016-10-21 ENCOUNTER — Ambulatory Visit
Admission: RE | Admit: 2016-10-21 | Discharge: 2016-10-21 | Disposition: A | Discharge status: Home / Self Care | Payer: Medicare Other | Source: Ambulatory Visit | Attending: Radiation Oncology | Admitting: Radiation Oncology

## 2016-10-21 DIAGNOSIS — D0511 Intraductal carcinoma in situ of right breast: Secondary | ICD-10-CM | POA: Diagnosis not present

## 2016-10-21 DIAGNOSIS — C50211 Malignant neoplasm of upper-inner quadrant of right female breast: Secondary | ICD-10-CM | POA: Diagnosis not present

## 2016-10-21 DIAGNOSIS — Z7901 Long term (current) use of anticoagulants: Secondary | ICD-10-CM | POA: Diagnosis not present

## 2016-10-21 DIAGNOSIS — C50911 Malignant neoplasm of unspecified site of right female breast: Secondary | ICD-10-CM | POA: Diagnosis not present

## 2016-10-21 DIAGNOSIS — Z888 Allergy status to other drugs, medicaments and biological substances status: Secondary | ICD-10-CM | POA: Diagnosis not present

## 2016-10-21 DIAGNOSIS — Z880 Allergy status to penicillin: Secondary | ICD-10-CM | POA: Diagnosis not present

## 2016-10-22 ENCOUNTER — Ambulatory Visit
Admission: RE | Admit: 2016-10-22 | Discharge: 2016-10-22 | Disposition: A | Discharge status: Home / Self Care | Payer: Medicare Other | Source: Ambulatory Visit | Attending: Radiation Oncology | Admitting: Radiation Oncology

## 2016-10-22 DIAGNOSIS — C50911 Malignant neoplasm of unspecified site of right female breast: Secondary | ICD-10-CM | POA: Diagnosis not present

## 2016-10-22 DIAGNOSIS — Z7901 Long term (current) use of anticoagulants: Secondary | ICD-10-CM | POA: Diagnosis not present

## 2016-10-22 DIAGNOSIS — C50211 Malignant neoplasm of upper-inner quadrant of right female breast: Secondary | ICD-10-CM | POA: Diagnosis not present

## 2016-10-22 DIAGNOSIS — Z888 Allergy status to other drugs, medicaments and biological substances status: Secondary | ICD-10-CM | POA: Diagnosis not present

## 2016-10-22 DIAGNOSIS — D0511 Intraductal carcinoma in situ of right breast: Secondary | ICD-10-CM | POA: Diagnosis not present

## 2016-10-22 DIAGNOSIS — Z880 Allergy status to penicillin: Secondary | ICD-10-CM | POA: Diagnosis not present

## 2016-10-25 ENCOUNTER — Ambulatory Visit
Admission: RE | Admit: 2016-10-25 | Discharge: 2016-10-25 | Disposition: A | Discharge status: Home / Self Care | Payer: Medicare Other | Source: Ambulatory Visit | Attending: Radiation Oncology | Admitting: Radiation Oncology

## 2016-10-25 DIAGNOSIS — Z7901 Long term (current) use of anticoagulants: Secondary | ICD-10-CM | POA: Diagnosis not present

## 2016-10-25 DIAGNOSIS — C50911 Malignant neoplasm of unspecified site of right female breast: Secondary | ICD-10-CM | POA: Diagnosis not present

## 2016-10-25 DIAGNOSIS — Z880 Allergy status to penicillin: Secondary | ICD-10-CM | POA: Diagnosis not present

## 2016-10-25 DIAGNOSIS — Z888 Allergy status to other drugs, medicaments and biological substances status: Secondary | ICD-10-CM | POA: Diagnosis not present

## 2016-10-25 DIAGNOSIS — D0511 Intraductal carcinoma in situ of right breast: Secondary | ICD-10-CM | POA: Diagnosis not present

## 2016-10-25 DIAGNOSIS — C50211 Malignant neoplasm of upper-inner quadrant of right female breast: Secondary | ICD-10-CM | POA: Diagnosis not present

## 2016-10-26 ENCOUNTER — Ambulatory Visit
Admission: RE | Admit: 2016-10-26 | Discharge: 2016-10-26 | Disposition: A | Discharge status: Home / Self Care | Payer: Medicare Other | Source: Ambulatory Visit | Attending: Radiation Oncology | Admitting: Radiation Oncology

## 2016-10-26 DIAGNOSIS — Z888 Allergy status to other drugs, medicaments and biological substances status: Secondary | ICD-10-CM | POA: Diagnosis not present

## 2016-10-26 DIAGNOSIS — Z7901 Long term (current) use of anticoagulants: Secondary | ICD-10-CM | POA: Diagnosis not present

## 2016-10-26 DIAGNOSIS — D0511 Intraductal carcinoma in situ of right breast: Secondary | ICD-10-CM | POA: Diagnosis not present

## 2016-10-26 DIAGNOSIS — C50911 Malignant neoplasm of unspecified site of right female breast: Secondary | ICD-10-CM | POA: Diagnosis not present

## 2016-10-26 DIAGNOSIS — Z880 Allergy status to penicillin: Secondary | ICD-10-CM | POA: Diagnosis not present

## 2016-10-26 DIAGNOSIS — C50211 Malignant neoplasm of upper-inner quadrant of right female breast: Secondary | ICD-10-CM | POA: Diagnosis not present

## 2016-10-27 ENCOUNTER — Ambulatory Visit
Admission: RE | Admit: 2016-10-27 | Discharge: 2016-10-27 | Disposition: A | Discharge status: Home / Self Care | Payer: Medicare Other | Source: Ambulatory Visit | Attending: Radiation Oncology | Admitting: Radiation Oncology

## 2016-10-27 DIAGNOSIS — C50211 Malignant neoplasm of upper-inner quadrant of right female breast: Secondary | ICD-10-CM | POA: Diagnosis not present

## 2016-10-27 DIAGNOSIS — Z888 Allergy status to other drugs, medicaments and biological substances status: Secondary | ICD-10-CM | POA: Diagnosis not present

## 2016-10-27 DIAGNOSIS — Z880 Allergy status to penicillin: Secondary | ICD-10-CM | POA: Diagnosis not present

## 2016-10-27 DIAGNOSIS — Z7901 Long term (current) use of anticoagulants: Secondary | ICD-10-CM | POA: Diagnosis not present

## 2016-10-27 DIAGNOSIS — C50911 Malignant neoplasm of unspecified site of right female breast: Secondary | ICD-10-CM | POA: Diagnosis not present

## 2016-10-27 DIAGNOSIS — D0511 Intraductal carcinoma in situ of right breast: Secondary | ICD-10-CM | POA: Diagnosis not present

## 2016-10-28 ENCOUNTER — Ambulatory Visit
Admission: RE | Admit: 2016-10-28 | Discharge: 2016-10-28 | Disposition: A | Discharge status: Home / Self Care | Payer: Medicare Other | Source: Ambulatory Visit | Attending: Radiation Oncology | Admitting: Radiation Oncology

## 2016-10-28 DIAGNOSIS — C50211 Malignant neoplasm of upper-inner quadrant of right female breast: Secondary | ICD-10-CM | POA: Diagnosis not present

## 2016-10-28 DIAGNOSIS — C50911 Malignant neoplasm of unspecified site of right female breast: Secondary | ICD-10-CM | POA: Diagnosis not present

## 2016-10-28 DIAGNOSIS — Z7901 Long term (current) use of anticoagulants: Secondary | ICD-10-CM | POA: Diagnosis not present

## 2016-10-28 DIAGNOSIS — D0511 Intraductal carcinoma in situ of right breast: Secondary | ICD-10-CM | POA: Diagnosis not present

## 2016-10-28 DIAGNOSIS — Z880 Allergy status to penicillin: Secondary | ICD-10-CM | POA: Diagnosis not present

## 2016-10-28 DIAGNOSIS — Z888 Allergy status to other drugs, medicaments and biological substances status: Secondary | ICD-10-CM | POA: Diagnosis not present

## 2016-10-29 ENCOUNTER — Ambulatory Visit
Admission: RE | Admit: 2016-10-29 | Discharge: 2016-10-29 | Disposition: A | Discharge status: Home / Self Care | Payer: Medicare Other | Source: Ambulatory Visit | Attending: Radiation Oncology | Admitting: Radiation Oncology

## 2016-10-29 DIAGNOSIS — D0511 Intraductal carcinoma in situ of right breast: Secondary | ICD-10-CM | POA: Diagnosis not present

## 2016-10-29 DIAGNOSIS — Z7901 Long term (current) use of anticoagulants: Secondary | ICD-10-CM | POA: Diagnosis not present

## 2016-10-29 DIAGNOSIS — C50211 Malignant neoplasm of upper-inner quadrant of right female breast: Secondary | ICD-10-CM | POA: Diagnosis not present

## 2016-10-29 DIAGNOSIS — Z888 Allergy status to other drugs, medicaments and biological substances status: Secondary | ICD-10-CM | POA: Diagnosis not present

## 2016-10-29 DIAGNOSIS — C50911 Malignant neoplasm of unspecified site of right female breast: Secondary | ICD-10-CM | POA: Diagnosis not present

## 2016-10-29 DIAGNOSIS — Z880 Allergy status to penicillin: Secondary | ICD-10-CM | POA: Diagnosis not present

## 2016-11-01 ENCOUNTER — Ambulatory Visit
Admission: RE | Admit: 2016-11-01 | Discharge: 2016-11-01 | Disposition: A | Discharge status: Home / Self Care | Payer: Medicare Other | Source: Ambulatory Visit | Attending: Radiation Oncology | Admitting: Radiation Oncology

## 2016-11-01 DIAGNOSIS — Z888 Allergy status to other drugs, medicaments and biological substances status: Secondary | ICD-10-CM | POA: Diagnosis not present

## 2016-11-01 DIAGNOSIS — C50211 Malignant neoplasm of upper-inner quadrant of right female breast: Secondary | ICD-10-CM | POA: Diagnosis not present

## 2016-11-01 DIAGNOSIS — D0511 Intraductal carcinoma in situ of right breast: Secondary | ICD-10-CM | POA: Diagnosis not present

## 2016-11-01 DIAGNOSIS — Z7901 Long term (current) use of anticoagulants: Secondary | ICD-10-CM | POA: Diagnosis not present

## 2016-11-01 DIAGNOSIS — Z880 Allergy status to penicillin: Secondary | ICD-10-CM | POA: Diagnosis not present

## 2016-11-01 DIAGNOSIS — C50911 Malignant neoplasm of unspecified site of right female breast: Secondary | ICD-10-CM | POA: Diagnosis not present

## 2016-11-02 ENCOUNTER — Ambulatory Visit
Admission: RE | Admit: 2016-11-02 | Discharge: 2016-11-02 | Disposition: A | Discharge status: Home / Self Care | Payer: Medicare Other | Source: Ambulatory Visit | Attending: Radiation Oncology | Admitting: Radiation Oncology

## 2016-11-02 DIAGNOSIS — Z7901 Long term (current) use of anticoagulants: Secondary | ICD-10-CM | POA: Diagnosis not present

## 2016-11-02 DIAGNOSIS — C50911 Malignant neoplasm of unspecified site of right female breast: Secondary | ICD-10-CM | POA: Diagnosis not present

## 2016-11-02 DIAGNOSIS — Z880 Allergy status to penicillin: Secondary | ICD-10-CM | POA: Diagnosis not present

## 2016-11-02 DIAGNOSIS — C50211 Malignant neoplasm of upper-inner quadrant of right female breast: Secondary | ICD-10-CM | POA: Diagnosis not present

## 2016-11-02 DIAGNOSIS — D0511 Intraductal carcinoma in situ of right breast: Secondary | ICD-10-CM | POA: Diagnosis not present

## 2016-11-02 DIAGNOSIS — Z888 Allergy status to other drugs, medicaments and biological substances status: Secondary | ICD-10-CM | POA: Diagnosis not present

## 2016-11-03 ENCOUNTER — Ambulatory Visit
Admission: RE | Admit: 2016-11-03 | Discharge: 2016-11-03 | Disposition: A | Discharge status: Home / Self Care | Payer: Medicare Other | Source: Ambulatory Visit | Attending: Radiation Oncology | Admitting: Radiation Oncology

## 2016-11-03 DIAGNOSIS — Z880 Allergy status to penicillin: Secondary | ICD-10-CM | POA: Diagnosis not present

## 2016-11-03 DIAGNOSIS — C50211 Malignant neoplasm of upper-inner quadrant of right female breast: Secondary | ICD-10-CM | POA: Diagnosis not present

## 2016-11-03 DIAGNOSIS — Z7901 Long term (current) use of anticoagulants: Secondary | ICD-10-CM | POA: Diagnosis not present

## 2016-11-03 DIAGNOSIS — C50911 Malignant neoplasm of unspecified site of right female breast: Secondary | ICD-10-CM | POA: Diagnosis not present

## 2016-11-03 DIAGNOSIS — D0511 Intraductal carcinoma in situ of right breast: Secondary | ICD-10-CM | POA: Diagnosis not present

## 2016-11-03 DIAGNOSIS — Z888 Allergy status to other drugs, medicaments and biological substances status: Secondary | ICD-10-CM | POA: Diagnosis not present

## 2016-11-04 ENCOUNTER — Ambulatory Visit
Admission: RE | Admit: 2016-11-04 | Discharge: 2016-11-04 | Disposition: A | Discharge status: Home / Self Care | Payer: Medicare Other | Source: Ambulatory Visit | Attending: Radiation Oncology | Admitting: Radiation Oncology

## 2016-11-04 DIAGNOSIS — Z880 Allergy status to penicillin: Secondary | ICD-10-CM | POA: Diagnosis not present

## 2016-11-04 DIAGNOSIS — C50911 Malignant neoplasm of unspecified site of right female breast: Secondary | ICD-10-CM | POA: Diagnosis not present

## 2016-11-04 DIAGNOSIS — C50211 Malignant neoplasm of upper-inner quadrant of right female breast: Secondary | ICD-10-CM | POA: Diagnosis not present

## 2016-11-04 DIAGNOSIS — Z7901 Long term (current) use of anticoagulants: Secondary | ICD-10-CM | POA: Diagnosis not present

## 2016-11-04 DIAGNOSIS — D0511 Intraductal carcinoma in situ of right breast: Secondary | ICD-10-CM | POA: Diagnosis not present

## 2016-11-04 DIAGNOSIS — Z888 Allergy status to other drugs, medicaments and biological substances status: Secondary | ICD-10-CM | POA: Diagnosis not present

## 2016-11-05 ENCOUNTER — Ambulatory Visit
Admission: RE | Admit: 2016-11-05 | Discharge: 2016-11-05 | Disposition: A | Discharge status: Home / Self Care | Payer: Medicare Other | Source: Ambulatory Visit | Attending: Radiation Oncology | Admitting: Radiation Oncology

## 2016-11-05 DIAGNOSIS — D0511 Intraductal carcinoma in situ of right breast: Secondary | ICD-10-CM | POA: Diagnosis not present

## 2016-11-05 DIAGNOSIS — Z880 Allergy status to penicillin: Secondary | ICD-10-CM | POA: Diagnosis not present

## 2016-11-05 DIAGNOSIS — Z888 Allergy status to other drugs, medicaments and biological substances status: Secondary | ICD-10-CM | POA: Diagnosis not present

## 2016-11-05 DIAGNOSIS — C50911 Malignant neoplasm of unspecified site of right female breast: Secondary | ICD-10-CM | POA: Diagnosis not present

## 2016-11-05 DIAGNOSIS — C50211 Malignant neoplasm of upper-inner quadrant of right female breast: Secondary | ICD-10-CM | POA: Diagnosis not present

## 2016-11-05 DIAGNOSIS — Z7901 Long term (current) use of anticoagulants: Secondary | ICD-10-CM | POA: Diagnosis not present

## 2016-11-08 ENCOUNTER — Ambulatory Visit
Admission: RE | Admit: 2016-11-08 | Discharge: 2016-11-08 | Disposition: A | Discharge status: Home / Self Care | Payer: Medicare Other | Source: Ambulatory Visit | Attending: Radiation Oncology | Admitting: Radiation Oncology

## 2016-11-08 DIAGNOSIS — C50911 Malignant neoplasm of unspecified site of right female breast: Secondary | ICD-10-CM | POA: Diagnosis not present

## 2016-11-08 DIAGNOSIS — Z888 Allergy status to other drugs, medicaments and biological substances status: Secondary | ICD-10-CM | POA: Diagnosis not present

## 2016-11-08 DIAGNOSIS — C50211 Malignant neoplasm of upper-inner quadrant of right female breast: Secondary | ICD-10-CM | POA: Diagnosis not present

## 2016-11-08 DIAGNOSIS — D0511 Intraductal carcinoma in situ of right breast: Secondary | ICD-10-CM | POA: Diagnosis not present

## 2016-11-08 DIAGNOSIS — Z7901 Long term (current) use of anticoagulants: Secondary | ICD-10-CM | POA: Diagnosis not present

## 2016-11-08 DIAGNOSIS — Z880 Allergy status to penicillin: Secondary | ICD-10-CM | POA: Diagnosis not present

## 2016-11-09 ENCOUNTER — Ambulatory Visit
Admission: RE | Admit: 2016-11-09 | Discharge: 2016-11-09 | Disposition: A | Discharge status: Home / Self Care | Payer: Medicare Other | Source: Ambulatory Visit | Attending: Radiation Oncology | Admitting: Radiation Oncology

## 2016-11-09 DIAGNOSIS — D0511 Intraductal carcinoma in situ of right breast: Secondary | ICD-10-CM | POA: Diagnosis not present

## 2016-11-09 DIAGNOSIS — Z880 Allergy status to penicillin: Secondary | ICD-10-CM | POA: Diagnosis not present

## 2016-11-09 DIAGNOSIS — Z7901 Long term (current) use of anticoagulants: Secondary | ICD-10-CM | POA: Diagnosis not present

## 2016-11-09 DIAGNOSIS — C50211 Malignant neoplasm of upper-inner quadrant of right female breast: Secondary | ICD-10-CM | POA: Diagnosis not present

## 2016-11-09 DIAGNOSIS — Z888 Allergy status to other drugs, medicaments and biological substances status: Secondary | ICD-10-CM | POA: Diagnosis not present

## 2016-11-09 DIAGNOSIS — C50911 Malignant neoplasm of unspecified site of right female breast: Secondary | ICD-10-CM | POA: Diagnosis not present

## 2016-11-09 MED ORDER — SONAFINE EX EMUL
1.0000 "application " | Freq: Once | CUTANEOUS | Status: AC
  Administered 2016-11-09: 1 via TOPICAL

## 2016-11-11 ENCOUNTER — Ambulatory Visit
Admission: RE | Admit: 2016-11-11 | Discharge: 2016-11-11 | Disposition: A | Discharge status: Home / Self Care | Payer: Medicare Other | Source: Ambulatory Visit | Attending: Radiation Oncology | Admitting: Radiation Oncology

## 2016-11-11 DIAGNOSIS — Z880 Allergy status to penicillin: Secondary | ICD-10-CM | POA: Diagnosis not present

## 2016-11-11 DIAGNOSIS — Z7901 Long term (current) use of anticoagulants: Secondary | ICD-10-CM | POA: Diagnosis not present

## 2016-11-11 DIAGNOSIS — C50211 Malignant neoplasm of upper-inner quadrant of right female breast: Secondary | ICD-10-CM | POA: Diagnosis not present

## 2016-11-11 DIAGNOSIS — C50911 Malignant neoplasm of unspecified site of right female breast: Secondary | ICD-10-CM | POA: Diagnosis not present

## 2016-11-11 DIAGNOSIS — D0511 Intraductal carcinoma in situ of right breast: Secondary | ICD-10-CM | POA: Diagnosis not present

## 2016-11-11 DIAGNOSIS — Z888 Allergy status to other drugs, medicaments and biological substances status: Secondary | ICD-10-CM | POA: Diagnosis not present

## 2016-11-12 ENCOUNTER — Ambulatory Visit
Admission: RE | Admit: 2016-11-12 | Discharge: 2016-11-12 | Disposition: A | Discharge status: Home / Self Care | Payer: Medicare Other | Source: Ambulatory Visit | Attending: Radiation Oncology | Admitting: Radiation Oncology

## 2016-11-12 DIAGNOSIS — C50911 Malignant neoplasm of unspecified site of right female breast: Secondary | ICD-10-CM | POA: Diagnosis not present

## 2016-11-12 DIAGNOSIS — C50211 Malignant neoplasm of upper-inner quadrant of right female breast: Secondary | ICD-10-CM | POA: Diagnosis not present

## 2016-11-12 DIAGNOSIS — D0511 Intraductal carcinoma in situ of right breast: Secondary | ICD-10-CM | POA: Diagnosis not present

## 2016-11-12 DIAGNOSIS — Z7901 Long term (current) use of anticoagulants: Secondary | ICD-10-CM | POA: Diagnosis not present

## 2016-11-12 DIAGNOSIS — Z888 Allergy status to other drugs, medicaments and biological substances status: Secondary | ICD-10-CM | POA: Diagnosis not present

## 2016-11-12 DIAGNOSIS — Z880 Allergy status to penicillin: Secondary | ICD-10-CM | POA: Diagnosis not present

## 2016-11-15 ENCOUNTER — Ambulatory Visit
Admission: RE | Admit: 2016-11-15 | Discharge: 2016-11-15 | Disposition: A | Discharge status: Home / Self Care | Payer: Medicare Other | Source: Ambulatory Visit | Attending: Radiation Oncology | Admitting: Radiation Oncology

## 2016-11-15 DIAGNOSIS — Z880 Allergy status to penicillin: Secondary | ICD-10-CM | POA: Diagnosis not present

## 2016-11-15 DIAGNOSIS — D0511 Intraductal carcinoma in situ of right breast: Secondary | ICD-10-CM | POA: Diagnosis not present

## 2016-11-15 DIAGNOSIS — Z888 Allergy status to other drugs, medicaments and biological substances status: Secondary | ICD-10-CM | POA: Diagnosis not present

## 2016-11-15 DIAGNOSIS — C50911 Malignant neoplasm of unspecified site of right female breast: Secondary | ICD-10-CM | POA: Diagnosis not present

## 2016-11-15 DIAGNOSIS — Z7901 Long term (current) use of anticoagulants: Secondary | ICD-10-CM | POA: Diagnosis not present

## 2016-11-15 DIAGNOSIS — C50211 Malignant neoplasm of upper-inner quadrant of right female breast: Secondary | ICD-10-CM | POA: Diagnosis not present

## 2016-11-16 ENCOUNTER — Ambulatory Visit
Admission: RE | Admit: 2016-11-16 | Discharge: 2016-11-16 | Disposition: A | Discharge status: Home / Self Care | Payer: Medicare Other | Source: Ambulatory Visit | Attending: Radiation Oncology | Admitting: Radiation Oncology

## 2016-11-16 DIAGNOSIS — D0511 Intraductal carcinoma in situ of right breast: Secondary | ICD-10-CM | POA: Diagnosis not present

## 2016-11-16 DIAGNOSIS — Z880 Allergy status to penicillin: Secondary | ICD-10-CM | POA: Diagnosis not present

## 2016-11-16 DIAGNOSIS — Z7901 Long term (current) use of anticoagulants: Secondary | ICD-10-CM | POA: Diagnosis not present

## 2016-11-16 DIAGNOSIS — Z888 Allergy status to other drugs, medicaments and biological substances status: Secondary | ICD-10-CM | POA: Diagnosis not present

## 2016-11-16 DIAGNOSIS — C50211 Malignant neoplasm of upper-inner quadrant of right female breast: Secondary | ICD-10-CM | POA: Diagnosis not present

## 2016-11-16 DIAGNOSIS — C50911 Malignant neoplasm of unspecified site of right female breast: Secondary | ICD-10-CM | POA: Diagnosis not present

## 2016-11-17 ENCOUNTER — Ambulatory Visit
Admission: RE | Admit: 2016-11-17 | Discharge: 2016-11-17 | Disposition: A | Discharge status: Home / Self Care | Payer: Medicare Other | Source: Ambulatory Visit | Attending: Radiation Oncology | Admitting: Radiation Oncology

## 2016-11-17 DIAGNOSIS — Z888 Allergy status to other drugs, medicaments and biological substances status: Secondary | ICD-10-CM | POA: Diagnosis not present

## 2016-11-17 DIAGNOSIS — D0511 Intraductal carcinoma in situ of right breast: Secondary | ICD-10-CM | POA: Diagnosis not present

## 2016-11-17 DIAGNOSIS — C50911 Malignant neoplasm of unspecified site of right female breast: Secondary | ICD-10-CM | POA: Diagnosis not present

## 2016-11-17 DIAGNOSIS — C50211 Malignant neoplasm of upper-inner quadrant of right female breast: Secondary | ICD-10-CM | POA: Diagnosis not present

## 2016-11-17 DIAGNOSIS — Z880 Allergy status to penicillin: Secondary | ICD-10-CM | POA: Diagnosis not present

## 2016-11-17 DIAGNOSIS — Z7901 Long term (current) use of anticoagulants: Secondary | ICD-10-CM | POA: Diagnosis not present

## 2016-11-18 ENCOUNTER — Ambulatory Visit
Admission: RE | Admit: 2016-11-18 | Discharge: 2016-11-18 | Disposition: A | Discharge status: Home / Self Care | Payer: Medicare Other | Source: Ambulatory Visit | Attending: Radiation Oncology | Admitting: Radiation Oncology

## 2016-11-18 DIAGNOSIS — D0511 Intraductal carcinoma in situ of right breast: Secondary | ICD-10-CM | POA: Diagnosis not present

## 2016-11-18 DIAGNOSIS — Z7901 Long term (current) use of anticoagulants: Secondary | ICD-10-CM | POA: Diagnosis not present

## 2016-11-18 DIAGNOSIS — C50211 Malignant neoplasm of upper-inner quadrant of right female breast: Secondary | ICD-10-CM | POA: Diagnosis not present

## 2016-11-18 DIAGNOSIS — Z880 Allergy status to penicillin: Secondary | ICD-10-CM | POA: Diagnosis not present

## 2016-11-18 DIAGNOSIS — C50911 Malignant neoplasm of unspecified site of right female breast: Secondary | ICD-10-CM | POA: Diagnosis not present

## 2016-11-18 DIAGNOSIS — Z888 Allergy status to other drugs, medicaments and biological substances status: Secondary | ICD-10-CM | POA: Diagnosis not present

## 2016-11-19 ENCOUNTER — Ambulatory Visit
Admission: RE | Admit: 2016-11-19 | Discharge: 2016-11-19 | Disposition: A | Discharge status: Home / Self Care | Payer: Medicare Other | Source: Ambulatory Visit | Attending: Radiation Oncology | Admitting: Radiation Oncology

## 2016-11-19 DIAGNOSIS — D0511 Intraductal carcinoma in situ of right breast: Secondary | ICD-10-CM | POA: Diagnosis not present

## 2016-11-19 DIAGNOSIS — Z888 Allergy status to other drugs, medicaments and biological substances status: Secondary | ICD-10-CM | POA: Diagnosis not present

## 2016-11-19 DIAGNOSIS — Z7901 Long term (current) use of anticoagulants: Secondary | ICD-10-CM | POA: Diagnosis not present

## 2016-11-19 DIAGNOSIS — C50211 Malignant neoplasm of upper-inner quadrant of right female breast: Secondary | ICD-10-CM | POA: Diagnosis not present

## 2016-11-19 DIAGNOSIS — Z880 Allergy status to penicillin: Secondary | ICD-10-CM | POA: Diagnosis not present

## 2016-11-19 DIAGNOSIS — C50911 Malignant neoplasm of unspecified site of right female breast: Secondary | ICD-10-CM | POA: Diagnosis not present

## 2016-11-22 ENCOUNTER — Ambulatory Visit
Admission: RE | Admit: 2016-11-22 | Discharge: 2016-11-22 | Disposition: A | Discharge status: Home / Self Care | Payer: Medicare Other | Source: Ambulatory Visit | Attending: Radiation Oncology | Admitting: Radiation Oncology

## 2016-11-22 DIAGNOSIS — D0511 Intraductal carcinoma in situ of right breast: Secondary | ICD-10-CM | POA: Diagnosis not present

## 2016-11-22 DIAGNOSIS — Z880 Allergy status to penicillin: Secondary | ICD-10-CM | POA: Diagnosis not present

## 2016-11-22 DIAGNOSIS — Z7901 Long term (current) use of anticoagulants: Secondary | ICD-10-CM | POA: Diagnosis not present

## 2016-11-22 DIAGNOSIS — C50211 Malignant neoplasm of upper-inner quadrant of right female breast: Secondary | ICD-10-CM | POA: Diagnosis not present

## 2016-11-22 DIAGNOSIS — C50911 Malignant neoplasm of unspecified site of right female breast: Secondary | ICD-10-CM | POA: Diagnosis not present

## 2016-11-22 DIAGNOSIS — Z888 Allergy status to other drugs, medicaments and biological substances status: Secondary | ICD-10-CM | POA: Diagnosis not present

## 2016-11-23 ENCOUNTER — Ambulatory Visit
Admission: RE | Admit: 2016-11-23 | Discharge: 2016-11-23 | Disposition: A | Discharge status: Home / Self Care | Payer: Medicare Other | Source: Ambulatory Visit | Attending: Radiation Oncology | Admitting: Radiation Oncology

## 2016-11-23 DIAGNOSIS — Z888 Allergy status to other drugs, medicaments and biological substances status: Secondary | ICD-10-CM | POA: Diagnosis not present

## 2016-11-23 DIAGNOSIS — C50911 Malignant neoplasm of unspecified site of right female breast: Secondary | ICD-10-CM | POA: Diagnosis not present

## 2016-11-23 DIAGNOSIS — C50211 Malignant neoplasm of upper-inner quadrant of right female breast: Secondary | ICD-10-CM | POA: Diagnosis not present

## 2016-11-23 DIAGNOSIS — Z880 Allergy status to penicillin: Secondary | ICD-10-CM | POA: Diagnosis not present

## 2016-11-23 DIAGNOSIS — Z7901 Long term (current) use of anticoagulants: Secondary | ICD-10-CM | POA: Diagnosis not present

## 2016-11-23 DIAGNOSIS — D0511 Intraductal carcinoma in situ of right breast: Secondary | ICD-10-CM | POA: Diagnosis not present

## 2016-11-24 ENCOUNTER — Ambulatory Visit
Admission: RE | Admit: 2016-11-24 | Discharge: 2016-11-24 | Disposition: A | Discharge status: Home / Self Care | Payer: Medicare Other | Source: Ambulatory Visit | Attending: Radiation Oncology | Admitting: Radiation Oncology

## 2016-11-24 DIAGNOSIS — Z888 Allergy status to other drugs, medicaments and biological substances status: Secondary | ICD-10-CM | POA: Diagnosis not present

## 2016-11-24 DIAGNOSIS — C50911 Malignant neoplasm of unspecified site of right female breast: Secondary | ICD-10-CM | POA: Diagnosis not present

## 2016-11-24 DIAGNOSIS — Z880 Allergy status to penicillin: Secondary | ICD-10-CM | POA: Diagnosis not present

## 2016-11-24 DIAGNOSIS — C50211 Malignant neoplasm of upper-inner quadrant of right female breast: Secondary | ICD-10-CM | POA: Diagnosis not present

## 2016-11-24 DIAGNOSIS — Z7901 Long term (current) use of anticoagulants: Secondary | ICD-10-CM | POA: Diagnosis not present

## 2016-11-24 DIAGNOSIS — D0511 Intraductal carcinoma in situ of right breast: Secondary | ICD-10-CM | POA: Diagnosis not present

## 2016-11-25 ENCOUNTER — Ambulatory Visit
Admission: RE | Admit: 2016-11-25 | Discharge: 2016-11-25 | Disposition: A | Discharge status: Home / Self Care | Payer: Medicare Other | Source: Ambulatory Visit | Attending: Radiation Oncology | Admitting: Radiation Oncology

## 2016-11-25 DIAGNOSIS — D0511 Intraductal carcinoma in situ of right breast: Secondary | ICD-10-CM | POA: Diagnosis not present

## 2016-11-25 DIAGNOSIS — C50211 Malignant neoplasm of upper-inner quadrant of right female breast: Secondary | ICD-10-CM | POA: Diagnosis not present

## 2016-11-25 DIAGNOSIS — Z888 Allergy status to other drugs, medicaments and biological substances status: Secondary | ICD-10-CM | POA: Diagnosis not present

## 2016-11-25 DIAGNOSIS — Z7901 Long term (current) use of anticoagulants: Secondary | ICD-10-CM | POA: Diagnosis not present

## 2016-11-25 DIAGNOSIS — C50911 Malignant neoplasm of unspecified site of right female breast: Secondary | ICD-10-CM | POA: Diagnosis not present

## 2016-11-25 DIAGNOSIS — Z880 Allergy status to penicillin: Secondary | ICD-10-CM | POA: Diagnosis not present

## 2016-11-26 ENCOUNTER — Ambulatory Visit
Admission: RE | Admit: 2016-11-26 | Discharge: 2016-11-26 | Disposition: A | Discharge status: Home / Self Care | Payer: Medicare Other | Source: Ambulatory Visit | Attending: Radiation Oncology | Admitting: Radiation Oncology

## 2016-11-26 DIAGNOSIS — C50211 Malignant neoplasm of upper-inner quadrant of right female breast: Secondary | ICD-10-CM | POA: Diagnosis not present

## 2016-11-26 DIAGNOSIS — D0511 Intraductal carcinoma in situ of right breast: Secondary | ICD-10-CM | POA: Diagnosis not present

## 2016-11-26 DIAGNOSIS — C50911 Malignant neoplasm of unspecified site of right female breast: Secondary | ICD-10-CM | POA: Diagnosis not present

## 2016-11-26 DIAGNOSIS — Z880 Allergy status to penicillin: Secondary | ICD-10-CM | POA: Diagnosis not present

## 2016-11-26 DIAGNOSIS — Z888 Allergy status to other drugs, medicaments and biological substances status: Secondary | ICD-10-CM | POA: Diagnosis not present

## 2016-11-26 DIAGNOSIS — Z7901 Long term (current) use of anticoagulants: Secondary | ICD-10-CM | POA: Diagnosis not present

## 2016-11-26 NOTE — Progress Notes (Signed)
[place]  [phone] [phone]  Clinic Follow-up Note   Patient Care Team: Pa, [place] as PCP - General (Family Medicine) Kem Boroughs, [place] as Nurse Practitioner (Family Medicine) [name], MD as Consulting Physician (General Surgery) Truitt Merle, MD as Consulting Physician (Hematology) Gery Pray, MD as Consulting Physician (Radiation Oncology) 11/29/2016  CHIEF COMPLAINTS:  Follow up right breast cancer stage IA  Oncology History   Cancer Staging Breast cancer of upper-outer quadrant of right female breast [facility]) Staging form: Breast, AJCC 8th Edition - Clinical: Stage 0 (cTis (DCIS), cN0, cM0, ER: Negative, PR: Negative) - Signed by Truitt Merle, MD on 05/25/2016 - Pathologic stage from 06/02/2016: Stage IA (pT1a, pN0, cM0, G2, ER: Negative, PR: Negative, HER2: Positive) - Signed by Truitt Merle, MD on 11/29/2016       Breast cancer of upper-outer quadrant of right female breast ([place])   05/18/2016 Mammogram    Diagnostic mammogram of the right breast showed heterogeneous calcifications in a linear orientation within the posterior upper right breast and spanning a distance of 2.2 cm. No associated mass identified.      05/19/2016 Initial Biopsy    Right breast core needle biopsy showed ductal carcinoma with calcification, high-grade, focal highly suspicious for stromal invasion.      05/19/2016 Receptors her2    ER negative, PR negative      05/19/2016 Initial Diagnosis    Ductal carcinoma in situ (DCIS) of right breast      06/02/2016 Surgery    RIGHT BREAST LUMPECTOMY WITH RADIOACTIVE SEED AND SENTINEL LYMPH NODE BIOPSY by Dr. Barry Dienes      06/02/2016 Pathology Results    Diagnosis 06/02/16 1. Breast, lumpectomy, Right w/seed - INVASIVE DUCTAL CARCINOMA, GRADE 2, SPANNING 0.3 CM. - HIGH GRADE DUCTAL CARCINOMA IN SITU WITH NECROSIS. - INVASIVE CARCINOMA COMES TO WITHIN <0.1 CM OF THE POSTERIOR MARGIN  FOCALLY. - DUCTAL CARCINOMA IN SITU COMES TO WITHIN <0.1 CM OF THE POSTERIOR MARGIN FOCALLY AND THE SUPERIOR MARGIN FOCALLY. - BIOPSY SITE. - SEE ONCOLOGY TABLE. 2. Breast, excision, Right additional Medial Margin - FIBROCYSTIC CHANGE. - USUAL DUCTAL HYPERPLASIA. - NO MALIGNANCY IDENTIFIED. 3. Lymph node, sentinel, biopsy, Right Axillary #1 - ONE OF ONE LYMPH NODES NEGATIVE FOR CARCINOMA (0/1). 4. Lymph node, sentinel, biopsy, Right Axillary - ONE OF ONE LYMPH NODES NEGATIVE FOR CARCINOMA (0/1).      06/02/2016 Receptors her2    ER-, PR-, HER2+       07/26/2016 Surgery    RE-EXCISION OF RIGHT BREAST LUMPECTOMY by Dr. Barry Dienes      07/26/2016 Pathology Results    Diagnosis 07/26/16 Breast, excision, Right new Superior Margin - DUCTAL CARCINOMA IN SITU WITH CALCIFICATIONS, HIGH GRADE, SPANNING AT LEAST 1.5 CM. - FOCI HIGHLY SUSPICIOUS FOR EARLY STROMAL INVASION. - DUCTAL CARCINOMA IN SITU IS BROADLY LESS THAN 0.1 CM TO THE NEW SUPERIOR MARGIN. - SEE COMMENT.      08/26/2016 Pathology Results    Diagnosis 08/26/16 Breast, right, needle core biopsy, superior anterior - HIGH GRADE, DUCTAL CARCINOMA IN SITU - SEE COMMENT      09/06/2016 Surgery    RE-EXCISION OF RIGHT BREAST LUMPECTOMY WITH RADIOACTIVE SEED LOCALIZATION by Dr. Barry Dienes      09/06/2016 Pathology Results    Diagnosis 09/06/16 1. Breast, excision, Right Superior Margin - INVASIVE DUCTAL CARCINOMA, GRADE III/III, SCATTERED MICROSCOPIC FOCI. - DUCTAL CARCINOMA IN SITU WITH CALCIFICATIONS, HIGH GRADE. - INVASIVE CARCINOMA IS FOCALLY LESS THAN 0.1 CM  TO THE INFERIOR MARGIN OF SPECIMEN #1.. - DUCTAL CARCINOMA IN SITU IS BROADLY LESS THAN 0.1 CM TO THE LATERAL MARGIN, FOCALLY LESS THAN 0.1 CM TO THE INFERIOR MARGIN, AND FOCALLY LESS THAN 0.1 CM TO THE ANTERIOR MARGIN OF SPECIMEN #1. - SEE COMMENT. 2. Breast, excision, Right Superolateral Margin - FIBROCYSTIC CHANGES. - THERE IS NO EVIDENCE OF MALIGNANCY. - SEE COMMENT.        10/13/2016 -  Radiation Therapy    Radiation by Dr. Roselind Messier        HISTORY OF PRESENTING ILLNESS: 05/26/16 Vickey Sages 66 y.o. female is here because of Her recently diagnosed right breast DCIS. She presents to our multidisciplinary breast clinic with her husband today.  This was discovered by screening mammogram, she is in no palpable breast mass, and denies any new symptoms lately. Her diagnostic mammogram on 10/16/2016 showed heterogeneous calcification in the right breast spanning 2.2 cm. Core needle biopsy of the right breast lesion showed high-grade DCIS, focally suspicious for stromal invasion. ER/PR negative.   She has chronic bilateral knee pain from arthritis for the past 3 years, he has been getting much worse since a month ago. She is scheduled to have a total left knee replacement on 06/10/2016, she walks with a walker. No other arthritis or other complaints.  GYN HISTORY  Menarchal:12 LMP: age of 42 before hysterectomy  Contraceptive: 20 yars  HRT: 20 years, will stop now  G1P1: 10 yo daughter     CURRENT THERAPY:  Radiation to be completed 11/30/16 Surveillance    INTERVAL HISTORY:  DORE OQUIN is here for a follow up. She presents to the clinic today with her husband. She reports to recovering well from all her breast surgeries as well as her left knee replacement from February. She will need to go back to get her right knee replaced. She is no longer using a cane. She has redness from radiation. She has been using neosporin around the wounds. Tomorrow is her last treatment. A week later the redness goes away.  She wants to know how long the radiation will remain in her cells and if it will effect her next knee surgery.   Her energy is normal. She has not slowed down.  Sleeping is her issue. She has hot flashes but they are not keeping her awake. The sleeping she thinks it is due to not being on hormones.    MEDICAL HISTORY:  Past Medical History:  Diagnosis  Date  . Anxiety   . Arthritis    knees  . Cancer (HCC) 12/01/11   basal cell skin ca Left upper arm  . Cancer (HCC) 04/20/2016   right breast cancer  . Complication of anesthesia   . Depression   . Headache   . Hypercholesteremia   . PONV (postoperative nausea and vomiting)     SURGICAL HISTORY: Past Surgical History:  Procedure Laterality Date  . ABDOMINAL HYSTERECTOMY    . BREAST LUMPECTOMY WITH RADIOACTIVE SEED AND SENTINEL LYMPH NODE BIOPSY Right 06/02/2016   Procedure: RIGHT BREAST LUMPECTOMY WITH RADIOACTIVE SEED AND SENTINEL LYMPH NODE BIOPSY;  Surgeon: Almond Lint, MD;  Location: [facility];  Service: General;  Laterality: Right;  . BREAST LUMPECTOMY WITH RADIOACTIVE SEED LOCALIZATION Right 09/06/2016   Procedure: RE-EXCISION OF RIGHT BREAST LUMPECTOMY WITH RADIOACTIVE SEED LOCALIZATION;  Surgeon: Almond Lint, MD;  Location: [facility];  Service: General;  Laterality: Right;  . BREAST SURGERY  05/19/2016   right breast  biopsy  . KNEE ARTHROPLASTY Left 06/10/2016   Procedure: LEFT TOTAL KNEE ARTHROPLASTY WITH COMPUTER NAVIGATION;  Surgeon: Rod Can, MD;  Location: [facility];  Service: Orthopedics;  Laterality: Left;  . MOUTH SURGERY    . RE-EXCISION OF BREAST LUMPECTOMY Right 07/26/2016   Procedure: RE-EXCISION OF RIGHT BREAST LUMPECTOMY;  Surgeon: [name], MD;  Location: [facility];  Service: General;  Laterality: Right;  . SKIN CANCER EXCISION Left 2013   upper arm, basal cell  . TONSILLECTOMY AND ADENOIDECTOMY     (DEVIATED SEPTUM  SAME TIME)  . TOTAL VAGINAL HYSTERECTOMY  12/1996   menorrhagia, fibroids    SOCIAL HISTORY: Social History   Social History  . Marital status: Married    Spouse name: N/A  . Number of children: 1  . Years of education: N/A   Occupational History  . Not on file.   Social History Main Topics  . Smoking status: Never Smoker  . Smokeless tobacco: Never Used  . Alcohol use 0.0 oz/week     Comment: 3  times yearly  . Drug use: No  . Sexual activity: Yes    Partners: Male    Birth control/ protection: Post-menopausal, Surgical     Comment: Hysterectomy   Other Topics Concern  . Not on file   Social History Narrative  . No narrative on file    FAMILY HISTORY: Family History  Problem Relation Age of Onset  . Hypertension Mother   . Heart failure Mother   . Diabetes Mother   . Stroke Mother   . COPD Mother   . Hyperlipidemia Mother   . Hypertension Father   . Heart failure Father   . Hypertension Brother   . Hypertension Sister   . Hyperlipidemia Sister   . Hypertension Brother   . Hypertension Brother   . Diabetes Brother     ALLERGIES:  is allergic to allegra [fexofenadine hcl]; ciprofloxacin; estradiol; oxycodone; penicillins; and sulfa antibiotics.  MEDICATIONS:  Current Outpatient Prescriptions  Medication Sig Dispense Refill  . acetaminophen (TYLENOL) 650 MG CR tablet Take 1,300 mg by mouth every 8 (eight) hours as needed for pain.    . Calcium Carb-Cholecalciferol (CALCIUM 600 + D) 600-200 MG-UNIT TABS Take 1 tablet by mouth 2 (two) times daily.     . cetirizine (ZYRTEC) 10 MG tablet Take 10 mg by mouth at bedtime. At 10 pm daily    . diclofenac (VOLTAREN) 75 MG EC tablet Take 75 mg by mouth 2 (two) times daily.    . diphenhydrAMINE (EQ NIGHTTIME SLEEP AID) 25 MG tablet Take 25 mg by mouth at bedtime as needed for sleep.    . fluticasone (FLONASE) 50 MCG/ACT nasal spray Place 1 spray into both nostrils daily.     . Glucos-Chond-Hyal Ac-Ca Fructo (MOVE FREE JOINT HEALTH ADVANCE PO) Take 1 tablet by mouth daily.    . hyaluronate sodium (RADIAPLEXRX) GEL Apply 1 application topically 2 (two) times daily.    . Magnesium 250 MG TABS Take 250 mg by mouth 3 (three) times daily.     . Multiple Vitamin (MULTIVITAMIN) tablet Take 1 tablet by mouth daily.    . non-metallic deodorant Jethro Poling) MISC Apply 1 application topically daily.    . Omega-3 Fatty Acids (FISH OIL) 1000  MG CAPS Take 1,000 mg by mouth 3 (three) times daily. Takes 1-3 times daily    . pravastatin (PRAVACHOL) 40 MG tablet Take 1 tablet (40 mg total) by mouth daily. (Patient taking differently: Take 40  mg by mouth every evening. At 10 pm) 30 tablet 3  . sertraline (ZOLOFT) 100 MG tablet Take 1 tablet (100 mg total) by mouth daily. 90 tablet 4  . sodium chloride (OCEAN) 0.65 % SOLN nasal spray Place 1 spray into both nostrils 2 (two) times daily as needed for congestion.    Marland Kitchen tetrahydrozoline 0.05 % ophthalmic solution Place 1 drop into both eyes daily as needed (dry eyes).    . Vitamin D, Ergocalciferol, (DRISDOL) 50000 units CAPS capsule Take 1 capsule (50,000 Units total) by mouth See admin instructions. Takes every other week on Friday 30 capsule 2  . vitamin E 400 UNIT capsule Take 400 Units by mouth 3 (three) times daily.     Marland Kitchen zinc gluconate 50 MG tablet Take 50 mg by mouth daily.     No current facility-administered medications for this visit.     REVIEW OF SYSTEMS:   Constitutional: Denies fevers, chills or abnormal night sweats (+) trouble staying asleep (+) mild hot flashes Eyes: Denies blurriness of vision, double vision or watery eyes Ears, nose, mouth, throat, and face: Denies mucositis or sore throat Respiratory: Denies cough, dyspnea or wheezes Cardiovascular: Denies palpitation, chest discomfort or lower extremity swelling Gastrointestinal:  Denies nausea, heartburn or change in bowel habits Skin: Denies abnormal skin rashes Lymphatics: Denies new lymphadenopathy or easy bruising Neurological:Denies numbness, tingling or new weaknesses MSK: (+) Right knee pain Behavioral/Psych: Mood is stable, no new changes  All other systems were reviewed with the patient and are negative.  PHYSICAL EXAMINATION: ECOG PERFORMANCE STATUS: 1  Vitals:   11/29/16 1513  BP: (!) 157/75  Pulse: 68  Resp: 20  Temp: 98 F (36.7 C)   Filed Weights   11/29/16 1513  Weight: 146 lb 3.2 oz  (66.3 kg)    GENERAL:alert, no distress and comfortable SKIN: skin color, texture, turgor are normal, no rashes or significant lesions EYES: normal, conjunctiva are pink and non-injected, sclera clear OROPHARYNX:no exudate, no erythema and lips, buccal mucosa, and tongue normal  NECK: supple, thyroid normal size, non-tender, without nodularity LYMPH:  no palpable lymphadenopathy in the cervical, axillary or inguinal LUNGS: clear to auscultation and percussion with normal breathing effort HEART: regular rate & rhythm and no murmurs and no lower extremity edema ABDOMEN:abdomen soft, non-tender and normal bowel sounds Musculoskeletal:no cyanosis of digits and no clubbing  PSYCH: alert & oriented x 3 with fluent speech NEURO: no focal motor/sensory deficits Breasts: Breast inspection showed them to be symmetrical with no nipple discharge. Palpation of the breasts and axilla revealed no obvious mass that I could appreciate. (+) diffuse redness in righ breast from radiation. Surgical scar in right axilla and around arora, healed well. Mild tenderness, no palpable mass.    LABORATORY DATA:  I have reviewed the data as listed CBC Latest Ref Rng & Units 07/20/2016 06/12/2016 06/11/2016  WBC 4.0 - 10.5 K/uL 5.2 10.7(H) 9.9  Hemoglobin 12.0 - 15.0 g/dL 11.9(L) 8.4(L) 9.9(L)  Hematocrit 36.0 - 46.0 % 36.8 25.2(L) 28.4(L)  Platelets 150 - 400 K/uL 327 228 259   CMP Latest Ref Rng & Units 07/20/2016 06/11/2016 06/04/2016  Glucose 65 - 99 mg/dL 103(H) 163(H) 123(H)  BUN 6 - 20 mg/dL '14 14 20  '$ Creatinine 0.44 - 1.00 mg/dL 0.85 0.62 0.65  Sodium 135 - 145 mmol/L 139 136 140  Potassium 3.5 - 5.1 mmol/L 4.2 4.3 3.2(L)  Chloride 101 - 111 mmol/L 100(L) 106 103  CO2 22 - 32 mmol/L 26 23  27  Calcium 8.9 - 10.3 mg/dL 9.9 8.6(L) 8.8(L)  Total Protein 6.1 - 8.1 g/dL - - -  Total Bilirubin 0.2 - 1.2 mg/dL - - -  Alkaline Phos 33 - 130 U/L - - -  AST 10 - 35 U/L - - -  ALT 6 - 29 U/L - - -   PATHOLOGY REPORT    Diagnosis 09/06/16 1. Breast, excision, Right Superior Margin - INVASIVE DUCTAL CARCINOMA, GRADE III/III, SCATTERED MICROSCOPIC FOCI. - DUCTAL CARCINOMA IN SITU WITH CALCIFICATIONS, HIGH GRADE. - INVASIVE CARCINOMA IS FOCALLY LESS THAN 0.1 CM TO THE INFERIOR MARGIN OF SPECIMEN #1.. - DUCTAL CARCINOMA IN SITU IS BROADLY LESS THAN 0.1 CM TO THE LATERAL MARGIN, FOCALLY LESS THAN 0.1 CM TO THE INFERIOR MARGIN, AND FOCALLY LESS THAN 0.1 CM TO THE ANTERIOR MARGIN OF SPECIMEN #1. - SEE COMMENT. 2. Breast, excision, Right Superolateral Margin - FIBROCYSTIC CHANGES. - THERE IS NO EVIDENCE OF MALIGNANCY. - SEE COMMENT. Microscopic Comment 1. Immunohistochemical stains for smooth muscle myosin, calponin, p63, and cytokeratin AE1/AE3 performed on multiple block highlight the presence of invasive caricnoma. (JBK:gt, 09/09/16) 2. The surgical resection margin(s) of the specimen were inked and microscopically evaluated.   Diagnosis 08/26/16 Breast, right, needle core biopsy, superior anterior - HIGH GRADE, DUCTAL CARCINOMA IN SITU - SEE COMMENT Microscopic Comment The biopsy material has high grade ductal carcinoma in situ with foci suspicious for invasion. Prognostic markers were not repeated on this sample but can be performed at clinician's request. Dr. Tresa Moore has reviewed the case and agrees with the above diagnosis. The above results were called to [facility] on August 27, 2016.  Diagnosis 07/26/16 Breast, excision, Right new Superior Margin - DUCTAL CARCINOMA IN SITU WITH CALCIFICATIONS, HIGH GRADE, SPANNING AT LEAST 1.5 CM. - FOCI HIGHLY SUSPICIOUS FOR EARLY STROMAL INVASION. - DUCTAL CARCINOMA IN SITU IS BROADLY LESS THAN 0.1 CM TO THE NEW SUPERIOR MARGIN. - SEE COMMENT. Microscopic Comment High grade ductal carcinoma in situ is present in multiple blocks submitted for histologic evaluation and spans likely at least 1.5 cm. In addition, there are a few scattered  microscopic foci highly suspicious for early invasive carcinoma. The ductal carcinoma in situ is broadly less than 0.1 cm to the new superior margin of the specimen. (JBK:kh 07-27-16)  Diagnosis 06/02/16 1. Breast, lumpectomy, Right w/seed - INVASIVE DUCTAL CARCINOMA, GRADE 2, SPANNING 0.3 CM. - HIGH GRADE DUCTAL CARCINOMA IN SITU WITH NECROSIS. - INVASIVE CARCINOMA COMES TO WITHIN <0.1 CM OF THE POSTERIOR MARGIN FOCALLY. - DUCTAL CARCINOMA IN SITU COMES TO WITHIN <0.1 CM OF THE POSTERIOR MARGIN FOCALLY AND THE SUPERIOR MARGIN FOCALLY. - BIOPSY SITE. - SEE ONCOLOGY TABLE. 2. Breast, excision, Right additional Medial Margin - FIBROCYSTIC CHANGE. - USUAL DUCTAL HYPERPLASIA. - NO MALIGNANCY IDENTIFIED. 3. Lymph node, sentinel, biopsy, Right Axillary #1 - ONE OF ONE LYMPH NODES NEGATIVE FOR CARCINOMA (0/1). 4. Lymph node, sentinel, biopsy, Right Axillary - ONE OF ONE LYMPH NODES NEGATIVE FOR CARCINOMA (0/1). Microscopic Comment 1. BREAST, INVASIVE TUMOR Procedure: Right breast lumpectomy, additional medial margin excision, right axillary sentinel lymph node biopsies. Laterality: Right. Tumor Size: 0.3 cm. Histologic Type: Invasive ductal carcinoma. Grade: 2 Tubular Differentiation: 2 Nuclear Pleomorphism: 2 Mitotic Count: 2 Ductal Carcinoma in Situ (DCIS): Present, high grade with necrosis. Extent of Tumor: Confined to breast parenchyma. Margins: Invasive carcinoma, distance from closest margin: <0.1 cm posterior margin focally. DCIS, distance from closest margin: <0.1 cm posterior margin focally, and <0.1 cm superior  margin focally. Final medial margins negative. Regional Lymph Nodes: Number of Lymph Nodes Examined: 2 Number of Sentinel Lymph Nodes Examined: 2. Microscopic Comment(continued) Lymph Nodes with Macrometastases: 0 Lymph Nodes with Micrometastases: 0 Lymph Nodes with Isolated Tumor Cells: 0 Breast Prognostic Profile: Will be performed on invasive  component. Pathologic Stage Classification (pTNM, AJCC 8th Edition): Primary Tumor (pT): pT1a Regional Lymph Nodes (pN): pN0 Distant Metastases (pM): pMX Comments: Immunohistochemistry reveals loss of basal markers (p63, smooth muscle myosin and calponin) in the invasive focus. 1. FLUORESCENCE IN-SITU HYBRIDIZATION Results: HER2 - **POSITIVE** RATIO OF HER2/CEP17 SIGNALS 3.16 AVERAGE HER2 COPY NUMBER PER CELL 15.35 1. PROGNOSTIC INDICATORS Results: IMMUNOHISTOCHEMICAL AND MORPHOMETRIC ANALYSIS PERFORMED MANUALLY Estrogen Receptor: 0%, NEGATIVE Progesterone Receptor: 0%, NEGATIVE Proliferation Marker Ki67: 10%    Diagnosis 05/19/16 Breast, right, needle core biopsy, UIQ - DUCTAL CARCINOMA WITH CALCIFICATIONS. - SEE COMMENT. Microscopic Comment The carcinoma is predominantly in situ and is high grade. There are foci highly suspicious for stromal invasion. Estrogen receptor and progesterone receptors studies will be performed and the results reported separately. The results were called to [facility] on 05/20/16. (JBK:gt, 05/20/16) Results: IMMUNOHISTOCHEMICAL AND MORPHOMETRIC ANALYSIS PERFORMED MANUALLY Estrogen Receptor: 0%, NEGATIVE Progesterone Receptor: 0%, NEGATIVE    RADIOGRAPHIC STUDIES: I have personally reviewed the radiological images as listed and agreed with the findings in the report. No results found.    ASSESSMENT & PLAN:  66 y.o. Caucasian female, postmenopausal,  presents with screening discovered right breast DCIS  1. Breast cancer of upper outer quadrant of right breast, invasive ductal carcinoma, pT1aN0M0, stage IA, ER-/PR-/HER2+, and hight grade DCIS ER-/PR- -I discussed her surgical pathology findings, which showed a small (83m) area of invasive ductal carcinoma, ER/PR negative, HER-2 positive, in the background of high-grade DCIS. She had 2 more reexcision for positive margins, and her final surgical margins were still close for  DCIS -She has started adjuvant breast radiation, tolerating well, and to finish tomorrow.  -We discussed a small risk of breast cancer recurrence. Due to the small size of the invasive ductal carcinoma, she would not need adjuvant chemotherapy. However given the ER/PR negative and HER-2 positive disease, the biology of her cancer is more aggressive, I recommend her close surveillance.  -Due to the ER/PR negative disease, she does not need antiestrogen therapy.  -I recommend her to avoid any supplement containing progesterone or estrogen due to the increased risk of cancer in the future. -She now is undergoing radiation with Dr. KSondra Comeand will be finished 11/30/16. She is tolerating well.  -We again discussed breast cancer surveillance with yearly mammograms, regular exams and labs and routine office follow-up. -Patient is going to see Dr. BBarry Dienesin Jan 2019, and I will see her back in one year.  -[facility] -I will order her mammogram for 05/2017 -We discussed signs of cancer recurrence, she knows what to watch. She will call me if she has any concerns.  2. Insomnia  -She is not able to stay sleep, since her hormonal replacement was stopped when she was diagnosed with cancer. -She is already taking melatonin -We discussed that if she wishes to do herbal supplements that they are not have soil based or estrogen/progesterone products -I discussed music or other therapies to help her sleep.    Plan -Labs and f/u in 1 year, she will see Dr. BCoralee Pesain 6 months. -Mammogram in 05/2017   -She will likely have lumpectomy and sentinel lymph  node biopsy soon -I'll review her final surgical path, to see if she has any component of invasive carcinoma. If her surgical path showed DCIS only, she will follow-up with her primary care physician for breast cancer surveillance in the future.    All questions were answered. The patient knows to call the clinic  with any problems, questions or concerns. I spent 40 minutes counseling the patient face to face. The total time spent in the appointment was 50 minutes and more than 50% was on counseling.  This document serves as a record of services personally performed by Truitt Merle, MD. It was created on her behalf by Joslyn Devon, a trained medical scribe. The creation of this record is based on the scribe's personal observations and the provider's statements to them. This document has been checked and approved by the attending provider.      Truitt Merle, MD 11/29/2016 4:14 PM

## 2016-11-29 ENCOUNTER — Telehealth: Payer: Self-pay | Admitting: Hematology

## 2016-11-29 ENCOUNTER — Encounter: Payer: Self-pay | Admitting: Hematology

## 2016-11-29 ENCOUNTER — Ambulatory Visit (HOSPITAL_BASED_OUTPATIENT_CLINIC_OR_DEPARTMENT_OTHER): Payer: Medicare Other | Admitting: Hematology

## 2016-11-29 ENCOUNTER — Ambulatory Visit
Admission: RE | Admit: 2016-11-29 | Discharge: 2016-11-29 | Disposition: A | Discharge status: Home / Self Care | Payer: Medicare Other | Source: Ambulatory Visit | Attending: Radiation Oncology | Admitting: Radiation Oncology

## 2016-11-29 VITALS — BP 157/75 | HR 68 | Temp 98.0°F | Resp 20 | Ht 62.5 in | Wt 146.2 lb

## 2016-11-29 DIAGNOSIS — C50411 Malignant neoplasm of upper-outer quadrant of right female breast: Secondary | ICD-10-CM

## 2016-11-29 DIAGNOSIS — Z880 Allergy status to penicillin: Secondary | ICD-10-CM | POA: Diagnosis not present

## 2016-11-29 DIAGNOSIS — C50211 Malignant neoplasm of upper-inner quadrant of right female breast: Secondary | ICD-10-CM | POA: Diagnosis not present

## 2016-11-29 DIAGNOSIS — G4700 Insomnia, unspecified: Secondary | ICD-10-CM

## 2016-11-29 DIAGNOSIS — Z888 Allergy status to other drugs, medicaments and biological substances status: Secondary | ICD-10-CM | POA: Diagnosis not present

## 2016-11-29 DIAGNOSIS — D0511 Intraductal carcinoma in situ of right breast: Secondary | ICD-10-CM | POA: Diagnosis not present

## 2016-11-29 DIAGNOSIS — Z171 Estrogen receptor negative status [ER-]: Secondary | ICD-10-CM | POA: Diagnosis not present

## 2016-11-29 DIAGNOSIS — C50911 Malignant neoplasm of unspecified site of right female breast: Secondary | ICD-10-CM | POA: Diagnosis not present

## 2016-11-29 DIAGNOSIS — Z7901 Long term (current) use of anticoagulants: Secondary | ICD-10-CM | POA: Diagnosis not present

## 2016-11-29 NOTE — Telephone Encounter (Signed)
Scheduled appt per 7/23 los - Gave patient AVS and calender per  [facility]

## 2016-11-30 ENCOUNTER — Ambulatory Visit
Admission: RE | Admit: 2016-11-30 | Discharge: 2016-11-30 | Disposition: A | Discharge status: Home / Self Care | Payer: Medicare Other | Source: Ambulatory Visit | Attending: Radiation Oncology | Admitting: Radiation Oncology

## 2016-11-30 ENCOUNTER — Encounter: Payer: Self-pay | Admitting: *Deleted

## 2016-11-30 DIAGNOSIS — D0511 Intraductal carcinoma in situ of right breast: Secondary | ICD-10-CM | POA: Diagnosis not present

## 2016-11-30 DIAGNOSIS — Z7901 Long term (current) use of anticoagulants: Secondary | ICD-10-CM | POA: Diagnosis not present

## 2016-11-30 DIAGNOSIS — Z880 Allergy status to penicillin: Secondary | ICD-10-CM | POA: Diagnosis not present

## 2016-11-30 DIAGNOSIS — Z888 Allergy status to other drugs, medicaments and biological substances status: Secondary | ICD-10-CM | POA: Diagnosis not present

## 2016-11-30 DIAGNOSIS — C50911 Malignant neoplasm of unspecified site of right female breast: Secondary | ICD-10-CM | POA: Diagnosis not present

## 2016-11-30 DIAGNOSIS — C50211 Malignant neoplasm of upper-inner quadrant of right female breast: Secondary | ICD-10-CM | POA: Diagnosis not present

## 2016-12-01 ENCOUNTER — Encounter: Payer: Self-pay | Admitting: Radiation Oncology

## 2016-12-01 ENCOUNTER — Telehealth: Payer: Self-pay | Admitting: Hematology

## 2016-12-01 NOTE — Telephone Encounter (Signed)
lvm to inform pt of SCP appt 10/19 at 10 am per sch msg

## 2016-12-01 NOTE — Progress Notes (Signed)
  Radiation Oncology         [phone] ________________________________  Name: Dawn West MRN: [id number]  Date: 12/01/2016  DOB: 02/26/1951  End of Treatment Note  Diagnosis:   Stage  (pT1a, pN0), grade 2 invasive ductal carcinoma of the right breast with high grade ductal carcinoma In Situ (ER negative, PR negative, HER2 positive)    Indication for treatment:  Curative    Radiation treatment dates:   10/13/16 - 11/30/16  Site/dose:    1) Right Breast treated to 50.4 Gy in 28 fractions 2) Right Breast Boosted an additional 12 Gy in 6 fractions            Beams/energy:    1) 3D  //  6X 2) 3D  //  6X  Narrative: The patient tolerated radiation treatment relatively well. The patient experienced occasional mild pain in her right breast. She also reported some fatigue and appetite reduction throughout treatment, though this did not affect her activities of daily living. She experienced some radiation related skin changes including moderate erythema across the right breast.  Plan: The patient has completed radiation treatment. The patient will continue to use Radiaplex to the skin within the treatment fields. The patient will return to radiation oncology clinic for routine followup in one month. I advised them to call or return sooner if they have any questions or concerns related to their recovery or treatment.  -----------------------------------  Blair Promise, PhD, MD  This document serves as a record of services personally performed by Gery Pray, MD. It was created on his behalf by Maryla Morrow, a trained medical scribe. The creation of this record is based on the scribe's personal observations and the provider's statements to them. This document has been checked and approved by the attending provider.

## 2016-12-10 DIAGNOSIS — M1712 Unilateral primary osteoarthritis, left knee: Secondary | ICD-10-CM | POA: Diagnosis not present

## 2016-12-10 DIAGNOSIS — Z471 Aftercare following joint replacement surgery: Secondary | ICD-10-CM | POA: Diagnosis not present

## 2016-12-10 DIAGNOSIS — M1711 Unilateral primary osteoarthritis, right knee: Secondary | ICD-10-CM | POA: Diagnosis not present

## 2016-12-10 DIAGNOSIS — Z96652 Presence of left artificial knee joint: Secondary | ICD-10-CM | POA: Diagnosis not present

## 2016-12-13 ENCOUNTER — Telehealth: Payer: Self-pay | Admitting: Obstetrics and Gynecology

## 2016-12-13 NOTE — Telephone Encounter (Signed)
Patient will call back to reschedule. Mail letter

## 2016-12-16 DIAGNOSIS — C50911 Malignant neoplasm of unspecified site of right female breast: Secondary | ICD-10-CM | POA: Diagnosis not present

## 2016-12-16 DIAGNOSIS — M791 Myalgia: Secondary | ICD-10-CM | POA: Diagnosis not present

## 2016-12-16 DIAGNOSIS — R319 Hematuria, unspecified: Secondary | ICD-10-CM | POA: Diagnosis not present

## 2016-12-16 DIAGNOSIS — Z0000 Encounter for general adult medical examination without abnormal findings: Secondary | ICD-10-CM | POA: Diagnosis not present

## 2016-12-16 DIAGNOSIS — Z01818 Encounter for other preprocedural examination: Secondary | ICD-10-CM | POA: Diagnosis not present

## 2016-12-16 DIAGNOSIS — E785 Hyperlipidemia, unspecified: Secondary | ICD-10-CM | POA: Diagnosis not present

## 2016-12-16 DIAGNOSIS — N390 Urinary tract infection, site not specified: Secondary | ICD-10-CM | POA: Diagnosis not present

## 2016-12-28 ENCOUNTER — Ambulatory Visit: Payer: Self-pay | Admitting: Orthopedic Surgery

## 2016-12-31 ENCOUNTER — Encounter: Payer: Self-pay | Admitting: Oncology

## 2016-12-31 ENCOUNTER — Ambulatory Visit: Payer: Self-pay | Admitting: Orthopedic Surgery

## 2016-12-31 NOTE — H&P (Signed)
TOTAL KNEE ADMISSION H&P  Patient is being admitted for right total knee arthroplasty.  Subjective:  Chief Complaint:right knee pain.  HPI: Dawn West, 66 y.o. female, has a history of pain and functional disability in the right knee due to arthritis and has failed non-surgical conservative treatments for greater than 12 weeks to includeNSAID's and/or analgesics, corticosteriod injections, viscosupplementation injections, flexibility and strengthening excercises, supervised PT with diminished ADL's post treatment, use of assistive devices and activity modification.  Onset of symptoms was gradual, starting 4 years ago with gradually worsening course since that time. The patient noted no past surgery on the right knee(s).  Patient currently rates pain in the right knee(s) at 10 out of 10 with activity. Patient has night pain, worsening of pain with activity and weight bearing, pain that interferes with activities of daily living, pain with passive range of motion, crepitus and joint swelling.  Patient has evidence of subchondral cysts, subchondral sclerosis, periarticular osteophytes and joint space narrowing by imaging studies. There is no active infection.  Patient Active Problem List   Diagnosis Date Noted  . Osteoarthritis of left knee 06/10/2016  . Breast cancer of upper-outer quadrant of right female breast ([place]) 05/25/2016   Past Medical History:  Diagnosis Date  . Anxiety   . Arthritis    knees  . Cancer ([place]) 12/01/11   basal cell skin ca Left upper arm  . Cancer ([place]) 04/20/2016   right breast cancer  . Complication of anesthesia   . Depression   . Headache   . History of radiation therapy 10/13/16-11/30/16   right breast 50.4 Gy in 28 fractions, right breast boost 12 Gy in 6 fractions  . Hypercholesteremia   . PONV (postoperative nausea and vomiting)     Past Surgical History:  Procedure Laterality Date  . ABDOMINAL HYSTERECTOMY    . BREAST LUMPECTOMY WITH RADIOACTIVE SEED  AND SENTINEL LYMPH NODE BIOPSY Right 06/02/2016   Procedure: RIGHT BREAST LUMPECTOMY WITH RADIOACTIVE SEED AND SENTINEL LYMPH NODE BIOPSY;  Surgeon: [name], MD;  Location: [facility];  Service: General;  Laterality: Right;  . BREAST LUMPECTOMY WITH RADIOACTIVE SEED LOCALIZATION Right 09/06/2016   Procedure: RE-EXCISION OF RIGHT BREAST LUMPECTOMY WITH RADIOACTIVE SEED LOCALIZATION;  Surgeon: [name], MD;  Location: [facility];  Service: General;  Laterality: Right;  . BREAST SURGERY  05/19/2016   right breast biopsy  . KNEE ARTHROPLASTY Left 06/10/2016   Procedure: LEFT TOTAL KNEE ARTHROPLASTY WITH COMPUTER NAVIGATION;  Surgeon: Rod Can, MD;  Location: [facility];  Service: Orthopedics;  Laterality: Left;  . MOUTH SURGERY    . RE-EXCISION OF BREAST LUMPECTOMY Right 07/26/2016   Procedure: RE-EXCISION OF RIGHT BREAST LUMPECTOMY;  Surgeon: [name], MD;  Location: [facility];  Service: General;  Laterality: Right;  . SKIN CANCER EXCISION Left 2013   upper arm, basal cell  . TONSILLECTOMY AND ADENOIDECTOMY     (DEVIATED SEPTUM  SAME TIME)  . TOTAL VAGINAL HYSTERECTOMY  12/1996   menorrhagia, fibroids     (Not in a hospital admission) Allergies  Allergen Reactions  . Allegra [Fexofenadine Hcl]     Abdominal pain  . Ciprofloxacin Other (See Comments)    UNKNOWN  . Estradiol Other (See Comments)    Pt is too no longer receive any hormonal therapy due to breast cancer  . Oxycodone Other (See Comments)    "Messes with my head"  . Penicillins Rash    Has patient had a PCN reaction  causing immediate rash, facial/tongue/throat swelling, SOB or lightheadedness with hypotension: Yes Has patient had a PCN reaction causing severe rash involving mucus membranes or skin necrosis: Yes Has patient had a PCN reaction that required hospitalization No Has patient had a PCN reaction occurring within the last 10 years: No If all of the above answers are "NO", then may  proceed with Cephalosporin use.   . Sulfa Antibiotics Rash    Social History  Substance Use Topics  . Smoking status: Never Smoker  . Smokeless tobacco: Never Used  . Alcohol use 0.0 oz/week     Comment: 3 times yearly    Family History  Problem Relation Age of Onset  . Hypertension Mother   . Heart failure Mother   . Diabetes Mother   . Stroke Mother   . COPD Mother   . Hyperlipidemia Mother   . Hypertension Father   . Heart failure Father   . Hypertension Brother   . Hypertension Sister   . Hyperlipidemia Sister   . Hypertension Brother   . Hypertension Brother   . Diabetes Brother      Review of Systems  Constitutional: Positive for malaise/fatigue.  HENT: Positive for tinnitus.   Eyes: Negative.   Respiratory: Negative.   Cardiovascular: Negative.   Gastrointestinal: Negative.   Genitourinary: Negative.   Musculoskeletal: Positive for joint pain.  Skin: Negative.   Neurological: Negative.   Endo/Heme/Allergies: Negative.   Psychiatric/Behavioral: Negative.     Objective:  Physical Exam  Vitals reviewed. Constitutional: She is oriented to person, place, and time. She appears well-developed and well-nourished.  HENT:  Head: Normocephalic and atraumatic.  Eyes: Pupils are equal, round, and reactive to light. Conjunctivae and EOM are normal.  Neck: Normal range of motion. Neck supple.  Cardiovascular: Normal rate, regular rhythm and intact distal pulses.   Respiratory: No respiratory distress.  GI: Soft. She exhibits no distension.  Genitourinary:  Genitourinary Comments: deferred  Musculoskeletal:       Right knee: She exhibits decreased range of motion, swelling, effusion and bony tenderness. Tenderness found. Medial joint line tenderness noted.  Neurological: She is alert and oriented to person, place, and time. She has normal reflexes.  Skin: Skin is warm and dry.  Psychiatric: She has a normal mood and affect. Her behavior is normal. Judgment and  thought content normal.    Vital signs in last 24 hours: @VSRANGES @  Labs:   Estimated body mass index is 26.31 kg/m as calculated from the following:   Height as of 11/29/16: 5' 2.5" (1.588 m).   Weight as of 11/29/16: 66.3 kg (146 lb 3.2 oz).   Imaging Review Plain radiographs demonstrate severe degenerative joint disease of the right knee(s). The overall alignment issignificant varus. The bone quality appears to be adequate for age and reported activity level.  Assessment/Plan:  End stage arthritis, right knee   The patient history, physical examination, clinical judgment of the provider and imaging studies are consistent with end stage degenerative joint disease of the right knee(s) and total knee arthroplasty is deemed medically necessary. The treatment options including medical management, injection therapy arthroscopy and arthroplasty were discussed at length. The risks and benefits of total knee arthroplasty were presented and reviewed. The risks due to aseptic loosening, infection, stiffness, patella tracking problems, thromboembolic complications and other imponderables were discussed. The patient acknowledged the explanation, agreed to proceed with the plan and consent was signed. Patient is being admitted for inpatient treatment for surgery, pain control, PT, OT, prophylactic  antibiotics, VTE prophylaxis, progressive ambulation and ADL's and discharge planning. The patient is planning to be discharged home with outpatient PT

## 2017-01-03 ENCOUNTER — Encounter: Payer: Self-pay | Admitting: Radiation Oncology

## 2017-01-03 ENCOUNTER — Encounter (INDEPENDENT_AMBULATORY_CARE_PROVIDER_SITE_OTHER): Payer: Self-pay

## 2017-01-03 ENCOUNTER — Ambulatory Visit
Admission: RE | Admit: 2017-01-03 | Discharge: 2017-01-03 | Disposition: A | Discharge status: Home / Self Care | Payer: Medicare Other | Source: Ambulatory Visit | Attending: Radiation Oncology | Admitting: Radiation Oncology

## 2017-01-03 DIAGNOSIS — Z7901 Long term (current) use of anticoagulants: Secondary | ICD-10-CM | POA: Diagnosis not present

## 2017-01-03 DIAGNOSIS — Z880 Allergy status to penicillin: Secondary | ICD-10-CM | POA: Diagnosis not present

## 2017-01-03 DIAGNOSIS — C50911 Malignant neoplasm of unspecified site of right female breast: Secondary | ICD-10-CM | POA: Diagnosis not present

## 2017-01-03 DIAGNOSIS — Z171 Estrogen receptor negative status [ER-]: Secondary | ICD-10-CM

## 2017-01-03 DIAGNOSIS — C50411 Malignant neoplasm of upper-outer quadrant of right female breast: Secondary | ICD-10-CM

## 2017-01-03 DIAGNOSIS — Z888 Allergy status to other drugs, medicaments and biological substances status: Secondary | ICD-10-CM | POA: Diagnosis not present

## 2017-01-03 NOTE — Progress Notes (Signed)
Radiation Oncology         [phone] ________________________________  Name: Dawn West MRN: [id number]  Date: 01/03/2017  DOB: 1950-10-30    Follow-Up Visit Note  CC: [facility]  Truitt Merle, MD    ICD-10-CM   1. Malignant neoplasm of upper-outer quadrant of right breast in female, estrogen receptor negative ([place]) C50.411    Z17.1     Diagnosis:   Stage (pT1a, pN0),grade 2 invasive ductal carcinoma of the right breast with high grade ductal carcinoma In Situ(ER negative, PR negative, HER2 positive)    Interval Since Last Radiation:  1 months  Radiation treatment dates:   10/13/16 - 11/30/16   1) Right Breast treated to 50.4 Gy in 28 fractions 2) Right Breast Boosted an additional 12 Gy in 6 fractions  Narrative:  The patient returns today for routine follow-up. She is doing well overall. She denies having any pain. She does report having some soreness in her breast after exercising her arms. She denies having any fatigue. Nursing notes, the skin on her right breast has slight hyperpigmentation. She denies any nipple discharge. States her energy level is back to 100%. The patient is scheduled for right total knee arthroplasty on 01/20/2017 with Dr. Lyla Glassing. She is scheduled for follow up with Dr. Burr Medico on 11/30/2017.  Dr. Barry Dienes in the fall.             ALLERGIES:  is allergic to allegra [fexofenadine hcl]; ciprofloxacin; estradiol; oxycodone; penicillins; and sulfa antibiotics.  Meds: Current Outpatient Prescriptions  Medication Sig Dispense Refill  . acetaminophen (TYLENOL) 650 MG CR tablet Take 1,300 mg by mouth every 8 (eight) hours as needed for pain.    . Calcium Carb-Cholecalciferol (CALCIUM 600 + D) 600-200 MG-UNIT TABS Take 1 tablet by mouth 2 (two) times daily.     . cetirizine (ZYRTEC) 10 MG tablet Take 10 mg by mouth at bedtime. At 10 pm daily    . diclofenac (VOLTAREN) 75 MG EC tablet Take 75 mg by mouth 2 (two) times daily.    .  diphenhydrAMINE (EQ NIGHTTIME SLEEP AID) 25 MG tablet Take 25 mg by mouth at bedtime as needed for sleep.    . fluticasone (FLONASE) 50 MCG/ACT nasal spray Place 1 spray into both nostrils daily.     . Glucos-Chond-Hyal Ac-Ca Fructo (MOVE FREE JOINT HEALTH ADVANCE PO) Take 1 tablet by mouth daily.    . hyaluronate sodium (RADIAPLEXRX) GEL Apply 1 application topically 2 (two) times daily.    . Magnesium 250 MG TABS Take 250 mg by mouth 3 (three) times daily.     . Multiple Vitamin (MULTIVITAMIN) tablet Take 1 tablet by mouth daily.    . Omega-3 Fatty Acids (FISH OIL) 1000 MG CAPS Take 1,000 mg by mouth 3 (three) times daily. Takes 1-3 times daily    . rosuvastatin (CRESTOR) 5 MG tablet TAKE 1 TABLET DAILY HIGH CHOLESTEROL  0  . sertraline (ZOLOFT) 100 MG tablet Take 1 tablet (100 mg total) by mouth daily. 90 tablet 4  . sodium chloride (OCEAN) 0.65 % SOLN nasal spray Place 1 spray into both nostrils 2 (two) times daily as needed for congestion.    Marland Kitchen tetrahydrozoline 0.05 % ophthalmic solution Place 1 drop into both eyes daily as needed (dry eyes).    . Vitamin D, Ergocalciferol, (DRISDOL) 50000 units CAPS capsule Take 1 capsule (50,000 Units total) by mouth See admin instructions. Takes every other week on Friday 30 capsule 2  .  vitamin E 400 UNIT capsule Take 400 Units by mouth 3 (three) times daily.     Marland Kitchen zinc gluconate 50 MG tablet Take 50 mg by mouth daily.     No current facility-administered medications for this encounter.     Physical Findings: The patient is in no acute distress. Patient is alert and oriented.  height is 5' 2.5" (1.588 m) and weight is 145 lb 9.6 oz (66 kg). Her oral temperature is 99.1 F (37.3 C). Her blood pressure is 141/66 (abnormal) and her pulse is 74. Her oxygen saturation is 96%. .   Lungs are clear to auscultation bilaterally. Heart has regular rate and rhythm. No palpable cervical, supraclavicular, or axillary adenopathy. Abdomen soft, non-tender, normal  bowel sounds. Breast exam shows no palpable mass or nipple discharge in the left breast. Right breast shows skin to be well healed, minimal hyperpigmentation changes, some swelling of the breast, no palpable mass or nipple discharge.  Lab Findings: Lab Results  Component Value Date   WBC 5.2 07/20/2016   HGB 11.9 (L) 07/20/2016   HCT 36.8 07/20/2016   MCV 88.2 07/20/2016   PLT 327 07/20/2016    Radiographic Findings: No results found.  Impression:  The patient is recovering from the effects of radiation.  No evidence of recurrence on clinical exam today.   Plan:  I encouraged her to continue follow-up with medical and surgical oncology. I will see her back on an as-needed basis. I have encouraged her to call if she has any issues or concerns in the future.  -----------------------------------  Blair Promise, PhD, MD  This document serves as a record of services personally performed by Gery Pray, MD. It was created on his behalf by Arlyce Harman, a trained medical scribe. The creation of this record is based on the scribe's personal observations and the provider's statements to them. This document has been checked and approved by the attending provider.

## 2017-01-03 NOTE — Progress Notes (Signed)
Jack is here for follow up after treatment to her right breast.  She denies having any pain.  She does report having some soreness in her breast after exercising her arms.  She denies having any fatigue.  The skin on her right breast has slight hyperpigmentation.  BP (!) 141/66 (BP Location: Left Arm, Patient Position: Sitting)   Pulse 74   Temp 99.1 F (37.3 C) (Oral)   Ht 5' 2.5" (1.588 m)   Wt 145 lb 9.6 oz (66 kg)   LMP 12/12/1996 (Exact Date)   SpO2 96%   BMI 26.21 kg/m    Wt Readings from Last 3 Encounters:  01/03/17 145 lb 9.6 oz (66 kg)  11/29/16 146 lb 3.2 oz (66.3 kg)  10/19/16 147 lb 6.4 oz (66.9 kg)

## 2017-01-11 DIAGNOSIS — Z23 Encounter for immunization: Secondary | ICD-10-CM | POA: Diagnosis not present

## 2017-01-11 NOTE — Progress Notes (Signed)
12-20-16 Surgical clearance on chart from Dr. Marin Comment  12-16-16 EKG, CBC w/Diff, CMP on chart

## 2017-01-11 NOTE — Patient Instructions (Addendum)
Dawn West  01/11/2017   Your procedure is scheduled on: 01-20-17   Report to [facility] Main  Entrance Take [facility] to Short Stay on 3rd Floor  at 8:30 AM   Call this number if you have problems the morning of surgery  [phone]   Remember: ONLY 1 PERSON MAY GO WITH YOU TO SHORT STAY TO GET  READY MORNING OF [place].  Do not eat food or drink liquids :After Midnight.     Take these medicines the morning of surgery with A SIP OF WATER: You may also bring and use your nasal spray and your eyedrops as needed.                                You may not have any metal on your body including hair pins and              piercings  Do not wear jewelry, make-up, lotions, powders or perfumes, deodorant             Do not wear nail polish.  Do not shave  48 hours prior to surgery.          Do not bring valuables to the hospital. [place].  Contacts, dentures or bridgework may not be worn into surgery.  Leave suitcase in the car. After surgery it may be brought to your room.                 Please read over the following fact sheets you were given: _____________________________________________________________________             [facility] - Preparing for Surgery Before surgery, you can play an important role.  Because skin is not sterile, your skin needs to be as free of germs as possible.  You can reduce the number of germs on your skin by washing with CHG (chlorahexidine gluconate) soap before surgery.  CHG is an antiseptic cleaner which kills germs and bonds with the skin to continue killing germs even after washing. Please DO NOT use if you have an allergy to CHG or antibacterial soaps.  If your skin becomes reddened/irritated stop using the CHG and inform your nurse when you arrive at Short Stay. Do not shave (including legs and underarms) for at least 48 hours prior to the first CHG shower.  You  may shave your face/neck. Please follow these instructions carefully:  1.  Shower with CHG Soap the night before surgery and the  morning of Surgery.  2.  If you choose to wash your hair, wash your hair first as usual with your  normal  shampoo.  3.  After you shampoo, rinse your hair and body thoroughly to remove the  shampoo.                           4.  Use CHG as you would any other liquid soap.  You can apply chg directly  to the skin and wash                       Gently with a scrungie or clean washcloth.  5.  Apply the  CHG Soap to your body ONLY FROM THE NECK DOWN.   Do not use on face/ open                           Wound or open sores. Avoid contact with eyes, ears mouth and genitals (private parts).                       Wash face,  Genitals (private parts) with your normal soap.             6.  Wash thoroughly, paying special attention to the area where your surgery  will be performed.  7.  Thoroughly rinse your body with warm water from the neck down.  8.  DO NOT shower/wash with your normal soap after using and rinsing off  the CHG Soap.                9.  Pat yourself dry with a clean towel.            10.  Wear clean pajamas.            11.  Place clean sheets on your bed the night of your first shower and do not  sleep with pets. Day of Surgery : Do not apply any lotions/deodorants the morning of surgery.  Please wear clean clothes to the hospital/surgery center.  FAILURE TO FOLLOW THESE INSTRUCTIONS MAY RESULT IN THE CANCELLATION OF YOUR SURGERY PATIENT SIGNATURE_________________________________  NURSE SIGNATURE__________________________________  ________________________________________________________________________   Dawn West  An incentive spirometer is a tool that can help keep your lungs clear and active. This tool measures how well you are filling your lungs with each breath. Taking long deep breaths may help reverse or decrease the chance of  developing breathing (pulmonary) problems (especially infection) following:  A long period of time when you are unable to move or be active. BEFORE THE PROCEDURE   If the spirometer includes an indicator to show your best effort, your nurse or respiratory therapist will set it to a desired goal.  If possible, sit up straight or lean slightly forward. Try not to slouch.  Hold the incentive spirometer in an upright position. INSTRUCTIONS FOR USE  1. Sit on the edge of your bed if possible, or sit up as far as you can in bed or on a chair. 2. Hold the incentive spirometer in an upright position. 3. Breathe out normally. 4. Place the mouthpiece in your mouth and seal your lips tightly around it. 5. Breathe in slowly and as deeply as possible, raising the piston or the ball toward the top of the column. 6. Hold your breath for 3-5 seconds or for as long as possible. Allow the piston or ball to fall to the bottom of the column. 7. Remove the mouthpiece from your mouth and breathe out normally. 8. Rest for a few seconds and repeat Steps 1 through 7 at least 10 times every 1-2 hours when you are awake. Take your time and take a few normal breaths between deep breaths. 9. The spirometer may include an indicator to show your best effort. Use the indicator as a goal to work toward during each repetition. 10. After each set of 10 deep breaths, practice coughing to be sure your lungs are clear. If you have an incision (the cut made at the time of surgery), support your incision when coughing by placing a pillow or rolled up  towels firmly against it. Once you are able to get out of bed, walk around indoors and cough well. You may stop using the incentive spirometer when instructed by your caregiver.  RISKS AND COMPLICATIONS  Take your time so you do not get dizzy or light-headed.  If you are in pain, you may need to take or ask for pain medication before doing incentive spirometry. It is harder to take a  deep breath if you are having pain. AFTER USE  Rest and breathe slowly and easily.  It can be helpful to keep track of a log of your progress. Your caregiver can provide you with a simple table to help with this. If you are using the spirometer at home, follow these instructions: [place] IF:   You are having difficultly using the spirometer.  You have trouble using the spirometer as often as instructed.  Your pain medication is not giving enough relief while using the spirometer.  You develop fever of 100.5 F (38.1 C) or higher. SEEK IMMEDIATE MEDICAL CARE IF:   You cough up bloody sputum that had not been present before.  You develop fever of 102 F (38.9 C) or greater.  You develop worsening pain at or near the incision site. MAKE SURE YOU:   Understand these instructions.  Will watch your condition.  Will get help right away if you are not doing well or get worse. Document Released: 09/06/2006 Document Revised: 07/19/2011 Document Reviewed: 11/07/2006 Exit[employer] Patient Information 2014 Exit[employer], [place].   ________________________________________________________________________  WHAT IS A BLOOD TRANSFUSION? Blood Transfusion Information  A transfusion is the replacement of blood or some of its parts. Blood is made up of multiple cells which provide different functions.  Red blood cells carry oxygen and are used for blood loss replacement.  White blood cells fight against infection.  Platelets control bleeding.  Plasma helps clot blood.  Other blood products are available for specialized needs, such as hemophilia or other clotting disorders. BEFORE THE TRANSFUSION  Who gives blood for transfusions?   Healthy volunteers who are fully evaluated to make sure their blood is safe. This is blood bank blood. Transfusion therapy is the safest it has ever been in the practice of medicine. Before blood is taken from a donor, a complete history is taken to make  sure that person has no history of diseases nor engages in risky social behavior (examples are intravenous drug use or sexual activity with multiple partners). The donor's travel history is screened to minimize risk of transmitting infections, such as malaria. The donated blood is tested for signs of infectious diseases, such as HIV and hepatitis. The blood is then tested to be sure it is compatible with you in order to minimize the chance of a transfusion reaction. If you or a relative donates blood, this is often done in anticipation of surgery and is not appropriate for emergency situations. It takes many days to process the donated blood. RISKS AND COMPLICATIONS Although transfusion therapy is very safe and saves many lives, the main dangers of transfusion include:   Getting an infectious disease.  Developing a transfusion reaction. This is an allergic reaction to something in the blood you were given. Every precaution is taken to prevent this. The decision to have a blood transfusion has been considered carefully by your caregiver before blood is given. Blood is not given unless the benefits outweigh the risks. AFTER THE TRANSFUSION  Right after receiving a blood transfusion, you will usually feel much  better and more energetic. This is especially true if your red blood cells have gotten low (anemic). The transfusion raises the level of the red blood cells which carry oxygen, and this usually causes an energy increase.  The nurse administering the transfusion will monitor you carefully for complications. HOME CARE INSTRUCTIONS  No special instructions are needed after a transfusion. You may find your energy is better. Speak with your caregiver about any limitations on activity for underlying diseases you may have. SEEK MEDICAL CARE IF:   Your condition is not improving after your transfusion.  You develop redness or irritation at the intravenous (IV) site. SEEK IMMEDIATE MEDICAL CARE IF:   Any of the following symptoms occur over the next 12 hours:  Shaking chills.  You have a temperature by mouth above 102 F (38.9 C), not controlled by medicine.  Chest, back, or muscle pain.  People around you feel you are not acting correctly or are confused.  Shortness of breath or difficulty breathing.  Dizziness and fainting.  You get a rash or develop hives.  You have a decrease in urine output.  Your urine turns a dark color or changes to pink, red, or brown. Any of the following symptoms occur over the next 10 days:  You have a temperature by mouth above 102 F (38.9 C), not controlled by medicine.  Shortness of breath.  Weakness after normal activity.  The white part of the eye turns yellow (jaundice).  You have a decrease in the amount of urine or are urinating less often.  Your urine turns a dark color or changes to pink, red, or brown. Document Released: 04/23/2000 Document Revised: 07/19/2011 Document Reviewed: 12/11/2007 [facility] Patient Information 2014 [place], [place].  _______________________________________________________________________

## 2017-01-12 ENCOUNTER — Encounter (HOSPITAL_COMMUNITY): Payer: Self-pay

## 2017-01-12 ENCOUNTER — Encounter (HOSPITAL_COMMUNITY)
Admission: RE | Admit: 2017-01-12 | Discharge: 2017-01-12 | Disposition: A | Discharge status: Home / Self Care | Payer: Medicare Other | Source: Ambulatory Visit | Attending: Orthopedic Surgery | Admitting: Orthopedic Surgery

## 2017-01-12 DIAGNOSIS — Z01812 Encounter for preprocedural laboratory examination: Secondary | ICD-10-CM | POA: Diagnosis not present

## 2017-01-12 DIAGNOSIS — M1711 Unilateral primary osteoarthritis, right knee: Secondary | ICD-10-CM | POA: Insufficient documentation

## 2017-01-12 LAB — URINALYSIS, ROUTINE W REFLEX MICROSCOPIC
BACTERIA UA: NONE SEEN
BILIRUBIN URINE: NEGATIVE
Glucose, UA: NEGATIVE mg/dL
HGB URINE DIPSTICK: NEGATIVE
KETONES UR: NEGATIVE mg/dL
NITRITE: NEGATIVE
Protein, ur: NEGATIVE mg/dL
Specific Gravity, Urine: 1.005 (ref 1.005–1.030)
WBC UA: NONE SEEN WBC/hpf (ref 0–5)
pH: 6 (ref 5.0–8.0)

## 2017-01-12 LAB — SURGICAL PCR SCREEN
MRSA, PCR: NEGATIVE
Staphylococcus aureus: NEGATIVE

## 2017-01-12 NOTE — Progress Notes (Signed)
Contacted Dr. Sid Falcon office. Spoke to [employer]. Verbal order received for a UA per Dr. Lyla Glassing. Written order will be placed.Dawn West

## 2017-01-12 NOTE — Progress Notes (Signed)
01-13-17 UA result routed to Dr. Lyla Glassing for review

## 2017-01-20 ENCOUNTER — Inpatient Hospital Stay (HOSPITAL_COMMUNITY): Payer: Medicare Other | Admitting: Anesthesiology

## 2017-01-20 ENCOUNTER — Inpatient Hospital Stay (HOSPITAL_COMMUNITY): Payer: Medicare Other

## 2017-01-20 ENCOUNTER — Inpatient Hospital Stay (HOSPITAL_COMMUNITY)
Admission: RE | Admit: 2017-01-20 | Discharge: 2017-01-22 | DRG: 470 | Disposition: A | Discharge status: Home / Self Care | Payer: Medicare Other | Source: Ambulatory Visit | Attending: Orthopedic Surgery | Admitting: Orthopedic Surgery

## 2017-01-20 ENCOUNTER — Encounter (HOSPITAL_COMMUNITY)
Admission: RE | Disposition: A | Discharge status: Home / Self Care | Payer: Self-pay | Source: Ambulatory Visit | Attending: Orthopedic Surgery

## 2017-01-20 ENCOUNTER — Encounter (HOSPITAL_COMMUNITY): Payer: Self-pay | Admitting: Emergency Medicine

## 2017-01-20 DIAGNOSIS — Z96652 Presence of left artificial knee joint: Secondary | ICD-10-CM | POA: Diagnosis present

## 2017-01-20 DIAGNOSIS — Z853 Personal history of malignant neoplasm of breast: Secondary | ICD-10-CM

## 2017-01-20 DIAGNOSIS — F419 Anxiety disorder, unspecified: Secondary | ICD-10-CM | POA: Diagnosis present

## 2017-01-20 DIAGNOSIS — Z880 Allergy status to penicillin: Secondary | ICD-10-CM

## 2017-01-20 DIAGNOSIS — M1711 Unilateral primary osteoarthritis, right knee: Secondary | ICD-10-CM | POA: Diagnosis present

## 2017-01-20 DIAGNOSIS — F329 Major depressive disorder, single episode, unspecified: Secondary | ICD-10-CM | POA: Diagnosis present

## 2017-01-20 DIAGNOSIS — Z471 Aftercare following joint replacement surgery: Secondary | ICD-10-CM | POA: Diagnosis not present

## 2017-01-20 DIAGNOSIS — Z09 Encounter for follow-up examination after completed treatment for conditions other than malignant neoplasm: Secondary | ICD-10-CM

## 2017-01-20 DIAGNOSIS — Z888 Allergy status to other drugs, medicaments and biological substances status: Secondary | ICD-10-CM | POA: Diagnosis not present

## 2017-01-20 DIAGNOSIS — Z885 Allergy status to narcotic agent status: Secondary | ICD-10-CM | POA: Diagnosis not present

## 2017-01-20 DIAGNOSIS — Z881 Allergy status to other antibiotic agents status: Secondary | ICD-10-CM | POA: Diagnosis not present

## 2017-01-20 DIAGNOSIS — E7800 Pure hypercholesterolemia, unspecified: Secondary | ICD-10-CM | POA: Diagnosis present

## 2017-01-20 DIAGNOSIS — C50411 Malignant neoplasm of upper-outer quadrant of right female breast: Secondary | ICD-10-CM | POA: Diagnosis not present

## 2017-01-20 DIAGNOSIS — Z882 Allergy status to sulfonamides status: Secondary | ICD-10-CM | POA: Diagnosis not present

## 2017-01-20 DIAGNOSIS — Z85828 Personal history of other malignant neoplasm of skin: Secondary | ICD-10-CM

## 2017-01-20 DIAGNOSIS — Z923 Personal history of irradiation: Secondary | ICD-10-CM

## 2017-01-20 DIAGNOSIS — G8918 Other acute postprocedural pain: Secondary | ICD-10-CM | POA: Diagnosis not present

## 2017-01-20 DIAGNOSIS — Z96651 Presence of right artificial knee joint: Secondary | ICD-10-CM | POA: Diagnosis not present

## 2017-01-20 HISTORY — PX: KNEE ARTHROPLASTY: SHX992

## 2017-01-20 LAB — TYPE AND SCREEN
ABO/RH(D): A POS
Antibody Screen: NEGATIVE

## 2017-01-20 SURGERY — ARTHROPLASTY, KNEE, TOTAL, USING IMAGELESS COMPUTER-ASSISTED NAVIGATION
Anesthesia: Spinal | Site: Knee | Laterality: Right

## 2017-01-20 MED ORDER — BUPIVACAINE-EPINEPHRINE 0.25% -1:200000 IJ SOLN
INTRAMUSCULAR | Status: DC | PRN
  Administered 2017-01-20: 30 mL

## 2017-01-20 MED ORDER — PROPOFOL 10 MG/ML IV BOLUS
INTRAVENOUS | Status: AC
  Filled 2017-01-20: qty 20

## 2017-01-20 MED ORDER — DEXAMETHASONE SODIUM PHOSPHATE 10 MG/ML IJ SOLN
10.0000 mg | Freq: Once | INTRAMUSCULAR | Status: AC
  Administered 2017-01-21: 10 mg via INTRAVENOUS
  Filled 2017-01-20: qty 1

## 2017-01-20 MED ORDER — VITAMIN E 180 MG (400 UNIT) PO CAPS
400.0000 [IU] | ORAL_CAPSULE | Freq: Three times a day (TID) | ORAL | Status: DC
  Administered 2017-01-20 – 2017-01-22 (×5): 400 [IU] via ORAL
  Filled 2017-01-20 (×7): qty 1

## 2017-01-20 MED ORDER — HYDROMORPHONE HCL-NACL 0.5-0.9 MG/ML-% IV SOSY: 0.5000 mg | PREFILLED_SYRINGE | INTRAVENOUS | Status: DC | PRN

## 2017-01-20 MED ORDER — KETOROLAC TROMETHAMINE 15 MG/ML IJ SOLN
7.5000 mg | Freq: Four times a day (QID) | INTRAMUSCULAR | Status: AC
  Administered 2017-01-20 – 2017-01-21 (×4): 7.5 mg via INTRAVENOUS
  Filled 2017-01-20 (×4): qty 1

## 2017-01-20 MED ORDER — PROPOFOL 500 MG/50ML IV EMUL
INTRAVENOUS | Status: DC | PRN
  Administered 2017-01-20: 40 ug/kg/min via INTRAVENOUS

## 2017-01-20 MED ORDER — CEFAZOLIN SODIUM-DEXTROSE 2-4 GM/100ML-% IV SOLN
2.0000 g | Freq: Four times a day (QID) | INTRAVENOUS | Status: AC
  Administered 2017-01-20 (×2): 2 g via INTRAVENOUS
  Filled 2017-01-20 (×2): qty 100

## 2017-01-20 MED ORDER — FENTANYL CITRATE (PF) 100 MCG/2ML IJ SOLN
50.0000 ug | INTRAMUSCULAR | Status: DC | PRN
  Administered 2017-01-20: 100 ug via INTRAVENOUS

## 2017-01-20 MED ORDER — METOCLOPRAMIDE HCL 5 MG/ML IJ SOLN: 5.0000 mg | Freq: Three times a day (TID) | INTRAMUSCULAR | Status: DC | PRN

## 2017-01-20 MED ORDER — EPHEDRINE 5 MG/ML INJ
INTRAVENOUS | Status: AC
  Filled 2017-01-20: qty 10

## 2017-01-20 MED ORDER — PROPOFOL 10 MG/ML IV BOLUS
INTRAVENOUS | Status: AC
  Filled 2017-01-20: qty 40

## 2017-01-20 MED ORDER — EPHEDRINE SULFATE-NACL 50-0.9 MG/10ML-% IV SOSY
PREFILLED_SYRINGE | INTRAVENOUS | Status: DC | PRN
  Administered 2017-01-20 (×2): 5 mg via INTRAVENOUS

## 2017-01-20 MED ORDER — CLINDAMYCIN PHOSPHATE 900 MG/50ML IV SOLN
900.0000 mg | INTRAVENOUS | Status: AC
  Administered 2017-01-20: 900 mg via INTRAVENOUS
  Filled 2017-01-20 (×2): qty 50

## 2017-01-20 MED ORDER — LIDOCAINE 2% (20 MG/ML) 5 ML SYRINGE
INTRAMUSCULAR | Status: AC
  Filled 2017-01-20: qty 5

## 2017-01-20 MED ORDER — ACETAMINOPHEN 10 MG/ML IV SOLN
1000.0000 mg | INTRAVENOUS | Status: AC
  Administered 2017-01-20: 1000 mg via INTRAVENOUS
  Filled 2017-01-20: qty 100

## 2017-01-20 MED ORDER — CALCIUM CARBONATE-VITAMIN D 500-200 MG-UNIT PO TABS
1.0000 | ORAL_TABLET | Freq: Two times a day (BID) | ORAL | Status: DC
  Administered 2017-01-20 – 2017-01-22 (×4): 1 via ORAL
  Filled 2017-01-20 (×4): qty 1

## 2017-01-20 MED ORDER — ZINC GLUCONATE 50 MG PO TABS: 50.0000 mg | ORAL_TABLET | Freq: Every day | ORAL | Status: DC

## 2017-01-20 MED ORDER — TRANEXAMIC ACID 1000 MG/10ML IV SOLN
1000.0000 mg | INTRAVENOUS | Status: AC
  Administered 2017-01-20: 1000 mg via INTRAVENOUS
  Filled 2017-01-20: qty 1100

## 2017-01-20 MED ORDER — MIDAZOLAM HCL 2 MG/2ML IJ SOLN
1.0000 mg | INTRAMUSCULAR | Status: DC | PRN
  Administered 2017-01-20: 2 mg via INTRAVENOUS

## 2017-01-20 MED ORDER — MAGNESIUM OXIDE 400 (241.3 MG) MG PO TABS
200.0000 mg | ORAL_TABLET | Freq: Three times a day (TID) | ORAL | Status: DC
  Administered 2017-01-20 – 2017-01-22 (×5): 200 mg via ORAL
  Filled 2017-01-20 (×5): qty 1

## 2017-01-20 MED ORDER — STERILE WATER FOR IRRIGATION IR SOLN
Status: DC | PRN
  Administered 2017-01-20: 2000 mL

## 2017-01-20 MED ORDER — PHENYLEPHRINE 40 MCG/ML (10ML) SYRINGE FOR IV PUSH (FOR BLOOD PRESSURE SUPPORT)
PREFILLED_SYRINGE | INTRAVENOUS | Status: DC | PRN
  Administered 2017-01-20 (×2): 80 ug via INTRAVENOUS

## 2017-01-20 MED ORDER — MIDAZOLAM HCL 2 MG/2ML IJ SOLN
INTRAMUSCULAR | Status: AC
  Administered 2017-01-20: 2 mg via INTRAVENOUS
  Filled 2017-01-20: qty 2

## 2017-01-20 MED ORDER — DEXAMETHASONE SODIUM PHOSPHATE 10 MG/ML IJ SOLN
INTRAMUSCULAR | Status: DC | PRN
  Administered 2017-01-20: 10 mg via INTRAVENOUS

## 2017-01-20 MED ORDER — KETOROLAC TROMETHAMINE 30 MG/ML IJ SOLN
INTRAMUSCULAR | Status: AC
  Filled 2017-01-20: qty 1

## 2017-01-20 MED ORDER — ONDANSETRON HCL 4 MG PO TABS: 4.0000 mg | ORAL_TABLET | Freq: Four times a day (QID) | ORAL | Status: DC | PRN

## 2017-01-20 MED ORDER — SODIUM CHLORIDE 0.9 % IJ SOLN
INTRAMUSCULAR | Status: AC
Start: 2017-01-20 — End: ?
  Filled 2017-01-20: qty 10

## 2017-01-20 MED ORDER — MENTHOL 3 MG MT LOZG: 1.0000 | LOZENGE | OROMUCOSAL | Status: DC | PRN

## 2017-01-20 MED ORDER — ONDANSETRON HCL 4 MG/2ML IJ SOLN
INTRAMUSCULAR | Status: AC
  Filled 2017-01-20: qty 2

## 2017-01-20 MED ORDER — DEXAMETHASONE SODIUM PHOSPHATE 10 MG/ML IJ SOLN
INTRAMUSCULAR | Status: AC
  Filled 2017-01-20: qty 1

## 2017-01-20 MED ORDER — SALINE SPRAY 0.65 % NA SOLN
1.0000 | Freq: Two times a day (BID) | NASAL | Status: DC | PRN
  Filled 2017-01-20: qty 44

## 2017-01-20 MED ORDER — BUPIVACAINE-EPINEPHRINE (PF) 0.25% -1:200000 IJ SOLN
INTRAMUSCULAR | Status: AC
  Filled 2017-01-20: qty 30

## 2017-01-20 MED ORDER — DOCUSATE SODIUM 100 MG PO CAPS
100.0000 mg | ORAL_CAPSULE | Freq: Two times a day (BID) | ORAL | Status: DC
  Administered 2017-01-20 – 2017-01-22 (×4): 100 mg via ORAL
  Filled 2017-01-20 (×4): qty 1

## 2017-01-20 MED ORDER — ALUM & MAG HYDROXIDE-SIMETH 200-200-20 MG/5ML PO SUSP: 30.0000 mL | ORAL | Status: DC | PRN

## 2017-01-20 MED ORDER — ACETAMINOPHEN 325 MG PO TABS: 650.0000 mg | ORAL_TABLET | Freq: Four times a day (QID) | ORAL | Status: DC | PRN

## 2017-01-20 MED ORDER — APIXABAN 2.5 MG PO TABS
2.5000 mg | ORAL_TABLET | Freq: Two times a day (BID) | ORAL | Status: DC
  Administered 2017-01-21: 2.5 mg via ORAL
  Filled 2017-01-20: qty 1

## 2017-01-20 MED ORDER — FENTANYL CITRATE (PF) 100 MCG/2ML IJ SOLN
INTRAMUSCULAR | Status: AC
  Administered 2017-01-20: 100 ug via INTRAVENOUS
  Filled 2017-01-20: qty 2

## 2017-01-20 MED ORDER — NAPHAZOLINE-GLYCERIN 0.012-0.2 % OP SOLN
1.0000 [drp] | Freq: Four times a day (QID) | OPHTHALMIC | Status: DC | PRN
  Filled 2017-01-20: qty 15

## 2017-01-20 MED ORDER — DEXTROSE 5 % IV SOLN
500.0000 mg | Freq: Four times a day (QID) | INTRAVENOUS | Status: DC | PRN
  Filled 2017-01-20: qty 5

## 2017-01-20 MED ORDER — ONDANSETRON HCL 4 MG/2ML IJ SOLN
INTRAMUSCULAR | Status: DC | PRN
  Administered 2017-01-20: 4 mg via INTRAVENOUS

## 2017-01-20 MED ORDER — SODIUM CHLORIDE 0.9 % IV SOLN
INTRAVENOUS | Status: DC
  Administered 2017-01-20: 17:00:00 via INTRAVENOUS

## 2017-01-20 MED ORDER — CALCIUM-VITAMIN D 500-200 MG-UNIT PO TABS
1.0000 | ORAL_TABLET | Freq: Two times a day (BID) | ORAL | Status: DC
  Filled 2017-01-20: qty 1

## 2017-01-20 MED ORDER — SENNA 8.6 MG PO TABS
2.0000 | ORAL_TABLET | Freq: Every day | ORAL | Status: DC
  Administered 2017-01-20 – 2017-01-21 (×2): 17.2 mg via ORAL
  Filled 2017-01-20 (×2): qty 2

## 2017-01-20 MED ORDER — ZINC SULFATE 220 (50 ZN) MG PO CAPS
220.0000 mg | ORAL_CAPSULE | Freq: Every day | ORAL | Status: DC
  Administered 2017-01-21 – 2017-01-22 (×2): 220 mg via ORAL
  Filled 2017-01-20 (×2): qty 1

## 2017-01-20 MED ORDER — ROPIVACAINE HCL 7.5 MG/ML IJ SOLN
INTRAMUSCULAR | Status: DC | PRN
  Administered 2017-01-20: 20 mL via PERINEURAL

## 2017-01-20 MED ORDER — SODIUM CHLORIDE 0.9 % IR SOLN
Status: DC | PRN
  Administered 2017-01-20: 4000 mL

## 2017-01-20 MED ORDER — ISOPROPYL ALCOHOL 70 % SOLN
Status: DC | PRN
  Administered 2017-01-20: 1 via TOPICAL

## 2017-01-20 MED ORDER — POVIDONE-IODINE 10 % EX SWAB: 2.0000 "application " | Freq: Once | CUTANEOUS | Status: DC

## 2017-01-20 MED ORDER — PROMETHAZINE HCL 25 MG/ML IJ SOLN: 6.2500 mg | INTRAMUSCULAR | Status: DC | PRN

## 2017-01-20 MED ORDER — BUPIVACAINE HCL (PF) 0.5 % IJ SOLN
INTRAMUSCULAR | Status: AC
  Filled 2017-01-20: qty 30

## 2017-01-20 MED ORDER — HYDROMORPHONE HCL-NACL 0.5-0.9 MG/ML-% IV SOSY: 0.2500 mg | PREFILLED_SYRINGE | INTRAVENOUS | Status: DC | PRN

## 2017-01-20 MED ORDER — SODIUM CHLORIDE 0.9 % IR SOLN
Status: DC | PRN
Start: 2017-01-20 — End: 2017-01-20
  Administered 2017-01-20: 1000 mL

## 2017-01-20 MED ORDER — BUPIVACAINE HCL (PF) 0.5 % IJ SOLN
INTRAMUSCULAR | Status: DC | PRN
  Administered 2017-01-20: 3 mL

## 2017-01-20 MED ORDER — DIPHENHYDRAMINE HCL 12.5 MG/5ML PO ELIX
12.5000 mg | ORAL_SOLUTION | ORAL | Status: DC | PRN
  Administered 2017-01-20: 25 mg via ORAL
  Filled 2017-01-20: qty 10

## 2017-01-20 MED ORDER — ROSUVASTATIN CALCIUM 5 MG PO TABS
5.0000 mg | ORAL_TABLET | Freq: Every day | ORAL | Status: DC
  Administered 2017-01-20 – 2017-01-21 (×2): 5 mg via ORAL
  Filled 2017-01-20 (×2): qty 1

## 2017-01-20 MED ORDER — SODIUM CHLORIDE 0.9 % IV SOLN
INTRAVENOUS | Status: DC
Start: 2017-01-20 — End: 2017-01-20

## 2017-01-20 MED ORDER — ADULT MULTIVITAMIN W/MINERALS CH
1.0000 | ORAL_TABLET | Freq: Every day | ORAL | Status: DC
  Administered 2017-01-21 – 2017-01-22 (×2): 1 via ORAL
  Filled 2017-01-20 (×2): qty 1

## 2017-01-20 MED ORDER — VITAMIN D (ERGOCALCIFEROL) 1.25 MG (50000 UNIT) PO CAPS: 50000.0000 [IU] | ORAL_CAPSULE | ORAL | Status: DC

## 2017-01-20 MED ORDER — ONDANSETRON HCL 4 MG/2ML IJ SOLN
4.0000 mg | Freq: Four times a day (QID) | INTRAMUSCULAR | Status: DC | PRN
  Administered 2017-01-22: 4 mg via INTRAVENOUS
  Filled 2017-01-20: qty 2

## 2017-01-20 MED ORDER — METOCLOPRAMIDE HCL 5 MG PO TABS: 5.0000 mg | ORAL_TABLET | Freq: Three times a day (TID) | ORAL | Status: DC | PRN

## 2017-01-20 MED ORDER — KETOROLAC TROMETHAMINE 30 MG/ML IJ SOLN
INTRAMUSCULAR | Status: DC | PRN
  Administered 2017-01-20: 30 mg via INTRAVENOUS

## 2017-01-20 MED ORDER — SODIUM CHLORIDE 0.9 % IJ SOLN
INTRAMUSCULAR | Status: DC | PRN
  Administered 2017-01-20: 29 mL via INTRAVENOUS

## 2017-01-20 MED ORDER — ACETAMINOPHEN 650 MG RE SUPP: 650.0000 mg | Freq: Four times a day (QID) | RECTAL | Status: DC | PRN

## 2017-01-20 MED ORDER — SERTRALINE HCL 100 MG PO TABS
100.0000 mg | ORAL_TABLET | Freq: Every day | ORAL | Status: DC
  Administered 2017-01-20 – 2017-01-22 (×3): 100 mg via ORAL
  Filled 2017-01-20 (×3): qty 1

## 2017-01-20 MED ORDER — ISOPROPYL ALCOHOL 70 % SOLN
Status: AC
  Filled 2017-01-20: qty 480

## 2017-01-20 MED ORDER — FLUTICASONE PROPIONATE 50 MCG/ACT NA SUSP
1.0000 | Freq: Every day | NASAL | Status: DC
  Administered 2017-01-21 – 2017-01-22 (×2): 1 via NASAL
  Filled 2017-01-20: qty 16

## 2017-01-20 MED ORDER — LACTATED RINGERS IV SOLN
INTRAVENOUS | Status: DC
  Administered 2017-01-20 (×2): via INTRAVENOUS

## 2017-01-20 MED ORDER — METHOCARBAMOL 500 MG PO TABS: 500.0000 mg | ORAL_TABLET | Freq: Four times a day (QID) | ORAL | Status: DC | PRN

## 2017-01-20 MED ORDER — PHENOL 1.4 % MT LIQD: 1.0000 | OROMUCOSAL | Status: DC | PRN

## 2017-01-20 MED ORDER — PROPOFOL 10 MG/ML IV BOLUS
INTRAVENOUS | Status: DC | PRN
  Administered 2017-01-20 (×2): 20 mg via INTRAVENOUS

## 2017-01-20 MED ORDER — LACTATED RINGERS IV SOLN: INTRAVENOUS | Status: DC

## 2017-01-20 MED ORDER — CHLORHEXIDINE GLUCONATE 4 % EX LIQD: 60.0000 mL | Freq: Once | CUTANEOUS | Status: DC

## 2017-01-20 MED ORDER — TRANEXAMIC ACID 1000 MG/10ML IV SOLN
1000.0000 mg | Freq: Once | INTRAVENOUS | Status: AC
  Administered 2017-01-20: 1000 mg via INTRAVENOUS
  Filled 2017-01-20: qty 1100

## 2017-01-20 MED ORDER — HYDROCODONE-ACETAMINOPHEN 5-325 MG PO TABS
1.0000 | ORAL_TABLET | ORAL | Status: DC | PRN
  Administered 2017-01-21 – 2017-01-22 (×6): 2 via ORAL
  Administered 2017-01-22 (×2): 1 via ORAL
  Filled 2017-01-20 (×7): qty 2
  Filled 2017-01-20: qty 1
  Filled 2017-01-20: qty 2

## 2017-01-20 MED ORDER — SODIUM CHLORIDE 0.9 % IJ SOLN
INTRAMUSCULAR | Status: AC
  Filled 2017-01-20: qty 50

## 2017-01-20 MED ORDER — POLYETHYLENE GLYCOL 3350 17 G PO PACK
17.0000 g | PACK | Freq: Every day | ORAL | Status: DC | PRN
  Administered 2017-01-21: 17 g via ORAL
  Filled 2017-01-20: qty 1

## 2017-01-20 SURGICAL SUPPLY — 65 items
ADH SKN CLS APL DERMABOND .7 (GAUZE/BANDAGES/DRESSINGS) ×1
BAG SPEC THK2 15X12 ZIP CLS (MISCELLANEOUS) ×1
BAG ZIPLOCK 12X15 (MISCELLANEOUS) ×1 IMPLANT
BANDAGE ACE 4X5 VEL STRL LF (GAUZE/BANDAGES/DRESSINGS) ×2 IMPLANT
BANDAGE ACE 6X5 VEL STRL LF (GAUZE/BANDAGES/DRESSINGS) ×2 IMPLANT
BLADE SAW RECIPROCATING 77.5 (BLADE) ×2 IMPLANT
CAPT KNEE TRIATH TK-4 ×1 IMPLANT
CHLORAPREP W/TINT 26ML (MISCELLANEOUS) ×4 IMPLANT
COVER SURGICAL LIGHT HANDLE (MISCELLANEOUS) ×2 IMPLANT
CUFF TOURN SGL QUICK 34 (TOURNIQUET CUFF) ×2
CUFF TRNQT CYL 34X4X40X1 (TOURNIQUET CUFF) ×1 IMPLANT
DECANTER SPIKE VIAL GLASS SM (MISCELLANEOUS) ×4 IMPLANT
DERMABOND ADVANCED (GAUZE/BANDAGES/DRESSINGS) ×1
DERMABOND ADVANCED .7 DNX12 (GAUZE/BANDAGES/DRESSINGS) ×2 IMPLANT
DRAPE SHEET LG 3/4 BI-LAMINATE (DRAPES) ×4 IMPLANT
DRAPE U-SHAPE 47X51 STRL (DRAPES) ×2 IMPLANT
DRESSING AQUACEL AG SP 3.5X10 (GAUZE/BANDAGES/DRESSINGS) IMPLANT
DRSG AQUACEL AG ADV 3.5X10 (GAUZE/BANDAGES/DRESSINGS) ×2 IMPLANT
DRSG AQUACEL AG SP 3.5X10 (GAUZE/BANDAGES/DRESSINGS) ×2
DRSG TEGADERM 4X4.75 (GAUZE/BANDAGES/DRESSINGS) IMPLANT
ELECT BLADE TIP CTD 4 INCH (ELECTRODE) ×2 IMPLANT
ELECT REM PT RETURN 15FT ADLT (MISCELLANEOUS) ×2 IMPLANT
EVACUATOR 1/8 PVC DRAIN (DRAIN) IMPLANT
GAUZE SPONGE 4X4 12PLY STRL (GAUZE/BANDAGES/DRESSINGS) ×2 IMPLANT
GLOVE BIO SURGEON STRL SZ8 (GLOVE) ×2 IMPLANT
GLOVE BIO SURGEON STRL SZ8.5 (GLOVE) ×4 IMPLANT
GLOVE BIOGEL M STRL SZ7.5 (GLOVE) ×2 IMPLANT
GLOVE BIOGEL PI IND STRL 7.5 (GLOVE) IMPLANT
GLOVE BIOGEL PI IND STRL 8 (GLOVE) IMPLANT
GLOVE BIOGEL PI IND STRL 8.5 (GLOVE) ×1 IMPLANT
GLOVE BIOGEL PI INDICATOR 7.5 (GLOVE) ×4
GLOVE BIOGEL PI INDICATOR 8 (GLOVE) ×1
GLOVE BIOGEL PI INDICATOR 8.5 (GLOVE) ×1
GLOVE SURG SS PI 7.5 STRL IVOR (GLOVE) ×1 IMPLANT
GOWN SPEC L3 XXLG W/TWL (GOWN DISPOSABLE) ×3 IMPLANT
GOWN STRL REUS W/TWL XL LVL3 (GOWN DISPOSABLE) ×2 IMPLANT
HANDPIECE INTERPULSE COAX TIP (DISPOSABLE) ×2
HOOD PEEL AWAY FLYTE STAYCOOL (MISCELLANEOUS) ×4 IMPLANT
MARKER SKIN DUAL TIP RULER LAB (MISCELLANEOUS) ×2 IMPLANT
NDL SPNL 18GX3.5 QUINCKE PK (NEEDLE) ×1 IMPLANT
NEEDLE SPNL 18GX3.5 QUINCKE PK (NEEDLE) ×2 IMPLANT
PACK TOTAL KNEE CUSTOM (KITS) ×2 IMPLANT
PADDING CAST COTTON 6X4 STRL (CAST SUPPLIES) ×3 IMPLANT
POSITIONER SURGICAL ARM (MISCELLANEOUS) ×2 IMPLANT
SAW OSC TIP CART 19.5X105X1.3 (SAW) ×2 IMPLANT
SEALER BIPOLAR AQUA 6.0 (INSTRUMENTS) ×2 IMPLANT
SET HNDPC FAN SPRY TIP SCT (DISPOSABLE) ×1 IMPLANT
SET PAD KNEE POSITIONER (MISCELLANEOUS) ×2 IMPLANT
SPONGE DRAIN TRACH 4X4 STRL 2S (GAUZE/BANDAGES/DRESSINGS) IMPLANT
SPONGE LAP 18X18 X RAY DECT (DISPOSABLE) IMPLANT
SUCTION FRAZIER HANDLE 12FR (TUBING) ×1
SUCTION TUBE FRAZIER 12FR DISP (TUBING) ×1 IMPLANT
SUT MNCRL AB 3-0 PS2 18 (SUTURE) ×2 IMPLANT
SUT MON AB 2-0 CT1 36 (SUTURE) ×4 IMPLANT
SUT STRATAFIX PDO 1 14 VIOLET (SUTURE) ×2
SUT STRATFX PDO 1 14 VIOLET (SUTURE) ×1
SUT VIC AB 1 CT1 36 (SUTURE) ×6 IMPLANT
SUT VIC AB 2-0 CT1 27 (SUTURE) ×2
SUT VIC AB 2-0 CT1 TAPERPNT 27 (SUTURE) ×1 IMPLANT
SUTURE STRATFX PDO 1 14 VIOLET (SUTURE) ×1 IMPLANT
SYR 50ML LL SCALE MARK (SYRINGE) ×2 IMPLANT
TOWER CARTRIDGE SMART MIX (DISPOSABLE) IMPLANT
TRAY FOLEY CATH SILVER 14FR (SET/KITS/TRAYS/PACK) ×1 IMPLANT
WRAP KNEE MAXI GEL POST OP (GAUZE/BANDAGES/DRESSINGS) ×2 IMPLANT
YANKAUER SUCT BULB TIP 10FT TU (MISCELLANEOUS) ×2 IMPLANT

## 2017-01-20 NOTE — Anesthesia Procedure Notes (Addendum)
Anesthesia Regional Block: Adductor canal block   Pre-Anesthetic Checklist: ,, timeout performed, Correct Patient, Correct Site, Correct Laterality, Correct Procedure, Correct Position, site marked, Risks and benefits discussed,  Surgical consent,  Pre-op evaluation,  At surgeon's request and post-op pain management  Laterality: Right  Prep: chloraprep       Needles:  Injection technique: Single-shot  Needle Type: Echogenic Needle     Needle Length: 9cm  Needle Gauge: 21     Additional Needles:   Procedures: ultrasound guided,,,,,,,,  Narrative:  Start time: 01/20/2017 9:42 AM End time: 01/20/2017 9:48 AM Injection made incrementally with aspirations every 5 mL.  Performed by: Personally  Anesthesiologist: Suzette Battiest

## 2017-01-20 NOTE — Anesthesia Preprocedure Evaluation (Addendum)
Anesthesia Evaluation  Patient identified by MRN, date of birth, ID band Patient awake    Reviewed: Allergy & Precautions, NPO status , Patient's Chart, lab work & pertinent test results  History of Anesthesia Complications (+) PONV  Airway Mallampati: II  TM Distance: >3 FB Neck ROM: Full    Dental no notable dental hx.    Pulmonary neg pulmonary ROS,    Pulmonary exam normal breath sounds clear to auscultation       Cardiovascular negative cardio ROS Normal cardiovascular exam Rhythm:Regular Rate:Normal     Neuro/Psych negative neurological ROS  negative psych ROS   GI/Hepatic negative GI ROS, Neg liver ROS,   Endo/Other  negative endocrine ROS  Renal/GU negative Renal ROS  negative genitourinary   Musculoskeletal  (+) Arthritis ,   Abdominal   Peds negative pediatric ROS (+)  Hematology negative hematology ROS (+)   Anesthesia Other Findings   Reproductive/Obstetrics negative OB ROS                             Lab Results  Component Value Date   WBC 5.2 07/20/2016   HGB 11.9 (L) 07/20/2016   HCT 36.8 07/20/2016   MCV 88.2 07/20/2016   PLT 327 07/20/2016   Lab Results  Component Value Date   CREATININE 0.85 07/20/2016   BUN 14 07/20/2016   NA 139 07/20/2016   K 4.2 07/20/2016   CL 100 (L) 07/20/2016   CO2 26 07/20/2016    Anesthesia Physical  Anesthesia Plan  ASA: II  Anesthesia Plan: Spinal   Post-op Pain Management:    Induction: Intravenous  PONV Risk Score and Plan: 3 and Ondansetron, Dexamethasone, Midazolam, Propofol infusion and Treatment may vary due to age or medical condition  Airway Management Planned: Natural Airway and Simple Face Mask  Additional Equipment:   Intra-op Plan:   Post-operative Plan:   Informed Consent: I have reviewed the patients History and Physical, chart, labs and discussed the procedure including the risks, benefits and  alternatives for the proposed anesthesia with the patient or authorized representative who has indicated his/her understanding and acceptance.   Dental advisory given  Plan Discussed with: CRNA  Anesthesia Plan Comments:        Anesthesia Quick Evaluation

## 2017-01-20 NOTE — Anesthesia Procedure Notes (Signed)
Spinal  Patient location during procedure: OR Start time: 01/20/2017 10:00 AM End time: 01/20/2017 10:07 AM Staffing Anesthesiologist: Suzette Battiest Performed: anesthesiologist  Preanesthetic Checklist Completed: patient identified, site marked, surgical consent, pre-op evaluation, timeout performed, IV checked, risks and benefits discussed and monitors and equipment checked Spinal Block Patient position: sitting Prep: site prepped and draped and DuraPrep Patient monitoring: blood pressure, continuous pulse ox and heart rate Approach: midline Location: L4-5 Injection technique: single-shot Needle Needle type: Pencan  Needle gauge: 24 G Needle length: 9 cm Needle insertion depth: 6 cm

## 2017-01-20 NOTE — Anesthesia Postprocedure Evaluation (Signed)
Anesthesia Post Note  Patient: Dawn West  Procedure(s) Performed: Procedure(s) (LRB): RIGHT TOTAL KNEE ARTHROPLASTY WITH COMPUTER NAVIGATION (Right)     Patient location during evaluation: PACU Anesthesia Type: Spinal Level of consciousness: oriented and awake and alert Pain management: pain level controlled Vital Signs Assessment: post-procedure vital signs reviewed and stable Respiratory status: spontaneous breathing, respiratory function stable and patient connected to nasal cannula oxygen Cardiovascular status: blood pressure returned to baseline and stable Postop Assessment: no headache, no backache and no apparent nausea or vomiting Anesthetic complications: no    Last Vitals:  Vitals:   01/20/17 1452 01/20/17 1542  BP:  117/63  Pulse:  70  Resp:  14  Temp:  36.6 C  SpO2: 100% 100%    Last Pain:  Vitals:   01/20/17 1542  TempSrc: Oral  PainSc:                  [name]

## 2017-01-20 NOTE — Transfer of Care (Signed)
Immediate Anesthesia Transfer of Care Note  Patient: Dawn West  Procedure(s) Performed: Procedure(s) with comments: RIGHT TOTAL KNEE ARTHROPLASTY WITH COMPUTER NAVIGATION (Right) - Adductor Block  Patient Location: PACU  Anesthesia Type:Spinal and MAC combined with regional for post-op pain  Level of Consciousness: awake, alert  and oriented  Airway & Oxygen Therapy: Patient Spontanous Breathing and Patient connected to nasal cannula oxygen  Post-op Assessment: Report given to RN and Post -op Vital signs reviewed and stable  Post vital signs: Reviewed and stable  Last Vitals:  Vitals:   01/20/17 0946 01/20/17 0948  BP:    Pulse: 67   Resp: 13 12  Temp:    SpO2: 98%     Last Pain:  Vitals:   01/20/17 0918  TempSrc:   PainSc: 0-No pain      Patients Stated Pain Goal: 4 (85/92/92 4462)  Complications: No apparent anesthesia complications

## 2017-01-20 NOTE — Progress Notes (Signed)
AssistedDr. Rob Fitzgerald with right, ultrasound guided, adductor canal block. Side rails up, monitors on throughout procedure. See vital signs in flow sheet. Tolerated Procedure well.  

## 2017-01-20 NOTE — Discharge Instructions (Signed)
°Dr. Brian Swinteck °Total Joint Specialist °[facility] °[address] °[place], [place] [address] °[phone] ° °TOTAL KNEE REPLACEMENT POSTOPERATIVE DIRECTIONS ° ° ° °Knee Rehabilitation, Guidelines Following Surgery  °Results after knee surgery are often greatly improved when you follow the exercise, range of motion and muscle strengthening exercises prescribed by your doctor. Safety measures are also important to protect the knee from further injury. Any time any of these exercises cause you to have increased pain or swelling in your knee joint, decrease the amount until you are comfortable again and slowly increase them. If you have problems or questions, call your caregiver or physical therapist for advice.  ° °WEIGHT BEARING °Weight bearing as tolerated with assist device (walker, cane, etc) as directed, use it as long as suggested by your surgeon or therapist, typically at least 4-6 weeks. ° °HOME CARE INSTRUCTIONS  °Remove items at home which could result in a fall. This includes throw rugs or furniture in walking pathways.  °Continue medications as instructed at time of discharge. °You may have some home medications which will be placed on hold until you complete the course of blood thinner medication.  °You may start showering once you are discharged home but do not submerge the incision under water. Just pat the incision dry and apply a dry gauze dressing on daily. °Walk with walker as instructed.  °You may resume a sexual relationship in one month or when given the OK by your doctor.  °· Use walker as long as suggested by your caregivers. °· Avoid periods of inactivity such as sitting longer than an hour when not asleep. This helps prevent blood clots.  °You may put full weight on your legs and walk as much as is comfortable.  °You may return to work once you are cleared by your doctor.  °Do not drive a car for 6 weeks or until released by you surgeon.  °· Do not drive while  taking narcotics.  °Wear the elastic stockings for three weeks following surgery during the day but you may remove then at night. °Make sure you keep all of your appointments after your operation with all of your doctors and caregivers. You should call the office at the above phone number and make an appointment for approximately two weeks after the date of your surgery. °Do not remove your surgical dressing. The dressing is waterproof; you may take showers in 3 days, but do not take tub baths or submerge the dressing. °Please pick up a stool softener and laxative for home use as long as you are requiring pain medications. °· ICE to the affected knee every three hours for 30 minutes at a time and then as needed for pain and swelling.  Continue to use ice on the knee for pain and swelling from surgery. You may notice swelling that will progress down to the foot and ankle.  This is normal after surgery.  Elevate the leg when you are not up walking on it.   °It is important for you to complete the blood thinner medication as prescribed by your doctor. °· Continue to use the breathing machine which will help keep your temperature down.  It is common for your temperature to cycle up and down following surgery, especially at night when you are not up moving around and exerting yourself.  The breathing machine keeps your lungs expanded and your temperature down. ° °RANGE OF MOTION AND STRENGTHENING EXERCISES  °Rehabilitation of the knee is important following a   knee injury or an operation. After just a few days of immobilization, the muscles of the thigh which control the knee become weakened and shrink (atrophy). Knee exercises are designed to build up the tone and strength of the thigh muscles and to improve knee motion. Often times heat used for twenty to thirty minutes before working out will loosen up your tissues and help with improving the range of motion but do not use heat for the first two weeks following  surgery. These exercises can be done on a training (exercise) mat, on the floor, on a table or on a bed. Use what ever works the best and is most comfortable for you Knee exercises include:  °Leg Lifts - While your knee is still immobilized in a splint or cast, you can do straight leg raises. Lift the leg to 60 degrees, hold for 3 sec, and slowly lower the leg. Repeat 10-20 times 2-3 times daily. Perform this exercise against resistance later as your knee gets better.  °Quad and Hamstring Sets - Tighten up the muscle on the front of the thigh (Quad) and hold for 5-10 sec. Repeat this 10-20 times hourly. Hamstring sets are done by pushing the foot backward against an object and holding for 5-10 sec. Repeat as with quad sets.  °A rehabilitation program following serious knee injuries can speed recovery and prevent re-injury in the future due to weakened muscles. Contact your doctor or a physical therapist for more information on knee rehabilitation.  ° °SKILLED REHAB INSTRUCTIONS: °If the patient is transferred to a skilled rehab facility following release from the hospital, a list of the current medications will be sent to the facility for the patient to continue.  When discharged from the skilled rehab facility, please have the facility set up the patient's Home Health Physical Therapy prior to being released. Also, the skilled facility will be responsible for providing the patient with their medications at time of release from the facility to include their pain medication, the muscle relaxants, and their blood thinner medication. If the patient is still at the rehab facility at time of the two week follow up appointment, the skilled rehab facility will also need to assist the patient in arranging follow up appointment in our office and any transportation needs. ° °MAKE SURE YOU:  °Understand these instructions.  °Will watch your condition.  °Will get help right away if you are not doing well or get worse.   ° ° °Pick up stool softner and laxative for home use following surgery while on pain medications. °Do NOT remove your dressing. You may shower.  °Do not take tub baths or submerge incision under water. °May shower starting three days after surgery. °Please use a clean towel to pat the incision dry following showers. °Continue to use ice for pain and swelling after surgery. °Do not use any lotions or creams on the incision until instructed by your surgeon. °____________ ° °Information on my medicine - ELIQUIS® (apixaban) ° °This medication education was reviewed with me or my healthcare representative as part of my discharge preparation.  °Why was Eliquis® prescribed for you? °Eliquis® was prescribed for you to reduce the risk of blood clots forming after orthopedic surgery.   ° °What do You need to know about Eliquis®? °Take your Eliquis® TWICE DAILY - one tablet in the morning and one tablet in the evening with or without food.  It would be best to take the dose about the same time each day. ° °If   you have difficulty swallowing the tablet whole please discuss with your pharmacist how to take the medication safely. ° °Take Eliquis® exactly as prescribed by your doctor and DO NOT stop taking Eliquis® without talking to the doctor who prescribed the medication.  Stopping without other medication to take the place of Eliquis® may increase your risk of developing a clot. ° °After discharge, you should have regular check-up appointments with your healthcare provider that is prescribing your Eliquis®. ° °What do you do if you miss a dose? °If a dose of ELIQUIS® is not taken at the scheduled time, take it as soon as possible on the same day and twice-daily administration should be resumed.  The dose should not be doubled to make up for a missed dose.  Do not take more than one tablet of ELIQUIS at the same time. ° °Important Safety Information °A possible side effect of Eliquis® is bleeding. You should call your  healthcare provider right away if you experience any of the following: °? Bleeding from an injury or your nose that does not stop. °? Unusual colored urine (red or dark brown) or unusual colored stools (red or black). °? Unusual bruising for unknown reasons. °? A serious fall or if you hit your head (even if there is no bleeding). ° °Some medicines may interact with Eliquis® and might increase your risk of bleeding or clotting while on Eliquis®. To help avoid this, consult your healthcare provider or pharmacist prior to using any new prescription or non-prescription medications, including herbals, vitamins, non-steroidal anti-inflammatory drugs (NSAIDs) and supplements. ° °This website has more information on Eliquis® (apixaban): http://www.eliquis.com/eliquis/home ° ° °

## 2017-01-20 NOTE — Op Note (Signed)
OPERATIVE REPORT  SURGEON: Rod Can, MD   ASSISTANT: Nehemiah Massed, PA-C.  PREOPERATIVE DIAGNOSIS: Right knee arthritis.   POSTOPERATIVE DIAGNOSIS: Right knee arthritis.   PROCEDURE: Right total knee arthroplasty.   IMPLANTS: Stryker Triathlon CR femur, size 4. Stryker Tritanium tibia, size 4. X3 polyethelyene insert, size 9 mm, CR. 3 button asymmetric patella, size 32 mm.  ANESTHESIA:  Spinal  TOURNIQUET TIME: Not utilized.   ESTIMATED BLOOD LOSS: 200 cc.  ANTIBIOTICS: 2 g Ancef.  DRAINS: None.  COMPLICATIONS: None   CONDITION: PACU - hemodynamically stable.   BRIEF CLINICAL NOTE: Dawn West is a 66 y.o. female with a long-standing history of Right knee arthritis. After failing conservative management, the patient was indicated for total knee arthroplasty. The risks, benefits, and alternatives to the procedure were explained, and the patient elected to proceed.  PROCEDURE IN DETAIL: Adductor canal block was obtained in the pre-op holding area. Once inside the operative room, spinal anesthesia was obtained, and a foley catheter was inserted. The patient was then positioned, a nonsterile tourniquet was placed, and the lower extremity was prepped and draped in the normal sterile surgical fashion. A time-out was called verifying side and site of surgery. The patient received IV antibiotics within 60 minutes of beginning the procedure. The tourniquet was not utilized.  An anterior approach to the knee was performed utilizing a midvastus arthrotomy. A medial release was performed and the patellar fat pad was excised. Stryker navigation was used to cut the distal femur perpendicular to the mechanical axis. A freehand patellar resection was performed, and the patella was sized an prepared with 3 lug holes.  Nagivation was used to make a neutral proximal tibia resection,  taking 9 mm of bone from the less affected lateral side with 3 degrees of slope. The menisci were excised. A spacer block was placed, and the alignment and balance in extension were confirmed.   The distal femur was sized using the 3-degree external rotation guide referencing the posterior femoral cortex. The appropriate 4-in-1 cutting block was pinned into place. Rotation was checked using Whiteside's line, the epicondylar axis, and then confirmed with a spacer block in flexion. The remaining femoral cuts were performed, taking care to protect the MCL.  The tibia was sized and the trial tray was pinned into place. The remaining trail components were inserted. The knee was stable to varus and valgus stress through a full range of motion. The patella tracked centrally, and the PCL was well balanced. The trial components were removed, and the proximal tibial surface was prepared. Final components were impacted into place. The knee was tested for a final time and found to be well balanced.  The wound was copiously irrigated with normal saline with pulse lavage. Marcaine solution was injected into the periarticular soft tissue. The wound was closed in layers using #1 Vicryl and Stratafix for the fascia, 2-0 Vicryl for the subcutaneous fat, 2-0 Monocryl for the deep dermal layer, 3-0 running Monocryl subcuticular Stitch, and Dermabond for the skin. Once the glue was fully dried, an Aquacell Ag and compressive dressing were applied. Tthe patient was transported to the recovery room in stable condition. Sponge, needle, and instrument counts were correct at the end of the case x2. The patient tolerated the procedure well and there were no known complications.  Please note that a surgical assistant was a medical necessity for this procedure in order to perform it in a safe and expeditious manner. Surgical assistant was necessary to retract the  ligaments and vital neurovascular structures to prevent injury to  them and also necessary for proper positioning of the limb to allow for anatomic placement of the prosthesis.

## 2017-01-20 NOTE — Interval H&P Note (Signed)
History and Physical Interval Note:  01/20/2017 9:34 AM  Dawn West  has presented today for surgery, with the diagnosis of Degenerative joint disease right knee  The various methods of treatment have been discussed with the patient and family. After consideration of risks, benefits and other options for treatment, the patient has consented to  Procedure(s) with comments: RIGHT TOTAL KNEE ARTHROPLASTY WITH COMPUTER NAVIGATION (Right) - Needs RNFA as a surgical intervention .  The patient's history has been reviewed, patient examined, no change in status, stable for surgery.  I have reviewed the patient's chart and labs.  Questions were answered to the patient's satisfaction.     Egor Fullilove, Horald Pollen

## 2017-01-20 NOTE — Anesthesia Procedure Notes (Addendum)
Procedure Name: MAC Date/Time: 01/20/2017 10:00 AM Performed by: Lollie Sails Oxygen Delivery Method: Simple face mask

## 2017-01-20 NOTE — H&P (View-Only) (Signed)
TOTAL KNEE ADMISSION H&P  Patient is being admitted for right total knee arthroplasty.  Subjective:  Chief Complaint:right knee pain.  HPI: Dawn West, 66 y.o. female, has a history of pain and functional disability in the right knee due to arthritis and has failed non-surgical conservative treatments for greater than 12 weeks to includeNSAID's and/or analgesics, corticosteriod injections, viscosupplementation injections, flexibility and strengthening excercises, supervised PT with diminished ADL's post treatment, use of assistive devices and activity modification.  Onset of symptoms was gradual, starting 4 years ago with gradually worsening course since that time. The patient noted no past surgery on the right knee(s).  Patient currently rates pain in the right knee(s) at 10 out of 10 with activity. Patient has night pain, worsening of pain with activity and weight bearing, pain that interferes with activities of daily living, pain with passive range of motion, crepitus and joint swelling.  Patient has evidence of subchondral cysts, subchondral sclerosis, periarticular osteophytes and joint space narrowing by imaging studies. There is no active infection.  Patient Active Problem List   Diagnosis Date Noted  . Osteoarthritis of left knee 06/10/2016  . Breast cancer of upper-outer quadrant of right female breast ([place]) 05/25/2016   Past Medical History:  Diagnosis Date  . Anxiety   . Arthritis    knees  . Cancer ([place]) 12/01/11   basal cell skin ca Left upper arm  . Cancer ([place]) 04/20/2016   right breast cancer  . Complication of anesthesia   . Depression   . Headache   . History of radiation therapy 10/13/16-11/30/16   right breast 50.4 Gy in 28 fractions, right breast boost 12 Gy in 6 fractions  . Hypercholesteremia   . PONV (postoperative nausea and vomiting)     Past Surgical History:  Procedure Laterality Date  . ABDOMINAL HYSTERECTOMY    . BREAST LUMPECTOMY WITH RADIOACTIVE SEED  AND SENTINEL LYMPH NODE BIOPSY Right 06/02/2016   Procedure: RIGHT BREAST LUMPECTOMY WITH RADIOACTIVE SEED AND SENTINEL LYMPH NODE BIOPSY;  Surgeon: [name], MD;  Location: [facility];  Service: General;  Laterality: Right;  . BREAST LUMPECTOMY WITH RADIOACTIVE SEED LOCALIZATION Right 09/06/2016   Procedure: RE-EXCISION OF RIGHT BREAST LUMPECTOMY WITH RADIOACTIVE SEED LOCALIZATION;  Surgeon: [name], MD;  Location: [facility];  Service: General;  Laterality: Right;  . BREAST SURGERY  05/19/2016   right breast biopsy  . KNEE ARTHROPLASTY Left 06/10/2016   Procedure: LEFT TOTAL KNEE ARTHROPLASTY WITH COMPUTER NAVIGATION;  Surgeon: Rod Can, MD;  Location: [facility];  Service: Orthopedics;  Laterality: Left;  . MOUTH SURGERY    . RE-EXCISION OF BREAST LUMPECTOMY Right 07/26/2016   Procedure: RE-EXCISION OF RIGHT BREAST LUMPECTOMY;  Surgeon: [name], MD;  Location: [facility];  Service: General;  Laterality: Right;  . SKIN CANCER EXCISION Left 2013   upper arm, basal cell  . TONSILLECTOMY AND ADENOIDECTOMY     (DEVIATED SEPTUM  SAME TIME)  . TOTAL VAGINAL HYSTERECTOMY  12/1996   menorrhagia, fibroids     (Not in a hospital admission) Allergies  Allergen Reactions  . Allegra [Fexofenadine Hcl]     Abdominal pain  . Ciprofloxacin Other (See Comments)    UNKNOWN  . Estradiol Other (See Comments)    Pt is too no longer receive any hormonal therapy due to breast cancer  . Oxycodone Other (See Comments)    "Messes with my head"  . Penicillins Rash    Has patient had a PCN reaction  causing immediate rash, facial/tongue/throat swelling, SOB or lightheadedness with hypotension: Yes Has patient had a PCN reaction causing severe rash involving mucus membranes or skin necrosis: Yes Has patient had a PCN reaction that required hospitalization No Has patient had a PCN reaction occurring within the last 10 years: No If all of the above answers are "NO", then may  proceed with Cephalosporin use.   . Sulfa Antibiotics Rash    Social History  Substance Use Topics  . Smoking status: Never Smoker  . Smokeless tobacco: Never Used  . Alcohol use 0.0 oz/week     Comment: 3 times yearly    Family History  Problem Relation Age of Onset  . Hypertension Mother   . Heart failure Mother   . Diabetes Mother   . Stroke Mother   . COPD Mother   . Hyperlipidemia Mother   . Hypertension Father   . Heart failure Father   . Hypertension Brother   . Hypertension Sister   . Hyperlipidemia Sister   . Hypertension Brother   . Hypertension Brother   . Diabetes Brother      Review of Systems  Constitutional: Positive for malaise/fatigue.  HENT: Positive for tinnitus.   Eyes: Negative.   Respiratory: Negative.   Cardiovascular: Negative.   Gastrointestinal: Negative.   Genitourinary: Negative.   Musculoskeletal: Positive for joint pain.  Skin: Negative.   Neurological: Negative.   Endo/Heme/Allergies: Negative.   Psychiatric/Behavioral: Negative.     Objective:  Physical Exam  Vitals reviewed. Constitutional: She is oriented to person, place, and time. She appears well-developed and well-nourished.  HENT:  Head: Normocephalic and atraumatic.  Eyes: Pupils are equal, round, and reactive to light. Conjunctivae and EOM are normal.  Neck: Normal range of motion. Neck supple.  Cardiovascular: Normal rate, regular rhythm and intact distal pulses.   Respiratory: No respiratory distress.  GI: Soft. She exhibits no distension.  Genitourinary:  Genitourinary Comments: deferred  Musculoskeletal:       Right knee: She exhibits decreased range of motion, swelling, effusion and bony tenderness. Tenderness found. Medial joint line tenderness noted.  Neurological: She is alert and oriented to person, place, and time. She has normal reflexes.  Skin: Skin is warm and dry.  Psychiatric: She has a normal mood and affect. Her behavior is normal. Judgment and  thought content normal.    Vital signs in last 24 hours: @VSRANGES @  Labs:   Estimated body mass index is 26.31 kg/m as calculated from the following:   Height as of 11/29/16: 5' 2.5" (1.588 m).   Weight as of 11/29/16: 66.3 kg (146 lb 3.2 oz).   Imaging Review Plain radiographs demonstrate severe degenerative joint disease of the right knee(s). The overall alignment issignificant varus. The bone quality appears to be adequate for age and reported activity level.  Assessment/Plan:  End stage arthritis, right knee   The patient history, physical examination, clinical judgment of the provider and imaging studies are consistent with end stage degenerative joint disease of the right knee(s) and total knee arthroplasty is deemed medically necessary. The treatment options including medical management, injection therapy arthroscopy and arthroplasty were discussed at length. The risks and benefits of total knee arthroplasty were presented and reviewed. The risks due to aseptic loosening, infection, stiffness, patella tracking problems, thromboembolic complications and other imponderables were discussed. The patient acknowledged the explanation, agreed to proceed with the plan and consent was signed. Patient is being admitted for inpatient treatment for surgery, pain control, PT, OT, prophylactic  antibiotics, VTE prophylaxis, progressive ambulation and ADL's and discharge planning. The patient is planning to be discharged home with outpatient PT

## 2017-01-21 LAB — BASIC METABOLIC PANEL
ANION GAP: 8 (ref 5–15)
BUN: 13 mg/dL (ref 6–20)
CHLORIDE: 105 mmol/L (ref 101–111)
CO2: 26 mmol/L (ref 22–32)
CREATININE: 0.61 mg/dL (ref 0.44–1.00)
Calcium: 9 mg/dL (ref 8.9–10.3)
Glucose, Bld: 122 mg/dL — ABNORMAL HIGH (ref 65–99)
Potassium: 4.1 mmol/L (ref 3.5–5.1)
SODIUM: 139 mmol/L (ref 135–145)

## 2017-01-21 LAB — CBC
HCT: 29 % — ABNORMAL LOW (ref 36.0–46.0)
HEMOGLOBIN: 9.9 g/dL — AB (ref 12.0–15.0)
MCH: 30.8 pg (ref 26.0–34.0)
MCHC: 34.1 g/dL (ref 30.0–36.0)
MCV: 90.3 fL (ref 78.0–100.0)
PLATELETS: 210 10*3/uL (ref 150–400)
RBC: 3.21 MIL/uL — AB (ref 3.87–5.11)
RDW: 12.1 % (ref 11.5–15.5)
WBC: 10.6 10*3/uL — ABNORMAL HIGH (ref 4.0–10.5)

## 2017-01-21 MED ORDER — ASPIRIN 81 MG PO CHEW
81.0000 mg | CHEWABLE_TABLET | Freq: Two times a day (BID) | ORAL | Status: DC
  Administered 2017-01-21 – 2017-01-22 (×3): 81 mg via ORAL
  Filled 2017-01-21 (×3): qty 1

## 2017-01-21 MED ORDER — ONDANSETRON HCL 4 MG PO TABS: 4.0000 mg | ORAL_TABLET | Freq: Four times a day (QID) | ORAL | 0 refills | Status: DC | PRN

## 2017-01-21 MED ORDER — DOCUSATE SODIUM 100 MG PO CAPS: 100.0000 mg | ORAL_CAPSULE | Freq: Two times a day (BID) | ORAL | 1 refills | Status: AC

## 2017-01-21 MED ORDER — ASPIRIN 81 MG PO CHEW: 81.0000 mg | CHEWABLE_TABLET | Freq: Two times a day (BID) | ORAL | 1 refills | Status: DC

## 2017-01-21 MED ORDER — HYDROCODONE-ACETAMINOPHEN 5-325 MG PO TABS: 1.0000 | ORAL_TABLET | ORAL | 0 refills | Status: DC | PRN

## 2017-01-21 MED ORDER — SENNA 8.6 MG PO TABS: 2.0000 | ORAL_TABLET | Freq: Every day | ORAL | 0 refills | Status: DC

## 2017-01-21 NOTE — Progress Notes (Signed)
Lives with spouse in a 3 story home with 3 steps. No DME needs. Scheduled to start OP PT at [facility] on 01-24-17. [phone]

## 2017-01-21 NOTE — Discharge Summary (Signed)
Physician Discharge Summary  Patient ID: JEROME OTTER MRN: [id number] DOB/AGE: [birth year] 66 y.o.  Admit date: 01/20/2017 Discharge date: 01/22/2017  Admission Diagnoses:  Osteoarthritis of right knee  Discharge Diagnoses:  Principal Problem:   Osteoarthritis of right knee   Past Medical History:  Diagnosis Date  . Anxiety   . Arthritis    knees  . Cancer ([place]) 12/01/11   basal cell skin ca Left upper arm  . Cancer ([place]) 04/20/2016   right breast cancer  . Complication of anesthesia   . Depression   . Headache   . History of radiation therapy 10/13/16-11/30/16   right breast 50.4 Gy in 28 fractions, right breast boost 12 Gy in 6 fractions  . Hypercholesteremia   . PONV (postoperative nausea and vomiting)     Surgeries: Procedure(s): RIGHT TOTAL KNEE ARTHROPLASTY WITH COMPUTER NAVIGATION on 01/20/2017   Consultants (if any):   Discharged Condition: Improved  Hospital Course: ISRAA CABAN is an 66 y.o. female who was admitted 01/20/2017 with a diagnosis of Osteoarthritis of right knee and went to the operating room on 01/20/2017 and underwent the above named procedures.    She was given perioperative antibiotics:  Anti-infectives    Start     Dose/Rate Route Frequency Ordered Stop   01/20/17 1600  ceFAZolin (ANCEF) IVPB 2g/100 mL premix     2 g 200 mL/hr over 30 Minutes Intravenous Every 6 hours 01/20/17 1451 01/20/17 2133   01/20/17 0819  clindamycin (CLEOCIN) IVPB 900 mg     900 mg 100 mL/hr over 30 Minutes Intravenous On call to O.R. 01/20/17 5102 01/20/17 1039    .  She was given sequential compression devices, early ambulation, and ASA for DVT prophylaxis.  She benefited maximally from the hospital stay and there were no complications.    Recent vital signs:  Vitals:   01/21/17 2224 01/22/17 0520  BP: 135/72 (!) 150/67  Pulse: 66 62  Resp: 16 16  Temp: 98.4 F (36.9 C) 97.8 F (36.6 C)  SpO2: 96% 98%    Recent laboratory studies:  Lab Results   Component Value Date   HGB 9.6 (L) 01/22/2017   HGB 9.9 (L) 01/21/2017   HGB 11.9 (L) 07/20/2016   Lab Results  Component Value Date   WBC 8.1 01/22/2017   PLT 189 01/22/2017   Lab Results  Component Value Date   INR 0.98 07/20/2016   Lab Results  Component Value Date   NA 139 01/21/2017   K 4.1 01/21/2017   CL 105 01/21/2017   CO2 26 01/21/2017   BUN 13 01/21/2017   CREATININE 0.61 01/21/2017   GLUCOSE 122 (H) 01/21/2017    Discharge Medications:   Allergies as of 01/22/2017      Reactions   Allegra [fexofenadine Hcl]    Abdominal pain   Ciprofloxacin Other (See Comments)   UNKNOWN   Estradiol Other (See Comments)   Pt is too no longer receive any hormonal therapy due to breast cancer   Oxycodone Other (See Comments)   "Messes with my head"   Penicillins Rash   Has patient had a PCN reaction causing immediate rash, facial/tongue/throat swelling, SOB or lightheadedness with hypotension: Yes Has patient had a PCN reaction causing severe rash involving mucus membranes or skin necrosis: Yes Has patient had a PCN reaction that required hospitalization No Has patient had a PCN reaction occurring within the last 10 years: No If all of the above answers are "NO", then may proceed  with Cephalosporin use.   Sulfa Antibiotics Rash      Medication List    STOP taking these medications   acetaminophen 650 MG CR tablet Commonly known as:  TYLENOL     TAKE these medications   aspirin 81 MG chewable tablet Chew 1 tablet (81 mg total) by mouth 2 (two) times daily with a meal.   CALCIUM 600 + D 600-200 MG-UNIT Tabs Generic drug:  Calcium Carb-Cholecalciferol Take 1 tablet by mouth 2 (two) times daily.   cetirizine 10 MG tablet Commonly known as:  ZYRTEC Take 10 mg by mouth at bedtime. At 10 pm daily   diclofenac 75 MG EC tablet Commonly known as:  VOLTAREN Take 75 mg by mouth 2 (two) times daily.   docusate sodium 100 MG capsule Commonly known as:  COLACE Take 1  capsule (100 mg total) by mouth 2 (two) times daily.   EQ NIGHTTIME SLEEP AID 25 MG tablet Generic drug:  diphenhydrAMINE Take 25 mg by mouth at bedtime as needed for sleep.   Fish Oil 1000 MG Caps Take 1,000 mg by mouth 3 (three) times daily. Takes 1-3 times daily   fluticasone 50 MCG/ACT nasal spray Commonly known as:  FLONASE Place 1 spray into both nostrils daily.   hyaluronate sodium Gel Apply 1 application topically 2 (two) times daily.   HYDROcodone-acetaminophen 5-325 MG tablet Commonly known as:  NORCO/VICODIN Take 1-2 tablets by mouth every 4 (four) hours as needed (breakthrough pain).   Magnesium 250 MG Tabs Take 250 mg by mouth 3 (three) times daily.   MOVE FREE JOINT HEALTH ADVANCE PO Take 1 tablet by mouth daily.   multivitamin tablet Take 1 tablet by mouth daily.   ondansetron 4 MG tablet Commonly known as:  ZOFRAN Take 1 tablet (4 mg total) by mouth every 6 (six) hours as needed for nausea.   rosuvastatin 5 MG tablet Commonly known as:  CRESTOR TAKE 1 TABLET DAILY HIGH CHOLESTEROL   senna 8.6 MG Tabs tablet Commonly known as:  SENOKOT Take 2 tablets (17.2 mg total) by mouth at bedtime.   sertraline 100 MG tablet Commonly known as:  ZOLOFT Take 1 tablet (100 mg total) by mouth daily.   sodium chloride 0.65 % Soln nasal spray Commonly known as:  OCEAN Place 1 spray into both nostrils 2 (two) times daily as needed for congestion.   tetrahydrozoline 0.05 % ophthalmic solution Place 1 drop into both eyes daily as needed (dry eyes).   Vitamin D (Ergocalciferol) 50000 units Caps capsule Commonly known as:  DRISDOL Take 1 capsule (50,000 Units total) by mouth See admin instructions. Takes every other week on Friday   vitamin E 400 UNIT capsule Take 400 Units by mouth 3 (three) times daily.   zinc gluconate 50 MG tablet Take 50 mg by mouth daily.            Discharge Care Instructions        Start     Ordered   01/22/17 0000  Call MD /  Call 911    Comments:  If you experience chest pain or shortness of breath, CALL 911 and be transported to the hospital emergency room.  If you develope a fever above 101 F, pus (white drainage) or increased drainage or redness at the wound, or calf pain, call your surgeon's office.   01/22/17 0757   01/22/17 0000  Diet - low sodium heart healthy     01/22/17 0757   01/22/17 0000  Constipation Prevention    Comments:  Drink plenty of fluids.  Prune juice may be helpful.  You may use a stool softener, such as Colace (over the counter) 100 mg twice a day.  Use MiraLax (over the counter) for constipation as needed.   01/22/17 0757   01/22/17 0000  Increase activity slowly as tolerated     01/22/17 0757   01/22/17 0000  Driving restrictions    Comments:  No driving for 6 weeks   01/22/17 0757   01/22/17 0000  Lifting restrictions    Comments:  No lifting for 6 weeks   01/22/17 0757   01/22/17 0000  TED hose    Comments:  Use stockings (TED hose) for 2 weeks on both leg(s).  You may remove them at night for sleeping.   01/22/17 0757   01/22/17 0000  Do not put a pillow under the knee. Place it under the heel.     01/22/17 0757   01/21/17 0000  aspirin 81 MG chewable tablet  2 times daily with meals     01/21/17 0904   01/21/17 0000  docusate sodium (COLACE) 100 MG capsule  2 times daily     01/21/17 0904   01/21/17 0000  HYDROcodone-acetaminophen (NORCO/VICODIN) 5-325 MG tablet  Every 4 hours PRN     01/21/17 0904   01/21/17 0000  ondansetron (ZOFRAN) 4 MG tablet  Every 6 hours PRN     01/21/17 0904   01/21/17 0000  senna (SENOKOT) 8.6 MG TABS tablet  Daily at bedtime     01/21/17 7829      Diagnostic Studies: Dg Knee Right Port  Result Date: 01/20/2017 CLINICAL DATA:  Status post right-sided total knee joint replacement. EXAM: PORTABLE RIGHT KNEE - 1-2 VIEW COMPARISON:  None in PACs FINDINGS: The patient has undergone right total knee joint prosthesis placement. Radiographic  positioning of the prosthetic components is good. The interface with the native bone appears normal. There is air in fluid within the anterior aspect of the joint space. IMPRESSION: No immediate postprocedure complication following right total knee joint prosthesis placement. These results will be called to the ordering clinician or representative by the Radiologist Assistant, and communication documented in the PACS or zVision Dashboard. Electronically Signed   By: David  Martinique M.D.   On: 01/20/2017 12:39    Disposition: 01-Home or Self Care  Discharge Instructions    Call MD / Call 911    Complete by:  As directed    If you experience chest pain or shortness of breath, CALL 911 and be transported to the hospital emergency room.  If you develope a fever above 101 F, pus (white drainage) or increased drainage or redness at the wound, or calf pain, call your surgeon's office.   Constipation Prevention    Complete by:  As directed    Drink plenty of fluids.  Prune juice may be helpful.  You may use a stool softener, such as Colace (over the counter) 100 mg twice a day.  Use MiraLax (over the counter) for constipation as needed.   Diet - low sodium heart healthy    Complete by:  As directed    Do not put a pillow under the knee. Place it under the heel.    Complete by:  As directed    Driving restrictions    Complete by:  As directed    No driving for 6 weeks   Increase activity slowly as tolerated  Complete by:  As directed    Lifting restrictions    Complete by:  As directed    No lifting for 6 weeks   TED hose    Complete by:  As directed    Use stockings (TED hose) for 2 weeks on both leg(s).  You may remove them at night for sleeping.      Follow-up Information    Marquisa Salih, Aaron Edelman, MD. Schedule an appointment as soon as possible for a visit in 2 weeks.   Specialty:  Orthopedic Surgery Why:  For wound re-check Contact information: [address] [place]  [address] [phone]            Signed: Elie Goody 01/22/2017, 7:57 AM

## 2017-01-21 NOTE — Evaluation (Signed)
Physical Therapy Evaluation Patient Details Name: Dawn West MRN: [id number] DOB: April 01, 1951 Today's Date: 01/21/2017   History of Present Illness  Pt s/p R TKR and with hx of L TKE 2/18  Clinical Impression  Pt s/p R TKR and presents with decreased R LE strength/ROM and post op pain limiting functional mobility.  Pt should progress to dc home with family assist.    Follow Up Recommendations Outpatient PT    Equipment Recommendations  None recommended by PT    Recommendations for Other Services       Precautions / Restrictions Precautions Precautions: Knee;Fall Restrictions Weight Bearing Restrictions: No Other Position/Activity Restrictions: WBAT      Mobility  Bed Mobility Overal bed mobility: Needs Assistance Bed Mobility: Supine to Sit     Supine to sit: Min guard     General bed mobility comments: min cues for sequence and use of L LE to self assist  Transfers Overall transfer level: Needs assistance Equipment used: Rolling walker (2 wheeled) Transfers: Sit to/from Stand Sit to Stand: Min guard         General transfer comment: cues for LE management and use of UEs to self assist  Ambulation/Gait Ambulation/Gait assistance: Min guard Ambulation Distance (Feet): 147 Feet Assistive device: Rolling walker (2 wheeled) Gait Pattern/deviations: Step-to pattern;Step-through pattern;Decreased step length - right;Decreased step length - left;Shuffle Gait velocity: decr Gait velocity interpretation: Below normal speed for age/gender General Gait Details: min cues for posture, position from RW and initial sequence  Stairs            Wheelchair Mobility    Modified Rankin (Stroke Patients Only)       Balance                                             Pertinent Vitals/Pain Pain Assessment: 0-10 Pain Score: 4  Pain Location: R knee Pain Descriptors / Indicators: Aching;Sore Pain Intervention(s): Limited activity within  patient's tolerance;Monitored during session;Premedicated before session;Ice applied    Home Living Family/patient expects to be discharged to:: Private residence Living Arrangements: Spouse/significant other Available Help at Discharge: Family Type of Home: House Home Access: Stairs to enter Entrance Stairs-Rails: None Entrance Stairs-Number of Steps: 3 Home Layout: Able to live on main level with bedroom/bathroom Home Equipment: Walker - 2 wheels;Bedside commode;Cane - single point      Prior Function Level of Independence: Needs assistance   Gait / Transfers Assistance Needed: utilizing cane or RW as needed           Hand Dominance   Dominant Hand: Right    Extremity/Trunk Assessment   Upper Extremity Assessment Upper Extremity Assessment: Overall WFL for tasks assessed    Lower Extremity Assessment Lower Extremity Assessment: RLE deficits/detail RLE Deficits / Details: 3/5 quads with IND SLR; AAROM at knee -10 - 80    Cervical / Trunk Assessment Cervical / Trunk Assessment: Normal  Communication   Communication: No difficulties  Cognition Arousal/Alertness: Awake/alert Behavior During Therapy: WFL for tasks assessed/performed Overall Cognitive Status: Within Functional Limits for tasks assessed                                        General Comments      Exercises Total Joint Exercises Ankle Circles/Pumps: AROM;Both;15  reps;Supine Quad Sets: AROM;Both;10 reps;Supine Heel Slides: AAROM;Right;15 reps;Supine Straight Leg Raises: AAROM;AROM;Right;15 reps;Supine   Assessment/Plan    PT Assessment Patient needs continued PT services  PT Problem List Decreased strength;Decreased range of motion;Decreased activity tolerance;Decreased coordination;Decreased knowledge of use of DME;Pain       PT Treatment Interventions DME instruction;Gait training;Stair training;Functional mobility training;Therapeutic activities;Therapeutic  exercise;Patient/family education    PT Goals (Current goals can be found in the Care Plan section)  Acute Rehab PT Goals Patient Stated Goal: Regain IND and walk without pain PT Goal Formulation: With patient Time For Goal Achievement: 01/24/17 Potential to Achieve Goals: Good    Frequency 7X/week   Barriers to discharge        Co-evaluation               AM-PAC PT "6 Clicks" Daily Activity  Outcome Measure Difficulty turning over in bed (including adjusting bedclothes, sheets and blankets)?: A Lot Difficulty moving from lying on back to sitting on the side of the bed? : A Lot Difficulty sitting down on and standing up from a chair with arms (e.g., wheelchair, bedside commode, etc,.)?: A Lot Help needed moving to and from a bed to chair (including a wheelchair)?: A Little Help needed walking in hospital room?: A Little Help needed climbing 3-5 steps with a railing? : A Little 6 Click Score: 15    End of Session Equipment Utilized During Treatment: Gait belt Activity Tolerance: Patient tolerated treatment well Patient left: in chair;with call bell/phone within reach;with family/visitor present Nurse Communication: Mobility status PT Visit Diagnosis: Difficulty in walking, not elsewhere classified (R26.2)    Time: 1245-8099 PT Time Calculation (min) (ACUTE ONLY): 26 min   Charges:   PT Evaluation $PT Eval Low Complexity: 1 Low PT Treatments $Therapeutic Exercise: 8-22 mins   PT G Codes:        Pg [phone]   Beuna Bolding 01/21/2017, 12:33 PM

## 2017-01-21 NOTE — Progress Notes (Signed)
Physical Therapy Treatment Patient Details Name: Dawn West MRN: [id number] DOB: [birth year] Today's Date: 01/21/2017    History of Present Illness Pt s/p R TKR and with hx of L TKE 2/18    PT Comments    Pt with min pain and progressing well with mobility.   Follow Up Recommendations  Outpatient PT     Equipment Recommendations  None recommended by PT    Recommendations for Other Services       Precautions / Restrictions Precautions Precautions: Knee;Fall Restrictions Weight Bearing Restrictions: No Other Position/Activity Restrictions: WBAT    Mobility  Bed Mobility Overal bed mobility: Needs Assistance Bed Mobility: Supine to Sit;Sit to Supine     Supine to sit: Supervision Sit to supine: Supervision   General bed mobility comments: min cues for sequence and use of L LE to self assist  Transfers Overall transfer level: Needs assistance Equipment used: Rolling walker (2 wheeled) Transfers: Sit to/from Stand Sit to Stand: Min guard;Supervision         General transfer comment: cues for LE management and use of UEs to self assist  Ambulation/Gait Ambulation/Gait assistance: Min guard;Supervision Ambulation Distance (Feet): 200 Feet Assistive device: Rolling walker (2 wheeled) Gait Pattern/deviations: Step-to pattern;Step-through pattern;Decreased step length - right;Decreased step length - left;Shuffle Gait velocity: decr Gait velocity interpretation: Below normal speed for age/gender General Gait Details: min cues for posture, position from RW and initial sequence   Stairs Stairs: Yes   Stair Management: No rails;Step to pattern;Forwards;With walker Number of Stairs: 3 General stair comments: min cues for sequence  Wheelchair Mobility    Modified Rankin (Stroke Patients Only)       Balance                                            Cognition Arousal/Alertness: Awake/alert Behavior During Therapy: WFL for tasks  assessed/performed Overall Cognitive Status: Within Functional Limits for tasks assessed                                        Exercises Total Joint Exercises Ankle Circles/Pumps: AROM;Both;15 reps;Supine Quad Sets: AROM;Both;10 reps;Supine Heel Slides: AAROM;Right;15 reps;Supine Straight Leg Raises: AAROM;AROM;Right;15 reps;Supine    General Comments        Pertinent Vitals/Pain Pain Assessment: 0-10 Pain Score: 3  Pain Location: R knee Pain Descriptors / Indicators: Aching;Sore Pain Intervention(s): Limited activity within patient's tolerance;Monitored during session;Premedicated before session;Ice applied    Home Living                      Prior Function            PT Goals (current goals can now be found in the care plan section) Acute Rehab PT Goals Patient Stated Goal: Regain IND and walk without pain PT Goal Formulation: With patient Time For Goal Achievement: 01/24/17 Potential to Achieve Goals: Good Progress towards PT goals: Progressing toward goals    Frequency    7X/week      PT Plan Current plan remains appropriate    Co-evaluation              AM-PAC PT "6 Clicks" Daily Activity  Outcome Measure  Difficulty turning over in bed (including adjusting bedclothes, sheets and blankets)?: A Lot Difficulty  moving from lying on back to sitting on the side of the bed? : A Little Difficulty sitting down on and standing up from a chair with arms (e.g., wheelchair, bedside commode, etc,.)?: A Little Help needed moving to and from a bed to chair (including a wheelchair)?: A Little Help needed walking in hospital room?: A Lot Help needed climbing 3-5 steps with a railing? : A Little 6 Click Score: 16    End of Session Equipment Utilized During Treatment: Gait belt Activity Tolerance: Patient tolerated treatment well Patient left: with call bell/phone within reach;with family/visitor present;in bed Nurse Communication:  Mobility status PT Visit Diagnosis: Difficulty in walking, not elsewhere classified (R26.2)     Time: 6301-6010 PT Time Calculation (min) (ACUTE ONLY): 23 min  Charges:  $Gait Training: 8-22 mins $Therapeutic Exercise: 8-22 mins                    G Codes:       Pg [phone]    Ajai Harville 01/21/2017, 5:52 PM

## 2017-01-21 NOTE — Progress Notes (Signed)
OT Cancellation Note  Patient Details Name: Dawn West MRN: [id number] DOB: 09/12/1950   Cancelled Treatment:    Reason Eval/Treat Not Completed: PT screened, no needs identified, will sign off  [name] 01/21/2017, 12:19 PM  Lesle Chris, OTR/L [phone] 01/21/2017

## 2017-01-21 NOTE — Progress Notes (Signed)
   Subjective:  Patient reports pain as mild to moderate.  Denies N/V/CP/SOB.  Wants ASA instead of apixaban.  Objective:   VITALS:   Vitals:   01/20/17 1746 01/20/17 2120 01/21/17 0201 01/21/17 0555  BP: (!) 112/53 (!) 119/50 (!) 120/52 139/65  Pulse: 77 81 73 70  Resp: 14 15 15 16   Temp: 98.1 F (36.7 C) 98.7 F (37.1 C) 98.5 F (36.9 C) 97.8 F (36.6 C)  TempSrc: Oral Oral Oral Oral  SpO2: 100% 99% 97% 100%  Weight:      Height:        NAD ABD soft Sensation intact distally Intact pulses distally Dorsiflexion/Plantar flexion intact Incision: dressing C/D/I Compartment soft   Lab Results  Component Value Date   WBC 10.6 (H) 01/21/2017   HGB 9.9 (L) 01/21/2017   HCT 29.0 (L) 01/21/2017   MCV 90.3 01/21/2017   PLT 210 01/21/2017   BMET    Component Value Date/Time   NA 139 01/21/2017 0459   K 4.1 01/21/2017 0459   CL 105 01/21/2017 0459   CO2 26 01/21/2017 0459   GLUCOSE 122 (H) 01/21/2017 0459   BUN 13 01/21/2017 0459   CREATININE 0.61 01/21/2017 0459   CREATININE 0.66 09/19/2015 0957   CALCIUM 9.0 01/21/2017 0459   GFRNONAA >60 01/21/2017 0459   GFRAA >60 01/21/2017 0459     Assessment/Plan: 1 Day Post-Op   Principal Problem:   Osteoarthritis of right knee   WBAT with walker PO pain control PT/OT DVT ppx: ASA, SCDs, TEDs Dispo: D/C home tomorrow, Rx on chart, outpatient PT scheduled for Monday   Elie Goody 01/21/2017, 8:48 AM   Rod Can, MD Cell [phone] `

## 2017-01-22 LAB — CBC
HCT: 28 % — ABNORMAL LOW (ref 36.0–46.0)
Hemoglobin: 9.6 g/dL — ABNORMAL LOW (ref 12.0–15.0)
MCH: 31.2 pg (ref 26.0–34.0)
MCHC: 34.3 g/dL (ref 30.0–36.0)
MCV: 90.9 fL (ref 78.0–100.0)
PLATELETS: 189 10*3/uL (ref 150–400)
RBC: 3.08 MIL/uL — ABNORMAL LOW (ref 3.87–5.11)
RDW: 12.3 % (ref 11.5–15.5)
WBC: 8.1 10*3/uL (ref 4.0–10.5)

## 2017-01-22 NOTE — Progress Notes (Signed)
   Subjective:  Patient reports pain as mild to moderate.  Denies N/V/CP/SOB.   Objective:   VITALS:   Vitals:   01/21/17 1037 01/21/17 1314 01/21/17 2224 01/22/17 0520  BP: (!) 117/56 (!) 144/58 135/72 (!) 150/67  Pulse: 65 77 66 62  Resp: 16 16 16 16   Temp: 97.8 F (36.6 C) 97.7 F (36.5 C) 98.4 F (36.9 C) 97.8 F (36.6 C)  TempSrc: Oral Oral Oral Oral  SpO2: 100% 100% 96% 98%  Weight:      Height:        NAD ABD soft Sensation intact distally Intact pulses distally Dorsiflexion/Plantar flexion intact Incision: dressing C/D/I Compartment soft   Lab Results  Component Value Date   WBC 8.1 01/22/2017   HGB 9.6 (L) 01/22/2017   HCT 28.0 (L) 01/22/2017   MCV 90.9 01/22/2017   PLT 189 01/22/2017   BMET    Component Value Date/Time   NA 139 01/21/2017 0459   K 4.1 01/21/2017 0459   CL 105 01/21/2017 0459   CO2 26 01/21/2017 0459   GLUCOSE 122 (H) 01/21/2017 0459   BUN 13 01/21/2017 0459   CREATININE 0.61 01/21/2017 0459   CREATININE 0.66 09/19/2015 0957   CALCIUM 9.0 01/21/2017 0459   GFRNONAA >60 01/21/2017 0459   GFRAA >60 01/21/2017 0459     Assessment/Plan: 2 Days Post-Op   Principal Problem:   Osteoarthritis of right knee   WBAT with walker PO pain control PT/OT DVT ppx: ASA, SCDs, TEDs Dispo: D/C home, outpatient PT scheduled for Monday   Elie Goody 01/22/2017, 7:56 AM   Rod Can, MD Cell [phone] `

## 2017-01-22 NOTE — Progress Notes (Signed)
Physical Therapy Treatment Patient Details Name: Dawn West MRN: [id number] DOB: [birth year] Today's Date: 01/22/2017    History of Present Illness Pt s/p R TKR and with hx of L TKE 2/18    PT Comments    Pt progressing well and eager for dc home.  Reviewed stairs and home therex with written instruction provided.   Follow Up Recommendations  Outpatient PT     Equipment Recommendations  None recommended by PT    Recommendations for Other Services OT consult     Precautions / Restrictions Precautions Precautions: Knee;Fall Restrictions Weight Bearing Restrictions: No Other Position/Activity Restrictions: WBAT    Mobility  Bed Mobility Overal bed mobility: Needs Assistance Bed Mobility: Supine to Sit     Supine to sit: Supervision        Transfers Overall transfer level: Needs assistance Equipment used: Rolling walker (2 wheeled) Transfers: Sit to/from Stand Sit to Stand: Supervision         General transfer comment: cues for LE management and use of UEs to self assist  Ambulation/Gait Ambulation/Gait assistance: Min guard;Supervision Ambulation Distance (Feet): 123 Feet Assistive device: Rolling walker (2 wheeled) Gait Pattern/deviations: Step-to pattern;Step-through pattern;Decreased step length - right;Decreased step length - left;Shuffle Gait velocity: decr Gait velocity interpretation: Below normal speed for age/gender General Gait Details: min cues for posture, position from RW and initial sequence   Stairs     Stair Management: No rails;Step to pattern;With walker;Backwards Number of Stairs: 2 General stair comments: min cues for sequence and foot/RW placement  Wheelchair Mobility    Modified Rankin (Stroke Patients Only)       Balance                                            Cognition Arousal/Alertness: Awake/alert Behavior During Therapy: WFL for tasks assessed/performed Overall Cognitive Status: Within  Functional Limits for tasks assessed                                        Exercises Total Joint Exercises Ankle Circles/Pumps: AROM;Both;15 reps;Supine Quad Sets: AROM;Both;Supine;15 reps Heel Slides: AAROM;Right;Supine;10 reps Straight Leg Raises: AAROM;AROM;Right;Supine;20 reps Goniometric ROM: AAROM at R knee -10 - 70    General Comments        Pertinent Vitals/Pain Pain Assessment: 0-10 Pain Score: 4  Pain Location: R knee Pain Descriptors / Indicators: Aching;Sore Pain Intervention(s): Limited activity within patient's tolerance;Monitored during session;Premedicated before session;Ice applied    Home Living                      Prior Function            PT Goals (current goals can now be found in the care plan section) Acute Rehab PT Goals Patient Stated Goal: Regain IND and walk without pain PT Goal Formulation: With patient Time For Goal Achievement: 01/24/17 Potential to Achieve Goals: Good Progress towards PT goals: Progressing toward goals    Frequency    7X/week      PT Plan Current plan remains appropriate    Co-evaluation              AM-PAC PT "6 Clicks" Daily Activity  Outcome Measure  Difficulty turning over in bed (including adjusting bedclothes, sheets and blankets)?: A  Lot Difficulty moving from lying on back to sitting on the side of the bed? : A Little Difficulty sitting down on and standing up from a chair with arms (e.g., wheelchair, bedside commode, etc,.)?: A Little Help needed moving to and from a bed to chair (including a wheelchair)?: A Little Help needed walking in hospital room?: A Little Help needed climbing 3-5 steps with a railing? : A Little 6 Click Score: 17    End of Session Equipment Utilized During Treatment: Gait belt Activity Tolerance: Patient tolerated treatment well Patient left: with call bell/phone within reach;with family/visitor present;in chair Nurse Communication: Mobility  status PT Visit Diagnosis: Difficulty in walking, not elsewhere classified (R26.2)     Time: 2060-1561 PT Time Calculation (min) (ACUTE ONLY): 30 min  Charges:  $Gait Training: 8-22 mins $Therapeutic Exercise: 8-22 mins                    G Codes:       Pg [phone]    Elvina Bosch 01/22/2017, 12:49 PM

## 2017-01-24 DIAGNOSIS — M1711 Unilateral primary osteoarthritis, right knee: Secondary | ICD-10-CM | POA: Diagnosis not present

## 2017-01-28 DIAGNOSIS — M1711 Unilateral primary osteoarthritis, right knee: Secondary | ICD-10-CM | POA: Diagnosis not present

## 2017-02-01 DIAGNOSIS — M1711 Unilateral primary osteoarthritis, right knee: Secondary | ICD-10-CM | POA: Diagnosis not present

## 2017-02-03 DIAGNOSIS — M1711 Unilateral primary osteoarthritis, right knee: Secondary | ICD-10-CM | POA: Diagnosis not present

## 2017-02-04 DIAGNOSIS — Z96651 Presence of right artificial knee joint: Secondary | ICD-10-CM | POA: Diagnosis not present

## 2017-02-04 DIAGNOSIS — Z471 Aftercare following joint replacement surgery: Secondary | ICD-10-CM | POA: Diagnosis not present

## 2017-02-04 DIAGNOSIS — M1712 Unilateral primary osteoarthritis, left knee: Secondary | ICD-10-CM | POA: Diagnosis not present

## 2017-02-07 DIAGNOSIS — M1711 Unilateral primary osteoarthritis, right knee: Secondary | ICD-10-CM | POA: Diagnosis not present

## 2017-02-08 DIAGNOSIS — M1711 Unilateral primary osteoarthritis, right knee: Secondary | ICD-10-CM | POA: Diagnosis not present

## 2017-02-09 DIAGNOSIS — M1711 Unilateral primary osteoarthritis, right knee: Secondary | ICD-10-CM | POA: Diagnosis not present

## 2017-02-10 DIAGNOSIS — M1711 Unilateral primary osteoarthritis, right knee: Secondary | ICD-10-CM | POA: Diagnosis not present

## 2017-02-11 DIAGNOSIS — M1711 Unilateral primary osteoarthritis, right knee: Secondary | ICD-10-CM | POA: Diagnosis not present

## 2017-02-14 DIAGNOSIS — M1711 Unilateral primary osteoarthritis, right knee: Secondary | ICD-10-CM | POA: Diagnosis not present

## 2017-02-16 DIAGNOSIS — M1711 Unilateral primary osteoarthritis, right knee: Secondary | ICD-10-CM | POA: Diagnosis not present

## 2017-02-21 DIAGNOSIS — Z471 Aftercare following joint replacement surgery: Secondary | ICD-10-CM | POA: Diagnosis not present

## 2017-02-21 DIAGNOSIS — M1711 Unilateral primary osteoarthritis, right knee: Secondary | ICD-10-CM | POA: Diagnosis not present

## 2017-02-21 DIAGNOSIS — Z96651 Presence of right artificial knee joint: Secondary | ICD-10-CM | POA: Diagnosis not present

## 2017-02-23 DIAGNOSIS — M1711 Unilateral primary osteoarthritis, right knee: Secondary | ICD-10-CM | POA: Diagnosis not present

## 2017-02-25 ENCOUNTER — Encounter: Payer: Medicare Other | Admitting: Adult Health

## 2017-03-01 DIAGNOSIS — M1711 Unilateral primary osteoarthritis, right knee: Secondary | ICD-10-CM | POA: Diagnosis not present

## 2017-03-03 DIAGNOSIS — M1711 Unilateral primary osteoarthritis, right knee: Secondary | ICD-10-CM | POA: Diagnosis not present

## 2017-03-08 DIAGNOSIS — M1711 Unilateral primary osteoarthritis, right knee: Secondary | ICD-10-CM | POA: Diagnosis not present

## 2017-03-10 DIAGNOSIS — M1711 Unilateral primary osteoarthritis, right knee: Secondary | ICD-10-CM | POA: Diagnosis not present

## 2017-05-20 DIAGNOSIS — L812 Freckles: Secondary | ICD-10-CM | POA: Diagnosis not present

## 2017-05-20 DIAGNOSIS — L821 Other seborrheic keratosis: Secondary | ICD-10-CM | POA: Diagnosis not present

## 2017-05-20 DIAGNOSIS — L814 Other melanin hyperpigmentation: Secondary | ICD-10-CM | POA: Diagnosis not present

## 2017-05-20 DIAGNOSIS — D1801 Hemangioma of skin and subcutaneous tissue: Secondary | ICD-10-CM | POA: Diagnosis not present

## 2017-05-20 DIAGNOSIS — Z85828 Personal history of other malignant neoplasm of skin: Secondary | ICD-10-CM | POA: Diagnosis not present

## 2017-05-31 ENCOUNTER — Ambulatory Visit
Admission: RE | Admit: 2017-05-31 | Discharge: 2017-05-31 | Disposition: A | Discharge status: Home / Self Care | Payer: Medicare Other | Source: Ambulatory Visit | Attending: Hematology | Admitting: Hematology

## 2017-05-31 DIAGNOSIS — Z171 Estrogen receptor negative status [ER-]: Secondary | ICD-10-CM

## 2017-05-31 DIAGNOSIS — C50411 Malignant neoplasm of upper-outer quadrant of right female breast: Secondary | ICD-10-CM

## 2017-05-31 DIAGNOSIS — R922 Inconclusive mammogram: Secondary | ICD-10-CM | POA: Diagnosis not present

## 2017-05-31 HISTORY — DX: Personal history of irradiation: Z92.3

## 2017-06-14 DIAGNOSIS — Z171 Estrogen receptor negative status [ER-]: Secondary | ICD-10-CM | POA: Diagnosis not present

## 2017-06-14 DIAGNOSIS — C50211 Malignant neoplasm of upper-inner quadrant of right female breast: Secondary | ICD-10-CM | POA: Diagnosis not present

## 2017-06-30 DIAGNOSIS — E559 Vitamin D deficiency, unspecified: Secondary | ICD-10-CM | POA: Diagnosis not present

## 2017-06-30 DIAGNOSIS — E785 Hyperlipidemia, unspecified: Secondary | ICD-10-CM | POA: Diagnosis not present

## 2017-07-12 DIAGNOSIS — M1712 Unilateral primary osteoarthritis, left knee: Secondary | ICD-10-CM | POA: Diagnosis not present

## 2017-07-12 DIAGNOSIS — M1711 Unilateral primary osteoarthritis, right knee: Secondary | ICD-10-CM | POA: Diagnosis not present

## 2017-07-12 DIAGNOSIS — M170 Bilateral primary osteoarthritis of knee: Secondary | ICD-10-CM | POA: Diagnosis not present

## 2017-08-09 DIAGNOSIS — Z1211 Encounter for screening for malignant neoplasm of colon: Secondary | ICD-10-CM | POA: Diagnosis not present

## 2017-08-09 DIAGNOSIS — Z86010 Personal history of colonic polyps: Secondary | ICD-10-CM | POA: Diagnosis not present

## 2017-08-09 DIAGNOSIS — K5730 Diverticulosis of large intestine without perforation or abscess without bleeding: Secondary | ICD-10-CM | POA: Diagnosis not present

## 2017-08-17 DIAGNOSIS — K5730 Diverticulosis of large intestine without perforation or abscess without bleeding: Secondary | ICD-10-CM | POA: Diagnosis not present

## 2017-08-17 DIAGNOSIS — Z1211 Encounter for screening for malignant neoplasm of colon: Secondary | ICD-10-CM | POA: Diagnosis not present

## 2017-08-17 DIAGNOSIS — D122 Benign neoplasm of ascending colon: Secondary | ICD-10-CM | POA: Diagnosis not present

## 2017-08-17 DIAGNOSIS — K635 Polyp of colon: Secondary | ICD-10-CM | POA: Diagnosis not present

## 2017-08-17 DIAGNOSIS — Z86010 Personal history of colonic polyps: Secondary | ICD-10-CM | POA: Diagnosis not present

## 2017-09-21 DIAGNOSIS — R300 Dysuria: Secondary | ICD-10-CM | POA: Diagnosis not present

## 2017-09-21 DIAGNOSIS — F419 Anxiety disorder, unspecified: Secondary | ICD-10-CM | POA: Diagnosis not present

## 2017-09-21 DIAGNOSIS — M19049 Primary osteoarthritis, unspecified hand: Secondary | ICD-10-CM | POA: Diagnosis not present

## 2017-09-30 ENCOUNTER — Ambulatory Visit: Payer: Medicare Other | Admitting: Nurse Practitioner

## 2017-10-10 ENCOUNTER — Telehealth: Payer: Self-pay | Admitting: Hematology

## 2017-10-10 NOTE — Telephone Encounter (Signed)
Spoke w/ pts husband re rescheduling appt on 7/24 from pm to am.  Pt sleeping.  Called back to confirm rescheduled appt but unable to leave message on machine.  Mailed calendar.

## 2017-10-28 DIAGNOSIS — H2513 Age-related nuclear cataract, bilateral: Secondary | ICD-10-CM | POA: Diagnosis not present

## 2017-10-28 DIAGNOSIS — H04123 Dry eye syndrome of bilateral lacrimal glands: Secondary | ICD-10-CM | POA: Diagnosis not present

## 2017-10-28 DIAGNOSIS — H43813 Vitreous degeneration, bilateral: Secondary | ICD-10-CM | POA: Diagnosis not present

## 2017-11-29 NOTE — Progress Notes (Signed)
[facility]  [phone] [phone]  Clinic Follow-up Note   Patient Care Team: Pa, Reid as PCP - General (Family Medicine) Kem Boroughs, [place] as Nurse Practitioner (Family Medicine) [name], MD as Consulting Physician (General Surgery) Truitt Merle, MD as Consulting Physician (Hematology) Gery Pray, MD as Consulting Physician (Radiation Oncology)   Date of Service:  11/30/2017  CHIEF COMPLAINTS:  Follow up right breast cancer stage IA  Oncology History   Cancer Staging Breast cancer of upper-outer quadrant of right female breast [facility]) Staging form: Breast, AJCC 8th Edition - Clinical: Stage 0 (cTis (DCIS), cN0, cM0, ER: Negative, PR: Negative) - Signed by Truitt Merle, MD on 05/25/2016 - Pathologic stage from 06/02/2016: Stage IA (pT1a, pN0, cM0, G2, ER: Negative, PR: Negative, HER2: Positive) - Signed by Truitt Merle, MD on 11/29/2016       Breast cancer of upper-outer quadrant of right female breast ([place])   05/18/2016 Mammogram    Diagnostic mammogram of the right breast showed heterogeneous calcifications in a linear orientation within the posterior upper right breast and spanning a distance of 2.2 cm. No associated mass identified.      05/19/2016 Initial Biopsy    Right breast core needle biopsy showed ductal carcinoma with calcification, high-grade, focal highly suspicious for stromal invasion.      05/19/2016 Receptors her2    ER negative, PR negative      05/19/2016 Initial Diagnosis    Ductal carcinoma in situ (DCIS) of right breast      06/02/2016 Surgery    RIGHT BREAST LUMPECTOMY WITH RADIOACTIVE SEED AND SENTINEL LYMPH NODE BIOPSY by Dr. Barry Dienes      06/02/2016 Pathology Results    Diagnosis 06/02/16 1. Breast, lumpectomy, Right w/seed - INVASIVE DUCTAL CARCINOMA, GRADE 2, SPANNING 0.3 CM. - HIGH GRADE DUCTAL CARCINOMA IN SITU WITH NECROSIS. - INVASIVE CARCINOMA COMES TO WITHIN <0.1 CM OF THE  POSTERIOR MARGIN FOCALLY. - DUCTAL CARCINOMA IN SITU COMES TO WITHIN <0.1 CM OF THE POSTERIOR MARGIN FOCALLY AND THE SUPERIOR MARGIN FOCALLY. - BIOPSY SITE. - SEE ONCOLOGY TABLE. 2. Breast, excision, Right additional Medial Margin - FIBROCYSTIC CHANGE. - USUAL DUCTAL HYPERPLASIA. - NO MALIGNANCY IDENTIFIED. 3. Lymph node, sentinel, biopsy, Right Axillary #1 - ONE OF ONE LYMPH NODES NEGATIVE FOR CARCINOMA (0/1). 4. Lymph node, sentinel, biopsy, Right Axillary - ONE OF ONE LYMPH NODES NEGATIVE FOR CARCINOMA (0/1).      06/02/2016 Receptors her2    ER-, PR-, HER2+       07/26/2016 Surgery    RE-EXCISION OF RIGHT BREAST LUMPECTOMY by Dr. Barry Dienes      07/26/2016 Pathology Results    Diagnosis 07/26/16 Breast, excision, Right new Superior Margin - DUCTAL CARCINOMA IN SITU WITH CALCIFICATIONS, HIGH GRADE, SPANNING AT LEAST 1.5 CM. - FOCI HIGHLY SUSPICIOUS FOR EARLY STROMAL INVASION. - DUCTAL CARCINOMA IN SITU IS BROADLY LESS THAN 0.1 CM TO THE NEW SUPERIOR MARGIN. - SEE COMMENT.      08/26/2016 Pathology Results    Diagnosis 08/26/16 Breast, right, needle core biopsy, superior anterior - HIGH GRADE, DUCTAL CARCINOMA IN SITU - SEE COMMENT      09/06/2016 Surgery    RE-EXCISION OF RIGHT BREAST LUMPECTOMY WITH RADIOACTIVE SEED LOCALIZATION by Dr. Barry Dienes      09/06/2016 Pathology Results    Diagnosis 09/06/16 1. Breast, excision, Right Superior Margin - INVASIVE DUCTAL CARCINOMA, GRADE III/III, SCATTERED MICROSCOPIC FOCI. - DUCTAL CARCINOMA IN SITU WITH CALCIFICATIONS, HIGH GRADE. - INVASIVE CARCINOMA  IS FOCALLY LESS THAN 0.1 CM TO THE INFERIOR MARGIN OF SPECIMEN #1.. - DUCTAL CARCINOMA IN SITU IS BROADLY LESS THAN 0.1 CM TO THE LATERAL MARGIN, FOCALLY LESS THAN 0.1 CM TO THE INFERIOR MARGIN, AND FOCALLY LESS THAN 0.1 CM TO THE ANTERIOR MARGIN OF SPECIMEN #1. - SEE COMMENT. 2. Breast, excision, Right Superolateral Margin - FIBROCYSTIC CHANGES. - THERE IS NO EVIDENCE OF MALIGNANCY. -  SEE COMMENT.      10/13/2016 - 11/30/2016 Radiation Therapy    Radiation by Dr. Sondra Come  Radiation treatment dates:   10/13/16 - 11/30/16 Site/dose:    1) Right Breast treated to 50.4 Gy in 28 fractions 2) Right Breast Boosted an additional 12 Gy in 6 fractions             Beams/energy:    1) 3D  //  6X 2) 3D  //  6X Narrative: The patient tolerated radiation treatment relatively well. The patient experienced occasional mild pain in her right breast. She also reported some fatigue and appetite reduction throughout treatment, though this did not affect her activities of daily living. She experienced some radiation related skin changes including moderate erythema across the right breast.       05/31/2017 Mammogram    Diagnostic Mammogram 05/31/17 IMPRESSION: No mammographic evidence of malignancy. RECOMMENDATION: Annual diagnostic mammography.        HISTORY OF PRESENTING ILLNESS: 05/26/16 Dawn West 67 y.o. female is here because of Her recently diagnosed right breast DCIS. She presents to our multidisciplinary breast clinic with her husband today.  This was discovered by screening mammogram, she is in no palpable breast mass, and denies any new symptoms lately. Her diagnostic mammogram on 10/16/2016 showed heterogeneous calcification in the right breast spanning 2.2 cm. Core needle biopsy of the right breast lesion showed high-grade DCIS, focally suspicious for stromal invasion. ER/PR negative.   She has chronic bilateral knee pain from arthritis for the past 3 years, he has been getting much worse since a month ago. She is scheduled to have a total left knee replacement on 06/10/2016, she walks with a walker. No other arthritis or other complaints.  GYN HISTORY  Menarchal:12 LMP: age of 63 before hysterectomy  Contraceptive: 20 years HRT: 11 years, will stop now  G1P1: 67 yo daughter     CURRENT THERAPY:  Surveillance    INTERVAL HISTORY:  Dawn West is here for a follow  up of her right breast cancer. She was last seen by me 1 year ago. In interim she has a total right knee replacement surgery in 01/2017.  She presents to the clinic today by herself. She has been doing well. She notes right breast sensitivity when touch or pressure is applied. She saw Dr. Barry Dienes in 06/2017. She plans to see her new PCP in 01/2018 who will also do her pelvic exams.    She notes her husband was recently diagnosed with acute leukocytosis. He has not been tolerating chemo well. She has been taking care of him.     MEDICAL HISTORY:  Past Medical History:  Diagnosis Date  . Anxiety   . Arthritis    knees  . Cancer ([place]) 12/01/11   basal cell skin ca Left upper arm  . Cancer ([place]) 04/20/2016   right breast cancer  . Complication of anesthesia   . Depression   . Headache   . History of radiation therapy 10/13/16-11/30/16   right breast 50.4 Gy in 28 fractions, right breast boost 12  Gy in 6 fractions  . Hypercholesteremia   . Personal history of radiation therapy   . PONV (postoperative nausea and vomiting)     SURGICAL HISTORY: Past Surgical History:  Procedure Laterality Date  . ABDOMINAL HYSTERECTOMY    . BREAST LUMPECTOMY Right   . BREAST LUMPECTOMY WITH RADIOACTIVE SEED AND SENTINEL LYMPH NODE BIOPSY Right 06/02/2016   Procedure: RIGHT BREAST LUMPECTOMY WITH RADIOACTIVE SEED AND SENTINEL LYMPH NODE BIOPSY;  Surgeon: [name], MD;  Location: [place];  Service: General;  Laterality: Right;  . BREAST LUMPECTOMY WITH RADIOACTIVE SEED LOCALIZATION Right 09/06/2016   Procedure: RE-EXCISION OF RIGHT BREAST LUMPECTOMY WITH RADIOACTIVE SEED LOCALIZATION;  Surgeon: [name], MD;  Location: [place];  Service: General;  Laterality: Right;  . BREAST SURGERY  05/19/2016   right breast biopsy  . KNEE ARTHROPLASTY Left 06/10/2016   Procedure: LEFT TOTAL KNEE ARTHROPLASTY WITH COMPUTER NAVIGATION;  Surgeon: Rod Can, MD;  Location: [facility];   Service: Orthopedics;  Laterality: Left;  . KNEE ARTHROPLASTY Right 01/20/2017   Procedure: RIGHT TOTAL KNEE ARTHROPLASTY WITH COMPUTER NAVIGATION;  Surgeon: Rod Can, MD;  Location: [facility];  Service: Orthopedics;  Laterality: Right;  Adductor Block  . MOUTH SURGERY    . RE-EXCISION OF BREAST LUMPECTOMY Right 07/26/2016   Procedure: RE-EXCISION OF RIGHT BREAST LUMPECTOMY;  Surgeon: [name], MD;  Location: [place];  Service: General;  Laterality: Right;  . SKIN CANCER EXCISION Left 2013   upper arm, basal cell  . TONSILLECTOMY AND ADENOIDECTOMY     (DEVIATED SEPTUM  SAME TIME)  . TOTAL VAGINAL HYSTERECTOMY  12/1996   menorrhagia, fibroids    SOCIAL HISTORY: Social History   Socioeconomic History  . Marital status: Married    Spouse name: Not on file  . Number of children: 1  . Years of education: Not on file  . Highest education level: Not on file  Occupational History  . Not on file  Social Needs  . Financial resource strain: Not on file  . Food insecurity:    Worry: Not on file    Inability: Not on file  . Transportation needs:    Medical: Not on file    Non-medical: Not on file  Tobacco Use  . Smoking status: Never Smoker  . Smokeless tobacco: Never Used  Substance and Sexual Activity  . Alcohol use: Yes    Alcohol/week: 0.0 oz    Comment: 3 times yearly  . Drug use: No  . Sexual activity: Yes    Partners: Male    Birth control/protection: Post-menopausal, Surgical    Comment: Hysterectomy  Lifestyle  . Physical activity:    Days per week: Not on file    Minutes per session: Not on file  . Stress: Not on file  Relationships  . Social connections:    Talks on phone: Not on file    Gets together: Not on file    Attends religious service: Not on file    Active member of club or organization: Not on file    Attends meetings of clubs or organizations: Not on file    Relationship status: Not on file  . Intimate partner violence:    Fear of current or ex  partner: Not on file    Emotionally abused: Not on file    Physically abused: Not on file    Forced sexual activity: Not on file  Other Topics Concern  . Not on file  Social History Narrative  .  Not on file    FAMILY HISTORY: Family History  Problem Relation Age of Onset  . Hypertension Mother   . Heart failure Mother   . Diabetes Mother   . Stroke Mother   . COPD Mother   . Hyperlipidemia Mother   . Hypertension Father   . Heart failure Father   . Hypertension Brother   . Hypertension Sister   . Hyperlipidemia Sister   . Hypertension Brother   . Hypertension Brother   . Diabetes Brother     ALLERGIES:  is allergic to allegra [fexofenadine hcl]; ciprofloxacin; estradiol; oxycodone; penicillins; and sulfa antibiotics.  MEDICATIONS:  Current Outpatient Medications  Medication Sig Dispense Refill  . Calcium Carb-Cholecalciferol (CALCIUM 600 + D) 600-200 MG-UNIT TABS Take 1 tablet by mouth 2 (two) times daily.     . cetirizine (ZYRTEC) 10 MG tablet Take 10 mg by mouth at bedtime. At 10 pm daily    . diphenhydrAMINE (EQ NIGHTTIME SLEEP AID) 25 MG tablet Take 25 mg by mouth at bedtime as needed for sleep.    Marland Kitchen docusate sodium (COLACE) 100 MG capsule Take 1 capsule (100 mg total) by mouth 2 (two) times daily. 60 capsule 1  . fluticasone (FLONASE) 50 MCG/ACT nasal spray Place 1 spray into both nostrils daily.     . Glucos-Chond-Hyal Ac-Ca Fructo (MOVE FREE JOINT HEALTH ADVANCE PO) Take 1 tablet by mouth daily.    . Magnesium 250 MG TABS Take 250 mg by mouth 3 (three) times daily.     . Multiple Vitamin (MULTIVITAMIN) tablet Take 1 tablet by mouth daily.    . Omega-3 Fatty Acids (FISH OIL) 1000 MG CAPS Take 1,000 mg by mouth 3 (three) times daily. Takes 1-3 times daily    . rosuvastatin (CRESTOR) 5 MG tablet TAKE 1 TABLET DAILY HIGH CHOLESTEROL  0  . senna (SENOKOT) 8.6 MG TABS tablet Take 2 tablets (17.2 mg total) by mouth at bedtime. 120 each 0  . sertraline (ZOLOFT) 100 MG  tablet Take 1 tablet (100 mg total) by mouth daily. (Patient taking differently: Take 100 mg by mouth daily. ) 90 tablet 4  . sodium chloride (OCEAN) 0.65 % SOLN nasal spray Place 1 spray into both nostrils 2 (two) times daily as needed for congestion.    Marland Kitchen tetrahydrozoline 0.05 % ophthalmic solution Place 1 drop into both eyes daily as needed (dry eyes).    . Vitamin D, Ergocalciferol, (DRISDOL) 50000 units CAPS capsule Take 1 capsule (50,000 Units total) by mouth See admin instructions. Takes every other week on Friday 30 capsule 2  . vitamin E 400 UNIT capsule Take 400 Units by mouth 3 (three) times daily.     Marland Kitchen zinc gluconate 50 MG tablet Take 50 mg by mouth daily.    Marland Kitchen aspirin 81 MG chewable tablet Chew 1 tablet (81 mg total) by mouth 2 (two) times daily with a meal. (Patient not taking: Reported on 11/30/2017) 60 tablet 1  . diclofenac (VOLTAREN) 75 MG EC tablet Take 75 mg by mouth 2 (two) times daily.    . hyaluronate sodium (RADIAPLEXRX) GEL Apply 1 application topically 2 (two) times daily.    Marland Kitchen HYDROcodone-acetaminophen (NORCO/VICODIN) 5-325 MG tablet Take 1-2 tablets by mouth every 4 (four) hours as needed (breakthrough pain). (Patient not taking: Reported on 11/30/2017) 60 tablet 0  . ondansetron (ZOFRAN) 4 MG tablet Take 1 tablet (4 mg total) by mouth every 6 (six) hours as needed for nausea. (Patient not taking: Reported on 11/30/2017)  20 tablet 0   No current facility-administered medications for this visit.     REVIEW OF SYSTEMS:   Constitutional: Denies fevers, chills or abnormal night sweats   Eyes: Denies blurriness of vision, double vision or watery eyes Ears, nose, mouth, throat, and face: Denies mucositis or sore throat Respiratory: Denies cough, dyspnea or wheezes Cardiovascular: Denies palpitation, chest discomfort or lower extremity swelling Gastrointestinal:  Denies nausea, heartburn or change in bowel habits Skin: Denies abnormal skin rashes Lymphatics: Denies new  lymphadenopathy or easy bruising Neurological:Denies numbness, tingling or new weaknesses Breast: (+) Sensitivity and tenderness to right breast  Behavioral/Psych: Mood is stable, no new changes  All other systems were reviewed with the patient and are negative.  PHYSICAL EXAMINATION: ECOG PERFORMANCE STATUS: 1  Vitals:   11/30/17 0906  BP: (!) 150/67  Pulse: 75  Resp: 18  Temp: 98.8 F (37.1 C)  SpO2: 98%   Filed Weights   11/30/17 0906  Weight: 150 lb 6.4 oz (68.2 kg)    GENERAL:alert, no distress and comfortable SKIN: skin color, texture, turgor are normal, no rashes or significant lesions EYES: normal, conjunctiva are pink and non-injected, sclera clear OROPHARYNX:no exudate, no erythema and lips, buccal mucosa, and tongue normal  NECK: supple, thyroid normal size, non-tender, without nodularity LYMPH:  no palpable lymphadenopathy in the cervical, axillary or inguinal LUNGS: clear to auscultation and percussion with normal breathing effort HEART: regular rate & rhythm and no murmurs and no lower extremity edema ABDOMEN:abdomen soft, non-tender and normal bowel sounds Musculoskeletal:no cyanosis of digits and no clubbing  PSYCH: alert & oriented x 3 with fluent speech NEURO: no focal motor/sensory deficits Breasts: Breast inspection showed them to be symmetrical with no nipple discharge. Palpation of the breasts and axilla revealed no obvious mass that I could appreciate. S/p right breast lumpectomy: Surgical scar in right axilla and around aurora, healed well with mild scar tissue. Moderate tenderness, no palpable mass or adenopathy.    LABORATORY DATA:  I have reviewed the data as listed CBC Latest Ref Rng & Units 11/30/2017 01/22/2017 01/21/2017  WBC 3.9 - 10.3 K/uL 6.7 8.1 10.6(H)  Hemoglobin 11.6 - 15.9 g/dL 10.4(L) 9.6(L) 9.9(L)  Hematocrit 34.8 - 46.6 % 30.8(L) 28.0(L) 29.0(L)  Platelets 145 - 400 K/uL 515(H) 189 210   CMP Latest Ref Rng & Units 11/30/2017  01/21/2017 07/20/2016  Glucose 70 - 99 mg/dL 100(H) 122(H) 103(H)  BUN 8 - 23 mg/dL 26(H) 13 14  Creatinine 0.44 - 1.00 mg/dL 0.98 0.61 0.85  Sodium 135 - 145 mmol/L 140 139 139  Potassium 3.5 - 5.1 mmol/L 4.3 4.1 4.2  Chloride 98 - 111 mmol/L 103 105 100(L)  CO2 22 - 32 mmol/L '26 26 26  '$ Calcium 8.9 - 10.3 mg/dL 9.9 9.0 9.9  Total Protein 6.5 - 8.1 g/dL 7.5 - -  Total Bilirubin 0.3 - 1.2 mg/dL <0.2(L) - -  Alkaline Phos 38 - 126 U/L 56 - -  AST 15 - 41 U/L 16 - -  ALT 0 - 44 U/L 24 - -   PATHOLOGY REPORT   Diagnosis 09/06/16 1. Breast, excision, Right Superior Margin - INVASIVE DUCTAL CARCINOMA, GRADE III/III, SCATTERED MICROSCOPIC FOCI. - DUCTAL CARCINOMA IN SITU WITH CALCIFICATIONS, HIGH GRADE. - INVASIVE CARCINOMA IS FOCALLY LESS THAN 0.1 CM TO THE INFERIOR MARGIN OF SPECIMEN #1.. - DUCTAL CARCINOMA IN SITU IS BROADLY LESS THAN 0.1 CM TO THE LATERAL MARGIN, FOCALLY LESS THAN 0.1 CM TO THE INFERIOR MARGIN, AND FOCALLY LESS  THAN 0.1 CM TO THE ANTERIOR MARGIN OF SPECIMEN #1. - SEE COMMENT. 2. Breast, excision, Right Superolateral Margin - FIBROCYSTIC CHANGES. - THERE IS NO EVIDENCE OF MALIGNANCY. - SEE COMMENT. Microscopic Comment 1. Immunohistochemical stains for smooth muscle myosin, calponin, p63, and cytokeratin AE1/AE3 performed on multiple block highlight the presence of invasive caricnoma. (JBK:gt, 09/09/16) 2. The surgical resection margin(s) of the specimen were inked and microscopically evaluated.   Diagnosis 08/26/16 Breast, right, needle core biopsy, superior anterior - HIGH GRADE, DUCTAL CARCINOMA IN SITU - SEE COMMENT Microscopic Comment The biopsy material has high grade ductal carcinoma in situ with foci suspicious for invasion. Prognostic markers were not repeated on this sample but can be performed at clinician's request. Dr. Tresa Moore has reviewed the case and agrees with the above diagnosis. The above results were called to [facility] on August 27, 2016.  Diagnosis 07/26/16 Breast, excision, Right new Superior Margin - DUCTAL CARCINOMA IN SITU WITH CALCIFICATIONS, HIGH GRADE, SPANNING AT LEAST 1.5 CM. - FOCI HIGHLY SUSPICIOUS FOR EARLY STROMAL INVASION. - DUCTAL CARCINOMA IN SITU IS BROADLY LESS THAN 0.1 CM TO THE NEW SUPERIOR MARGIN. - SEE COMMENT. Microscopic Comment High grade ductal carcinoma in situ is present in multiple blocks submitted for histologic evaluation and spans likely at least 1.5 cm. In addition, there are a few scattered microscopic foci highly suspicious for early invasive carcinoma. The ductal carcinoma in situ is broadly less than 0.1 cm to the new superior margin of the specimen. (JBK:kh 07-27-16)  Diagnosis 06/02/16 1. Breast, lumpectomy, Right w/seed - INVASIVE DUCTAL CARCINOMA, GRADE 2, SPANNING 0.3 CM. - HIGH GRADE DUCTAL CARCINOMA IN SITU WITH NECROSIS. - INVASIVE CARCINOMA COMES TO WITHIN <0.1 CM OF THE POSTERIOR MARGIN FOCALLY. - DUCTAL CARCINOMA IN SITU COMES TO WITHIN <0.1 CM OF THE POSTERIOR MARGIN FOCALLY AND THE SUPERIOR MARGIN FOCALLY. - BIOPSY SITE. - SEE ONCOLOGY TABLE. 2. Breast, excision, Right additional Medial Margin - FIBROCYSTIC CHANGE. - USUAL DUCTAL HYPERPLASIA. - NO MALIGNANCY IDENTIFIED. 3. Lymph node, sentinel, biopsy, Right Axillary #1 - ONE OF ONE LYMPH NODES NEGATIVE FOR CARCINOMA (0/1). 4. Lymph node, sentinel, biopsy, Right Axillary - ONE OF ONE LYMPH NODES NEGATIVE FOR CARCINOMA (0/1). Microscopic Comment 1. BREAST, INVASIVE TUMOR Procedure: Right breast lumpectomy, additional medial margin excision, right axillary sentinel lymph node biopsies. Laterality: Right. Tumor Size: 0.3 cm. Histologic Type: Invasive ductal carcinoma. Grade: 2 Tubular Differentiation: 2 Nuclear Pleomorphism: 2 Mitotic Count: 2 Ductal Carcinoma in Situ (DCIS): Present, high grade with necrosis. Extent of Tumor: Confined to breast parenchyma. Margins: Invasive carcinoma, distance from  closest margin: <0.1 cm posterior margin focally. DCIS, distance from closest margin: <0.1 cm posterior margin focally, and <0.1 cm superior margin focally. Final medial margins negative. Regional Lymph Nodes: Number of Lymph Nodes Examined: 2 Number of Sentinel Lymph Nodes Examined: 2. Microscopic Comment(continued) Lymph Nodes with Macrometastases: 0 Lymph Nodes with Micrometastases: 0 Lymph Nodes with Isolated Tumor Cells: 0 Breast Prognostic Profile: Will be performed on invasive component. Pathologic Stage Classification (pTNM, AJCC 8th Edition): Primary Tumor (pT): pT1a Regional Lymph Nodes (pN): pN0 Distant Metastases (pM): pMX Comments: Immunohistochemistry reveals loss of basal markers (p63, smooth muscle myosin and calponin) in the invasive focus. 1. FLUORESCENCE IN-SITU HYBRIDIZATION Results: HER2 - **POSITIVE** RATIO OF HER2/CEP17 SIGNALS 3.16 AVERAGE HER2 COPY NUMBER PER CELL 15.35 1. PROGNOSTIC INDICATORS Results: IMMUNOHISTOCHEMICAL AND MORPHOMETRIC ANALYSIS PERFORMED MANUALLY Estrogen Receptor: 0%, NEGATIVE Progesterone Receptor: 0%, NEGATIVE Proliferation Marker Ki67: 10%    Diagnosis  05/19/16 Breast, right, needle core biopsy, UIQ - DUCTAL CARCINOMA WITH CALCIFICATIONS. - SEE COMMENT. Microscopic Comment The carcinoma is predominantly in situ and is high grade. There are foci highly suspicious for stromal invasion. Estrogen receptor and progesterone receptors studies will be performed and the results reported separately. The results were called to [facility] on 05/20/16. (JBK:gt, 05/20/16) Results: IMMUNOHISTOCHEMICAL AND MORPHOMETRIC ANALYSIS PERFORMED MANUALLY Estrogen Receptor: 0%, NEGATIVE Progesterone Receptor: 0%, NEGATIVE    RADIOGRAPHIC STUDIES: I have personally reviewed the radiological images as listed and agreed with the findings in the report. No results found.   Diagnostic Mammogram 05/31/17 IMPRESSION: No  mammographic evidence of malignancy. RECOMMENDATION: Annual diagnostic mammography.   ASSESSMENT & PLAN:  67 y.o. Caucasian female, postmenopausal, presents with screening discovered right breast DCIS  1. Breast cancer of upper outer quadrant of right breast, invasive ductal carcinoma, pT1aN0M0, stage IA, ER-/PR-/HER2+, and hight grade DCIS ER-/PR- -I previously discussed her surgical pathology findings, which showed a small (42m) area of invasive ductal carcinoma, ER/PR negative, HER-2 positive, in the background of high-grade DCIS. She had 2 more reexcision for positive margins, and her final surgical margins were still close for DCIS -She completed adjuvant breast radiation 10/13/16-11/30/16, tolerated well -We previously discussed a small risk of breast cancer recurrence. Due to the small size of the invasive ductal carcinoma, she would not need adjuvant chemotherapy. However given the ER/PR negative and HER-2 positive disease, the biology of her cancer is more aggressive, I recommend her close surveillance.  -She is clinically doing well. Lab reviewed, she has mild anemia. Her physical exam and her 05/2017 mammogram were unremarkable. There is no clinical concern for recurrence. -Next Mammogram in 05/2018.  -We discussed her risk of recurrence again and what symptoms to watch. She will call me if she has any concerns.  -continue breast cancer surveillance, F/u in 6 months. Due to her husband's illness, she wishes to to have as less appointments as possible.   2. Anemia with thrombocytosis -Based on Epic records, she has been mildly anemia since early 2018.  -She has no diet restrictions, DM, HTN or significant medical conditions.  -I will check her iron panel, folate, methylmalonic level, reticular count, SPEP with immunofixation, light chain level, for her anemia work-up. I will call her with results. She is agreeable.  -Her thrombocytosis is probably reactive, will continue monitoring.  3.  Bone Health  -Her last DEXA was 03/2014, she is overdue, but she prefers to wait on to early next year and to have it done with her mammogram at some time.  -She is on prescription VitD    [facility]Lab today for anemia, I will call her with results and set up f/u lab appointments as needed  -Lab and f/u in 6 months, or sooner if needed     All questions were answered. The patient knows to call the clinic with any problems, questions or concerns. I spent 20 minutes counseling the patient face to face. The total time spent in the appointment was 25 minutes and more than 50% was on counseling.  IOneal Deputy am acting as scribe for YTruitt Merle MD.   I have reviewed the above documentation for accuracy and completeness, and I agree with the above.      YTruitt Merle MD 11/30/2017

## 2017-11-30 ENCOUNTER — Ambulatory Visit: Payer: Medicare Other | Admitting: Hematology

## 2017-11-30 ENCOUNTER — Inpatient Hospital Stay: Payer: Medicare Other | Attending: Neurology

## 2017-11-30 ENCOUNTER — Inpatient Hospital Stay (HOSPITAL_BASED_OUTPATIENT_CLINIC_OR_DEPARTMENT_OTHER): Payer: Medicare Other | Admitting: Hematology

## 2017-11-30 ENCOUNTER — Inpatient Hospital Stay: Payer: Medicare Other

## 2017-11-30 ENCOUNTER — Other Ambulatory Visit: Payer: Medicare Other

## 2017-11-30 ENCOUNTER — Encounter: Payer: Self-pay | Admitting: Hematology

## 2017-11-30 VITALS — BP 150/67 | HR 75 | Temp 98.8°F | Resp 18 | Ht 62.0 in | Wt 150.4 lb

## 2017-11-30 DIAGNOSIS — D473 Essential (hemorrhagic) thrombocythemia: Secondary | ICD-10-CM | POA: Diagnosis not present

## 2017-11-30 DIAGNOSIS — D0511 Intraductal carcinoma in situ of right breast: Secondary | ICD-10-CM | POA: Insufficient documentation

## 2017-11-30 DIAGNOSIS — Z923 Personal history of irradiation: Secondary | ICD-10-CM | POA: Insufficient documentation

## 2017-11-30 DIAGNOSIS — F418 Other specified anxiety disorders: Secondary | ICD-10-CM | POA: Insufficient documentation

## 2017-11-30 DIAGNOSIS — Z171 Estrogen receptor negative status [ER-]: Secondary | ICD-10-CM | POA: Diagnosis not present

## 2017-11-30 DIAGNOSIS — Z85828 Personal history of other malignant neoplasm of skin: Secondary | ICD-10-CM

## 2017-11-30 DIAGNOSIS — Z79899 Other long term (current) drug therapy: Secondary | ICD-10-CM

## 2017-11-30 DIAGNOSIS — Z7982 Long term (current) use of aspirin: Secondary | ICD-10-CM | POA: Diagnosis not present

## 2017-11-30 DIAGNOSIS — E7800 Pure hypercholesterolemia, unspecified: Secondary | ICD-10-CM | POA: Insufficient documentation

## 2017-11-30 DIAGNOSIS — Z170 Estrogen receptor positive status [ER+]: Secondary | ICD-10-CM

## 2017-11-30 DIAGNOSIS — C50411 Malignant neoplasm of upper-outer quadrant of right female breast: Secondary | ICD-10-CM | POA: Insufficient documentation

## 2017-11-30 DIAGNOSIS — Z90710 Acquired absence of both cervix and uterus: Secondary | ICD-10-CM | POA: Diagnosis not present

## 2017-11-30 DIAGNOSIS — D649 Anemia, unspecified: Secondary | ICD-10-CM | POA: Insufficient documentation

## 2017-11-30 DIAGNOSIS — M170 Bilateral primary osteoarthritis of knee: Secondary | ICD-10-CM | POA: Insufficient documentation

## 2017-11-30 DIAGNOSIS — M85852 Other specified disorders of bone density and structure, left thigh: Secondary | ICD-10-CM

## 2017-11-30 LAB — COMPREHENSIVE METABOLIC PANEL
ALT: 24 U/L (ref 0–44)
ANION GAP: 11 (ref 5–15)
AST: 16 U/L (ref 15–41)
Albumin: 3.4 g/dL — ABNORMAL LOW (ref 3.5–5.0)
Alkaline Phosphatase: 56 U/L (ref 38–126)
BUN: 26 mg/dL — ABNORMAL HIGH (ref 8–23)
CHLORIDE: 103 mmol/L (ref 98–111)
CO2: 26 mmol/L (ref 22–32)
Calcium: 9.9 mg/dL (ref 8.9–10.3)
Creatinine, Ser: 0.98 mg/dL (ref 0.44–1.00)
GFR calc Af Amer: >60 mL/min (ref >60)
GFR, EST NON AFRICAN AMERICAN: 58 mL/min — AB (ref >60)
Glucose, Bld: 100 mg/dL — ABNORMAL HIGH (ref 70–99)
POTASSIUM: 4.3 mmol/L (ref 3.5–5.1)
Sodium: 140 mmol/L (ref 135–145)
TOTAL PROTEIN: 7.5 g/dL (ref 6.5–8.1)

## 2017-11-30 LAB — CBC WITH DIFFERENTIAL/PLATELET
BASOS PCT: 1 %
Basophils Absolute: 0.1 10*3/uL (ref 0.0–0.1)
EOS ABS: 0.2 10*3/uL (ref 0.0–0.5)
EOS PCT: 3 %
HCT: 30.8 % — ABNORMAL LOW (ref 34.8–46.6)
Hemoglobin: 10.4 g/dL — ABNORMAL LOW (ref 11.6–15.9)
LYMPHS ABS: 1 10*3/uL (ref 0.9–3.3)
Lymphocytes Relative: 15 %
MCH: 29.5 pg (ref 25.1–34.0)
MCHC: 33.7 g/dL (ref 31.5–36.0)
MCV: 87.7 fL (ref 79.5–101.0)
MONOS PCT: 10 %
Monocytes Absolute: 0.7 10*3/uL (ref 0.1–0.9)
NEUTROS ABS: 4.7 10*3/uL (ref 1.5–6.5)
Neutrophils Relative %: 71 %
PLATELETS: 515 10*3/uL — AB (ref 145–400)
RBC: 3.52 MIL/uL — ABNORMAL LOW (ref 3.70–5.45)
RDW: 12.4 % (ref 11.2–14.5)
WBC: 6.7 10*3/uL (ref 3.9–10.3)

## 2017-11-30 LAB — RETICULOCYTES
RBC.: 3.56 MIL/uL — ABNORMAL LOW (ref 3.70–5.45)
RETIC COUNT ABSOLUTE: 46.3 10*3/uL (ref 33.7–90.7)
Retic Ct Pct: 1.3 % (ref 0.7–2.1)

## 2017-11-30 LAB — IRON AND TIBC
IRON: 39 ug/dL — AB (ref 41–142)
Saturation Ratios: 9 % — ABNORMAL LOW (ref 21–57)
TIBC: 451 ug/dL — AB (ref 236–444)
UIBC: 412 ug/dL

## 2017-11-30 LAB — FERRITIN: Ferritin: 166 ng/mL (ref 11–307)

## 2017-12-01 LAB — KAPPA/LAMBDA LIGHT CHAINS
Kappa free light chain: 9 mg/L (ref 3.3–19.4)
Kappa, lambda light chain ratio: 0.68 (ref 0.26–1.65)
Lambda free light chains: 13.2 mg/L (ref 5.7–26.3)

## 2017-12-01 LAB — FOLATE RBC
Folate, Hemolysate: 586.7 ng/mL
Folate, RBC: 1936 ng/mL (ref >498)
Hematocrit: 30.3 % — ABNORMAL LOW (ref 34.0–46.6)

## 2017-12-01 LAB — ERYTHROPOIETIN: Erythropoietin: 30.1 m[IU]/mL — ABNORMAL HIGH (ref 2.6–18.5)

## 2017-12-02 ENCOUNTER — Telehealth: Payer: Self-pay

## 2017-12-02 LAB — IMMUNOFIXATION REFLEX, SERUM
IGA: 108 mg/dL (ref 87–352)
IGG (IMMUNOGLOBIN G), SERUM: 482 mg/dL — AB (ref 700–1600)
IGM (IMMUNOGLOBULIN M), SRM: 33 mg/dL (ref 26–217)

## 2017-12-02 LAB — PROTEIN ELECTROPHORESIS, SERUM, WITH REFLEX
A/G Ratio: 1 (ref 0.7–1.7)
ALBUMIN ELP: 3.5 g/dL (ref 2.9–4.4)
Alpha-1-Globulin: 0.5 g/dL — ABNORMAL HIGH (ref 0.0–0.4)
Alpha-2-Globulin: 1.2 g/dL — ABNORMAL HIGH (ref 0.4–1.0)
BETA GLOBULIN: 1.4 g/dL — AB (ref 0.7–1.3)
GLOBULIN, TOTAL: 3.4 g/dL (ref 2.2–3.9)
Gamma Globulin: 0.4 g/dL (ref 0.4–1.8)
SPEP INTERP: 0
Total Protein ELP: 6.9 g/dL (ref 6.0–8.5)

## 2017-12-02 LAB — METHYLMALONIC ACID, SERUM: Methylmalonic Acid, Quantitative: 167 nmol/L (ref 0–378)

## 2017-12-02 NOTE — Telephone Encounter (Signed)
-----   Message from Truitt Merle, MD sent at 12/02/2017 12:07 PM EDT ----- Please let pt know her lab results, she has mild iron deficiency, I recommend oral ferric sulfate 1-2 tabs daily, watch for constipation. Other lab results are OK, SPEP still pending will call her if abnormal. Please set up lab visit in 2-3 months to see if her anemia improves, if she agrees. Thanks  Truitt Merle  12/02/2017

## 2017-12-02 NOTE — Telephone Encounter (Signed)
Left voice message for patient with lab results, per Dr. Burr Medico she has mild iron deficiency, instructed to start taking OTC ferrous sulfate 1 to 2 tabs daily, be mindful this can cause constipation.  We will set up a repeat lab in 2 to 3 months to see if this improves.  Encouraged patient to call back if she has questions.

## 2017-12-06 ENCOUNTER — Telehealth: Payer: Self-pay | Admitting: Hematology

## 2017-12-06 NOTE — Telephone Encounter (Signed)
Scheduled appt per 7/26 sch message - pt aware of appt date and time.

## 2018-01-17 DIAGNOSIS — M170 Bilateral primary osteoarthritis of knee: Secondary | ICD-10-CM | POA: Diagnosis not present

## 2018-01-19 DIAGNOSIS — Z0000 Encounter for general adult medical examination without abnormal findings: Secondary | ICD-10-CM | POA: Diagnosis not present

## 2018-01-19 DIAGNOSIS — E785 Hyperlipidemia, unspecified: Secondary | ICD-10-CM | POA: Diagnosis not present

## 2018-01-19 DIAGNOSIS — E559 Vitamin D deficiency, unspecified: Secondary | ICD-10-CM | POA: Diagnosis not present

## 2018-01-31 ENCOUNTER — Telehealth: Payer: Self-pay

## 2018-01-31 NOTE — Telephone Encounter (Signed)
Patient is schedule to come in on 10/1 for lab work to check iron levels.  Her PCP would like to know if we can check her cholesterol while she is here?  [phone]

## 2018-02-01 ENCOUNTER — Other Ambulatory Visit: Payer: Self-pay | Admitting: Hematology

## 2018-02-01 DIAGNOSIS — E7800 Pure hypercholesterolemia, unspecified: Secondary | ICD-10-CM

## 2018-02-01 NOTE — Telephone Encounter (Signed)
I ordered the lipid panel, please let her know and be fasting for the lab on 10/1, thanks   Truitt Merle MD

## 2018-02-02 NOTE — Telephone Encounter (Signed)
Called patient to let her know we will draw lipid panel on the day she comes in for lab work.  Informed her to be fasting NPO past midnight.  She verbalized an understanding.

## 2018-02-07 ENCOUNTER — Inpatient Hospital Stay: Payer: Medicare Other | Attending: Neurology

## 2018-02-07 DIAGNOSIS — Z923 Personal history of irradiation: Secondary | ICD-10-CM | POA: Diagnosis not present

## 2018-02-07 DIAGNOSIS — D0511 Intraductal carcinoma in situ of right breast: Secondary | ICD-10-CM | POA: Diagnosis not present

## 2018-02-07 DIAGNOSIS — E7800 Pure hypercholesterolemia, unspecified: Secondary | ICD-10-CM | POA: Insufficient documentation

## 2018-02-07 DIAGNOSIS — Z171 Estrogen receptor negative status [ER-]: Secondary | ICD-10-CM | POA: Diagnosis not present

## 2018-02-07 DIAGNOSIS — Z170 Estrogen receptor positive status [ER+]: Secondary | ICD-10-CM | POA: Insufficient documentation

## 2018-02-07 DIAGNOSIS — C50411 Malignant neoplasm of upper-outer quadrant of right female breast: Secondary | ICD-10-CM | POA: Insufficient documentation

## 2018-02-07 LAB — CBC WITH DIFFERENTIAL/PLATELET
Basophils Absolute: 0.1 10*3/uL (ref 0.0–0.1)
Basophils Relative: 2 %
EOS ABS: 0.3 10*3/uL (ref 0.0–0.5)
Eosinophils Relative: 7 %
HCT: 36.8 % (ref 34.8–46.6)
Hemoglobin: 12.4 g/dL (ref 11.6–15.9)
Lymphocytes Relative: 27 %
Lymphs Abs: 1.1 10*3/uL (ref 0.9–3.3)
MCH: 29.4 pg (ref 25.1–34.0)
MCHC: 33.7 g/dL (ref 31.5–36.0)
MCV: 87.2 fL (ref 79.5–101.0)
MONO ABS: 0.4 10*3/uL (ref 0.1–0.9)
MONOS PCT: 9 %
NEUTROS PCT: 55 %
Neutro Abs: 2.3 10*3/uL (ref 1.5–6.5)
Platelets: 293 10*3/uL (ref 145–400)
RBC: 4.23 MIL/uL (ref 3.70–5.45)
RDW: 13.3 % (ref 11.2–14.5)
WBC: 4.1 10*3/uL (ref 3.9–10.3)

## 2018-02-07 LAB — COMPREHENSIVE METABOLIC PANEL
ALK PHOS: 49 U/L (ref 38–126)
ALT: 18 U/L (ref 0–44)
AST: 17 U/L (ref 15–41)
Albumin: 4.3 g/dL (ref 3.5–5.0)
Anion gap: 7 (ref 5–15)
BILIRUBIN TOTAL: 0.3 mg/dL (ref 0.3–1.2)
BUN: 24 mg/dL — ABNORMAL HIGH (ref 8–23)
CALCIUM: 9.6 mg/dL (ref 8.9–10.3)
CO2: 28 mmol/L (ref 22–32)
CREATININE: 0.88 mg/dL (ref 0.44–1.00)
Chloride: 105 mmol/L (ref 98–111)
GFR calc non Af Amer: >60 mL/min (ref >60)
Glucose, Bld: 97 mg/dL (ref 70–99)
Potassium: 4.5 mmol/L (ref 3.5–5.1)
SODIUM: 140 mmol/L (ref 135–145)
TOTAL PROTEIN: 7.2 g/dL (ref 6.5–8.1)

## 2018-02-07 LAB — LIPID PANEL
CHOL/HDL RATIO: 3.7 ratio
Cholesterol: 213 mg/dL — ABNORMAL HIGH (ref 0–200)
HDL: 58 mg/dL (ref >40)
LDL Cholesterol: 131 mg/dL — ABNORMAL HIGH (ref 0–99)
Triglycerides: 121 mg/dL (ref <150)
VLDL: 24 mg/dL (ref 0–40)

## 2018-02-08 ENCOUNTER — Telehealth: Payer: Self-pay

## 2018-02-08 NOTE — Telephone Encounter (Signed)
Faxed patient's lab results to [facility] per her request.

## 2018-02-28 DIAGNOSIS — E559 Vitamin D deficiency, unspecified: Secondary | ICD-10-CM | POA: Diagnosis not present

## 2018-02-28 DIAGNOSIS — Z0000 Encounter for general adult medical examination without abnormal findings: Secondary | ICD-10-CM | POA: Diagnosis not present

## 2018-02-28 DIAGNOSIS — D649 Anemia, unspecified: Secondary | ICD-10-CM | POA: Diagnosis not present

## 2018-02-28 DIAGNOSIS — Z23 Encounter for immunization: Secondary | ICD-10-CM | POA: Diagnosis not present

## 2018-02-28 DIAGNOSIS — E785 Hyperlipidemia, unspecified: Secondary | ICD-10-CM | POA: Diagnosis not present

## 2018-05-25 DIAGNOSIS — D1801 Hemangioma of skin and subcutaneous tissue: Secondary | ICD-10-CM | POA: Diagnosis not present

## 2018-05-25 DIAGNOSIS — L821 Other seborrheic keratosis: Secondary | ICD-10-CM | POA: Diagnosis not present

## 2018-05-25 DIAGNOSIS — Z85828 Personal history of other malignant neoplasm of skin: Secondary | ICD-10-CM | POA: Diagnosis not present

## 2018-05-25 DIAGNOSIS — D692 Other nonthrombocytopenic purpura: Secondary | ICD-10-CM | POA: Diagnosis not present

## 2018-05-25 DIAGNOSIS — D225 Melanocytic nevi of trunk: Secondary | ICD-10-CM | POA: Diagnosis not present

## 2018-05-25 DIAGNOSIS — L570 Actinic keratosis: Secondary | ICD-10-CM | POA: Diagnosis not present

## 2018-05-25 DIAGNOSIS — D485 Neoplasm of uncertain behavior of skin: Secondary | ICD-10-CM | POA: Diagnosis not present

## 2018-05-25 DIAGNOSIS — L814 Other melanin hyperpigmentation: Secondary | ICD-10-CM | POA: Diagnosis not present

## 2018-06-02 ENCOUNTER — Ambulatory Visit
Admission: RE | Admit: 2018-06-02 | Discharge: 2018-06-02 | Disposition: A | Discharge status: Home / Self Care | Payer: Medicare Other | Source: Ambulatory Visit | Attending: Hematology | Admitting: Hematology

## 2018-06-02 DIAGNOSIS — R928 Other abnormal and inconclusive findings on diagnostic imaging of breast: Secondary | ICD-10-CM | POA: Diagnosis not present

## 2018-06-02 DIAGNOSIS — M85852 Other specified disorders of bone density and structure, left thigh: Secondary | ICD-10-CM

## 2018-06-02 DIAGNOSIS — Z853 Personal history of malignant neoplasm of breast: Secondary | ICD-10-CM | POA: Diagnosis not present

## 2018-06-02 DIAGNOSIS — M8589 Other specified disorders of bone density and structure, multiple sites: Secondary | ICD-10-CM | POA: Diagnosis not present

## 2018-06-02 DIAGNOSIS — C50411 Malignant neoplasm of upper-outer quadrant of right female breast: Secondary | ICD-10-CM

## 2018-06-02 DIAGNOSIS — Z780 Asymptomatic menopausal state: Secondary | ICD-10-CM | POA: Diagnosis not present

## 2018-06-02 DIAGNOSIS — Z171 Estrogen receptor negative status [ER-]: Secondary | ICD-10-CM

## 2018-06-02 HISTORY — DX: Malignant neoplasm of unspecified site of unspecified female breast: C50.919

## 2018-06-07 ENCOUNTER — Telehealth: Payer: Self-pay

## 2018-06-07 NOTE — Telephone Encounter (Signed)
Spoke with patient regarding her done density scan results.  She continues to take Calcium and Vitamin D supplements, uses an exercise bike and is unable to walk outside or on a treadmill due to her knees.  Will set up her follow up appointment in about one to two months her request.

## 2018-06-07 NOTE — Telephone Encounter (Signed)
-----   Message from Truitt Merle, MD sent at 06/02/2018  8:38 PM EST ----- Please let pt know the DEXA scan result, will discuss with her on next visit. She is due for 6 month f/u, I do not see f/u appointment scheduled, please schedule lab and f/u in the next few weeks if she agrees. Thanks   Truitt Merle  06/02/2018

## 2018-06-08 ENCOUNTER — Telehealth: Payer: Self-pay | Admitting: Hematology

## 2018-06-08 NOTE — Telephone Encounter (Signed)
Scheduled appt per 12/9 sch message pt is aware of appt date and time   

## 2018-07-13 IMAGING — MG 2D DIGITAL SCREENING BILATERAL MAMMOGRAM WITH CAD AND ADJUNCT TO
8 of 12 series · 8 of 28 positions shown · non-contrast
Comparison: Previous exam(s).

CLINICAL DATA: Screening.

EXAM:
2D DIGITAL SCREENING BILATERAL MAMMOGRAM WITH CAD AND ADJUNCT TOMO

[L MLO]
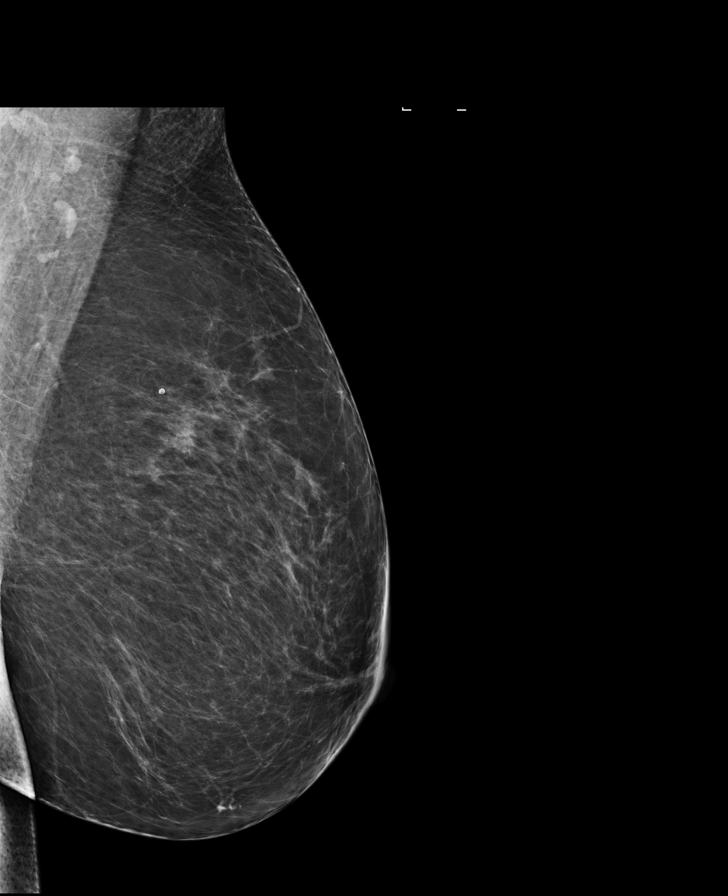

[R MLO synth-2D]
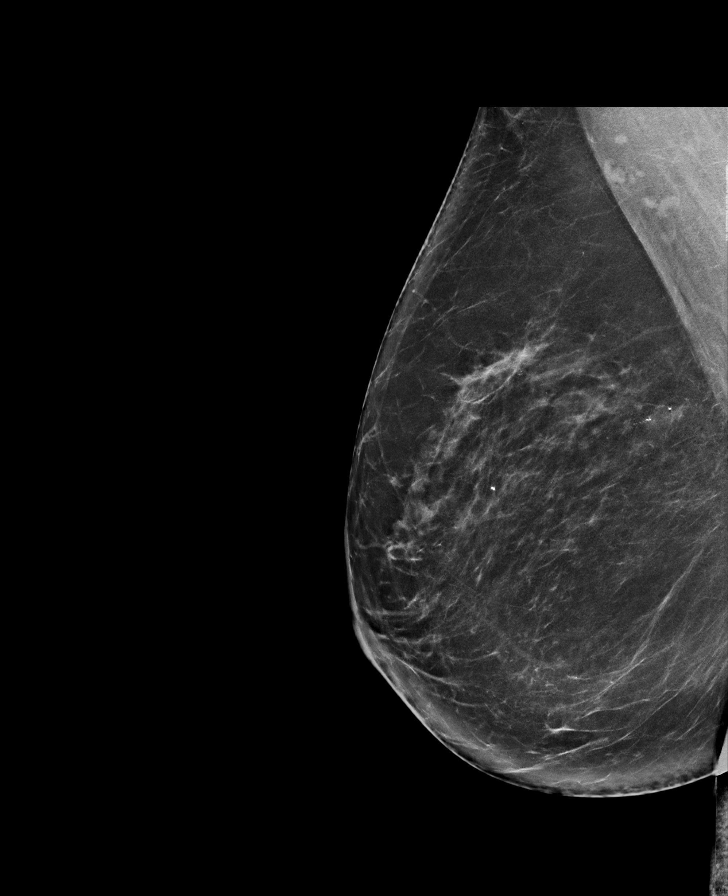

[R CC]
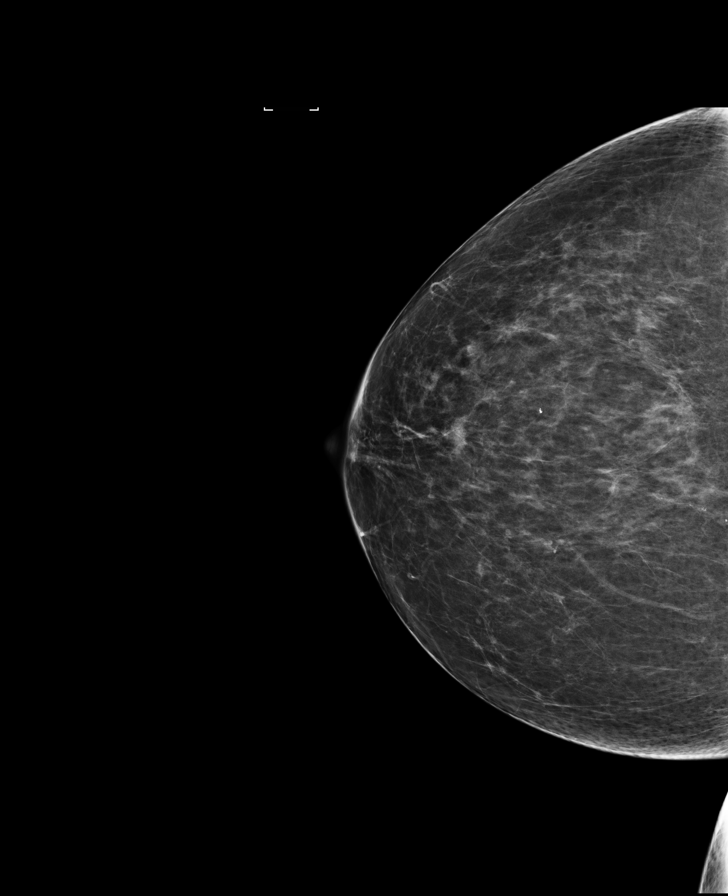

[L CC synth-2D]
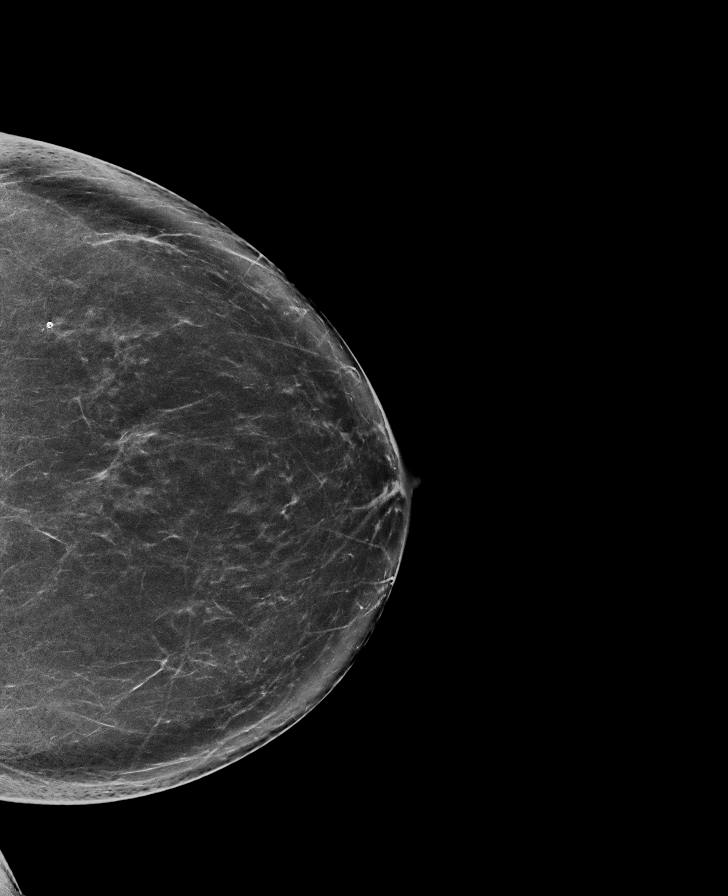

[R CC synth-2D]
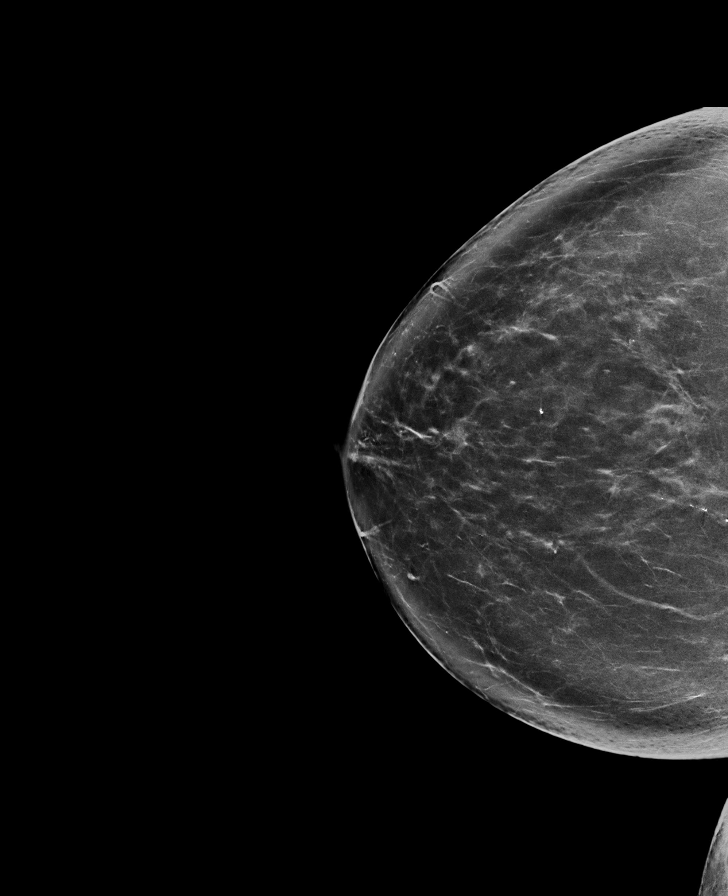

[L CC]
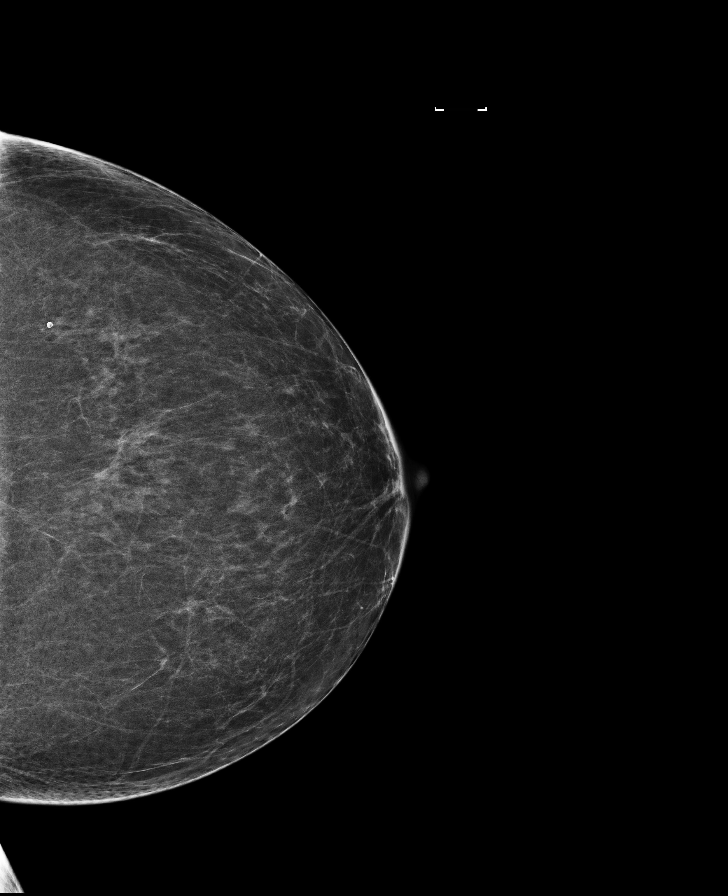

[L MLO synth-2D]
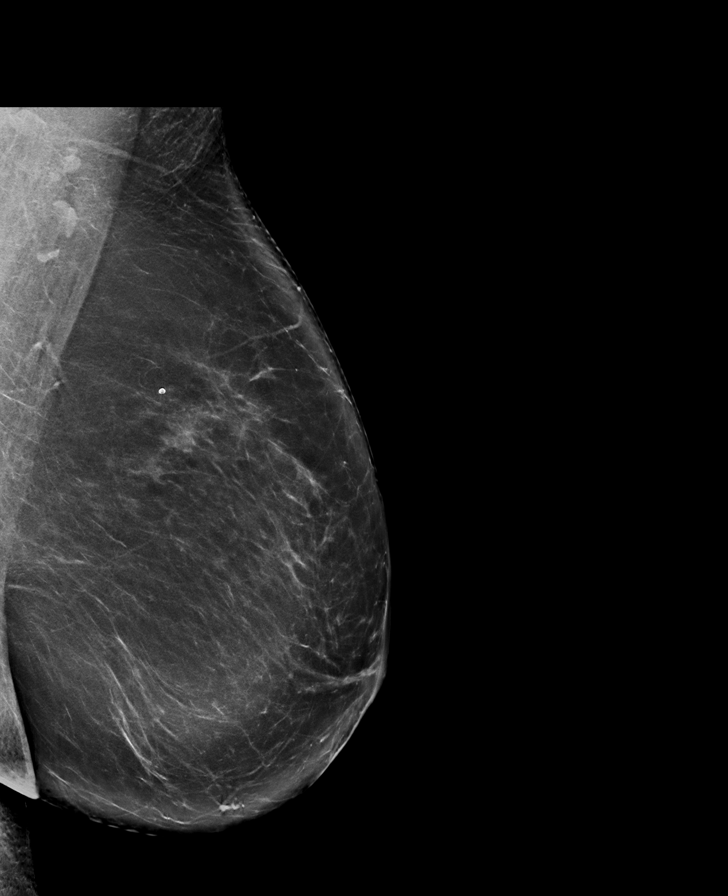

[R MLO]
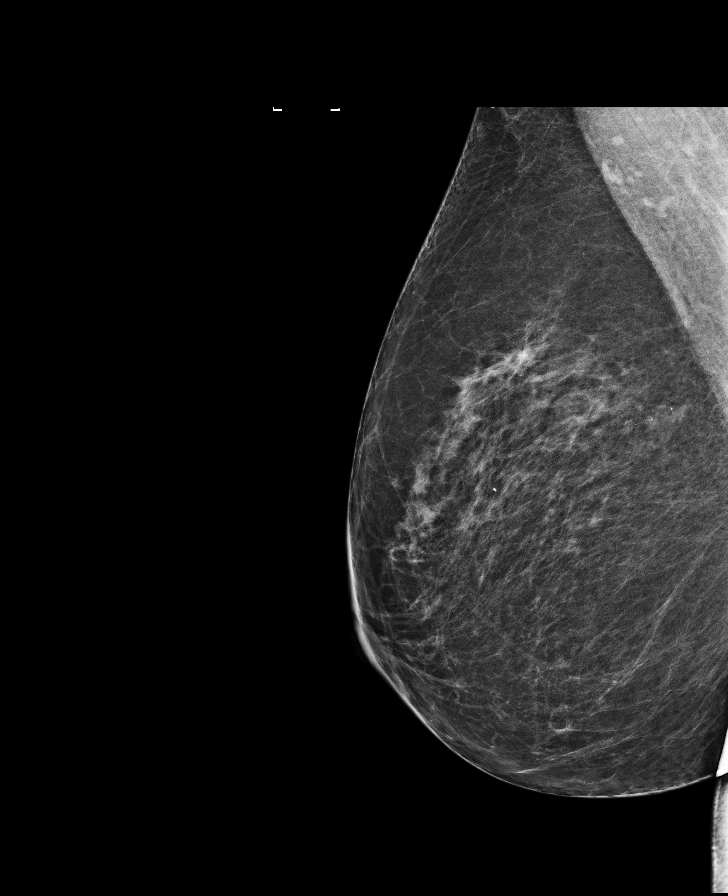

[8 of 28 positions shown; findings below may reference images not displayed]

ACR Breast Density Category b: There are scattered areas of
fibroglandular density.
FINDINGS: In the right breast, calcifications warrant further evaluation with
magnified views. In the left breast, no findings suspicious for
malignancy. Images were processed with CAD.
IMPRESSION: Further evaluation is suggested for calcifications in the right
breast.

RECOMMENDATION:
Diagnostic mammogram of the right breast. (Code:[id number])

The patient will be contacted regarding the findings, and additional
imaging will be scheduled.

BI-RADS CATEGORY  0: Incomplete. Need additional imaging evaluation
and/or prior mammograms for comparison.

## 2018-08-02 NOTE — Progress Notes (Signed)
Dawn West   Telephone:([phone] Fax:([phone]) [phone]   Clinic Follow up Note   Patient Care Team: Kieth Brightly as PCP - General (Physician Assistant) Kem Boroughs, [place] as Nurse Practitioner (Family Medicine) [name], MD as Consulting Physician (General Surgery) Truitt Merle, MD as Consulting Physician (Hematology) Gery Pray, MD as Consulting Physician (Radiation Oncology)   I connected with Dawn West on 08/04/2018 at 10:45 AM EDT by telephone and verified that I am speaking with the correct person using two identifiers.   I discussed the limitations, risks, security and privacy concerns of performing an evaluation and management service by telephone and the availability of in person appointments. I also discussed with the patient that there may be a patient responsible charge related to this service. The patient expressed understanding and agreed to proceed.    CHIEF COMPLAINT: F/u of right breast cancer and anemia   SUMMARY OF ONCOLOGIC HISTORY: Oncology History   Cancer Staging Breast cancer of upper-outer quadrant of right female breast ([place]) Staging form: Breast, AJCC 8th Edition - Clinical: Stage 0 (cTis (DCIS), cN0, cM0, ER: Negative, PR: Negative) - Signed by Truitt Merle, MD on 05/25/2016 - Pathologic stage from 06/02/2016: Stage IA (pT1a, pN0, cM0, G2, ER: Negative, PR: Negative, HER2: Positive) - Signed by Truitt Merle, MD on 11/29/2016       Breast cancer of upper-outer quadrant of right female breast ([place])   05/18/2016 Mammogram    Diagnostic mammogram of the right breast showed heterogeneous calcifications in a linear orientation within the posterior upper right breast and spanning a distance of 2.2 cm. No associated mass identified.    05/19/2016 Initial Biopsy    Right breast core needle biopsy showed ductal carcinoma with calcification, high-grade, focal highly suspicious for stromal invasion.    05/19/2016 Receptors her2    ER negative, PR negative    05/19/2016 Initial Diagnosis    Ductal carcinoma in situ (DCIS) of right breast    06/02/2016 Surgery    RIGHT BREAST LUMPECTOMY WITH RADIOACTIVE SEED AND SENTINEL LYMPH NODE BIOPSY by Dr. Barry Dienes    06/02/2016 Pathology Results    Diagnosis 06/02/16 1. Breast, lumpectomy, Right w/seed - INVASIVE DUCTAL CARCINOMA, GRADE 2, SPANNING 0.3 CM. - HIGH GRADE DUCTAL CARCINOMA IN SITU WITH NECROSIS. - INVASIVE CARCINOMA COMES TO WITHIN <0.1 CM OF THE POSTERIOR MARGIN FOCALLY. - DUCTAL CARCINOMA IN SITU COMES TO WITHIN <0.1 CM OF THE POSTERIOR MARGIN FOCALLY AND THE SUPERIOR MARGIN FOCALLY. - BIOPSY SITE. - SEE ONCOLOGY TABLE. 2. Breast, excision, Right additional Medial Margin - FIBROCYSTIC CHANGE. - USUAL DUCTAL HYPERPLASIA. - NO MALIGNANCY IDENTIFIED. 3. Lymph node, sentinel, biopsy, Right Axillary #1 - ONE OF ONE LYMPH NODES NEGATIVE FOR CARCINOMA (0/1). 4. Lymph node, sentinel, biopsy, Right Axillary - ONE OF ONE LYMPH NODES NEGATIVE FOR CARCINOMA (0/1).    06/02/2016 Receptors her2    ER-, PR-, HER2+     07/26/2016 Surgery    RE-EXCISION OF RIGHT BREAST LUMPECTOMY by Dr. Barry Dienes    07/26/2016 Pathology Results    Diagnosis 07/26/16 Breast, excision, Right new Superior Margin - DUCTAL CARCINOMA IN SITU WITH CALCIFICATIONS, HIGH GRADE, SPANNING AT LEAST 1.5 CM. - FOCI HIGHLY SUSPICIOUS FOR EARLY STROMAL INVASION. - DUCTAL CARCINOMA IN SITU IS BROADLY LESS THAN 0.1 CM TO THE NEW SUPERIOR MARGIN. - SEE COMMENT.    08/26/2016 Pathology Results    Diagnosis 08/26/16 Breast, right, needle core biopsy, superior anterior - HIGH GRADE, DUCTAL CARCINOMA IN SITU -  SEE COMMENT    09/06/2016 Surgery    RE-EXCISION OF RIGHT BREAST LUMPECTOMY WITH RADIOACTIVE SEED LOCALIZATION by Dr. Barry Dienes    09/06/2016 Pathology Results    Diagnosis 09/06/16 1. Breast, excision, Right Superior Margin - INVASIVE DUCTAL CARCINOMA, GRADE III/III, SCATTERED MICROSCOPIC FOCI. - DUCTAL  CARCINOMA IN SITU WITH CALCIFICATIONS, HIGH GRADE. - INVASIVE CARCINOMA IS FOCALLY LESS THAN 0.1 CM TO THE INFERIOR MARGIN OF SPECIMEN #1.. - DUCTAL CARCINOMA IN SITU IS BROADLY LESS THAN 0.1 CM TO THE LATERAL MARGIN, FOCALLY LESS THAN 0.1 CM TO THE INFERIOR MARGIN, AND FOCALLY LESS THAN 0.1 CM TO THE ANTERIOR MARGIN OF SPECIMEN #1. - SEE COMMENT. 2. Breast, excision, Right Superolateral Margin - FIBROCYSTIC CHANGES. - THERE IS NO EVIDENCE OF MALIGNANCY. - SEE COMMENT.    10/13/2016 - 11/30/2016 Radiation Therapy    Radiation by Dr. Sondra Come  Radiation treatment dates:   10/13/16 - 11/30/16 Site/dose:    1) Right Breast treated to 50.4 Gy in 28 fractions 2) Right Breast Boosted an additional 12 Gy in 6 fractions             Beams/energy:    1) 3D  //  6X 2) 3D  //  6X Narrative: The patient tolerated radiation treatment relatively well. The patient experienced occasional mild pain in her right breast. She also reported some fatigue and appetite reduction throughout treatment, though this did not affect her activities of daily living. She experienced some radiation related skin changes including moderate erythema across the right breast.     05/31/2017 Mammogram    Diagnostic Mammogram 05/31/17 IMPRESSION: No mammographic evidence of malignancy. RECOMMENDATION: Annual diagnostic mammography.      CURRENT THERAPY:  Surveillance   INTERVAL HISTORY:  Dawn West is here for a follow up of right breast cancer. She was last seen by me 8 months ago. She was able to identify herself by birthdate. She notes she is doing well. She notes her husband has leukemia and is taking care of him now.  I reviewed her medication list with her. She notes she is still taking oral iron. She denies any signs of bleeding. She notes she had a colonoscopy in 2019 and was told it was good and adequate. She repeats every 5 years.    REVIEW OF SYSTEMS:   Constitutional: Denies fevers, chills or abnormal  weight loss Eyes: Denies blurriness of vision Ears, nose, mouth, throat, and face: Denies mucositis or sore throat Respiratory: Denies cough, dyspnea or wheezes Cardiovascular: Denies palpitation, chest discomfort or lower extremity swelling Gastrointestinal:  Denies nausea, heartburn or change in bowel habits Skin: Denies abnormal skin rashes Lymphatics: Denies new lymphadenopathy or easy bruising Neurological:Denies numbness, tingling or new weaknesses Behavioral/Psych: Mood is stable, no new changes  All other systems were reviewed with the patient and are negative.  MEDICAL HISTORY:  Past Medical History:  Diagnosis Date  . Anxiety   . Arthritis    knees  . Breast cancer ([place]) 2018   Right Breast  . Cancer ([place]) 12/01/11   basal cell skin ca Left upper arm  . Cancer ([place]) 04/20/2016   right breast cancer  . Complication of anesthesia   . Depression   . Headache   . History of radiation therapy 10/13/16-11/30/16   right breast 50.4 Gy in 28 fractions, right breast boost 12 Gy in 6 fractions  . Hypercholesteremia   . Personal history of radiation therapy 2018   Right Breast Cancer  . PONV (postoperative nausea  and vomiting)     SURGICAL HISTORY: Past Surgical History:  Procedure Laterality Date  . ABDOMINAL HYSTERECTOMY    . BREAST LUMPECTOMY Right 2018  . BREAST LUMPECTOMY WITH RADIOACTIVE SEED AND SENTINEL LYMPH NODE BIOPSY Right 06/02/2016   Procedure: RIGHT BREAST LUMPECTOMY WITH RADIOACTIVE SEED AND SENTINEL LYMPH NODE BIOPSY;  Surgeon: [name], MD;  Location: [facility];  Service: General;  Laterality: Right;  . BREAST LUMPECTOMY WITH RADIOACTIVE SEED LOCALIZATION Right 09/06/2016   Procedure: RE-EXCISION OF RIGHT BREAST LUMPECTOMY WITH RADIOACTIVE SEED LOCALIZATION;  Surgeon: [name], MD;  Location: [facility];  Service: General;  Laterality: Right;  . BREAST SURGERY  05/19/2016   right breast biopsy  . KNEE ARTHROPLASTY Left  06/10/2016   Procedure: LEFT TOTAL KNEE ARTHROPLASTY WITH COMPUTER NAVIGATION;  Surgeon: Rod Can, MD;  Location: [facility];  Service: Orthopedics;  Laterality: Left;  . KNEE ARTHROPLASTY Right 01/20/2017   Procedure: RIGHT TOTAL KNEE ARTHROPLASTY WITH COMPUTER NAVIGATION;  Surgeon: Rod Can, MD;  Location: [facility];  Service: Orthopedics;  Laterality: Right;  Adductor Block  . MOUTH SURGERY    . RE-EXCISION OF BREAST LUMPECTOMY Right 07/26/2016   Procedure: RE-EXCISION OF RIGHT BREAST LUMPECTOMY;  Surgeon: [name], MD;  Location: [facility];  Service: General;  Laterality: Right;  . SKIN CANCER EXCISION Left 2013   upper arm, basal cell  . TONSILLECTOMY AND ADENOIDECTOMY     (DEVIATED SEPTUM  SAME TIME)  . TOTAL VAGINAL HYSTERECTOMY  12/1996   menorrhagia, fibroids    I have reviewed the social history and family history with the patient and they are unchanged from previous note.  ALLERGIES:  is allergic to allegra [fexofenadine hcl]; ciprofloxacin; estradiol; oxycodone; penicillins; and sulfa antibiotics.  MEDICATIONS:  Current Outpatient Medications  Medication Sig Dispense Refill  . aspirin 81 MG chewable tablet Chew 1 tablet (81 mg total) by mouth 2 (two) times daily with a meal. (Patient not taking: Reported on 11/30/2017) 60 tablet 1  . Calcium Carb-Cholecalciferol (CALCIUM 600 + D) 600-200 MG-UNIT TABS Take 1 tablet by mouth 2 (two) times daily.     . cetirizine (ZYRTEC) 10 MG tablet Take 10 mg by mouth at bedtime. At 10 pm daily    . diclofenac (VOLTAREN) 75 MG EC tablet Take 75 mg by mouth 2 (two) times daily.    . diphenhydrAMINE (EQ NIGHTTIME SLEEP AID) 25 MG tablet Take 25 mg by mouth at bedtime as needed for sleep.    Marland Kitchen docusate sodium (COLACE) 100 MG capsule Take 1 capsule (100 mg total) by mouth 2 (two) times daily. 60 capsule 1  . fluticasone (FLONASE) 50 MCG/ACT nasal spray Place 1 spray into both nostrils daily.     . Glucos-Chond-Hyal Ac-Ca Fructo (MOVE FREE JOINT  HEALTH ADVANCE PO) Take 1 tablet by mouth daily.    . hyaluronate sodium (RADIAPLEXRX) GEL Apply 1 application topically 2 (two) times daily.    Marland Kitchen HYDROcodone-acetaminophen (NORCO/VICODIN) 5-325 MG tablet Take 1-2 tablets by mouth every 4 (four) hours as needed (breakthrough pain). (Patient not taking: Reported on 11/30/2017) 60 tablet 0  . Magnesium 250 MG TABS Take 250 mg by mouth 3 (three) times daily.     . Multiple Vitamin (MULTIVITAMIN) tablet Take 1 tablet by mouth daily.    . Omega-3 Fatty Acids (FISH OIL) 1000 MG CAPS Take 1,000 mg by mouth 3 (three) times daily. Takes 1-3 times daily    . ondansetron (ZOFRAN) 4 MG tablet Take 1  tablet (4 mg total) by mouth every 6 (six) hours as needed for nausea. (Patient not taking: Reported on 11/30/2017) 20 tablet 0  . rosuvastatin (CRESTOR) 5 MG tablet TAKE 1 TABLET DAILY HIGH CHOLESTEROL  0  . senna (SENOKOT) 8.6 MG TABS tablet Take 2 tablets (17.2 mg total) by mouth at bedtime. 120 each 0  . sertraline (ZOLOFT) 100 MG tablet Take 1 tablet (100 mg total) by mouth daily. (Patient taking differently: Take 100 mg by mouth daily. ) 90 tablet 4  . sodium chloride (OCEAN) 0.65 % SOLN nasal spray Place 1 spray into both nostrils 2 (two) times daily as needed for congestion.    Marland Kitchen tetrahydrozoline 0.05 % ophthalmic solution Place 1 drop into both eyes daily as needed (dry eyes).    . Vitamin D, Ergocalciferol, (DRISDOL) 50000 units CAPS capsule Take 1 capsule (50,000 Units total) by mouth See admin instructions. Takes every other week on Friday 30 capsule 2  . vitamin E 400 UNIT capsule Take 400 Units by mouth 3 (three) times daily.     Marland Kitchen zinc gluconate 50 MG tablet Take 50 mg by mouth daily.     No current facility-administered medications for this visit.     PHYSICAL EXAMINATION: ECOG PERFORMANCE STATUS: 0 - Asymptomatic  -Vitals not taken today   exam deferred today   LABORATORY DATA:  I have reviewed the data as listed CBC Latest Ref Rng & Units  02/07/2018 11/30/2017 11/30/2017  WBC 3.9 - 10.3 K/uL 4.1 6.7 -  Hemoglobin 11.6 - 15.9 g/dL 12.4 10.4(L) -  Hematocrit 34.8 - 46.6 % 36.8 30.3(L) 30.8(L)  Platelets 145 - 400 K/uL 293 515(H) -     CMP Latest Ref Rng & Units 02/07/2018 11/30/2017 01/21/2017  Glucose 70 - 99 mg/dL 97 100(H) 122(H)  BUN 8 - 23 mg/dL 24(H) 26(H) 13  Creatinine 0.44 - 1.00 mg/dL 0.88 0.98 0.61  Sodium 135 - 145 mmol/L 140 140 139  Potassium 3.5 - 5.1 mmol/L 4.5 4.3 4.1  Chloride 98 - 111 mmol/L 105 103 105  CO2 22 - 32 mmol/L '28 26 26  '$ Calcium 8.9 - 10.3 mg/dL 9.6 9.9 9.0  Total Protein 6.5 - 8.1 g/dL 7.2 7.5 -  Total Bilirubin 0.3 - 1.2 mg/dL 0.3 <0.2(L) -  Alkaline Phos 38 - 126 U/L 49 56 -  AST 15 - 41 U/L 17 16 -  ALT 0 - 44 U/L 18 24 -      RADIOGRAPHIC STUDIES: I have personally reviewed the radiological images as listed and agreed with the findings in the report. No results found.   DEXA 06/02/18  ASSESSMENT: The BMD measured at Femur Neck Left is 0.810 g/cm2 with a T-score of -1.6. This patient is considered osteopenic according to [facility]) criteria.  There has been a statistically significant decrease in BMD of Left hip since prior exam dated 03/22/2014. The scan quality is good. L-4 was excluded due to degenerative changes. Spine was not compared to prior study due to exclusion of vertebral bodies on current exam. DXA exam performed on prior Hologic device measured only unilateral hip (Total Mean was not measured), therefore current or prior Total Mean cannot be compared.  Site Region Measured Date Measured Age YA BMD Significant CHANGE T-score AP Spine  L1-L3      06/02/2018    67.7         -1.1    1.036 g/cm2  DualFemur Neck Left 06/02/2018 67.7 -1.6 0.810 g/cm2 *  DualFemur Neck Left  03/22/2014    63.5         -1.2    0.878 g/cm2  DualFemur Total Mean 06/02/2018    67.7         -0.1    1.001 g/cm2  World Health Organization [facility]) criteria for  post-menopausal, Caucasian Women: Normal       T-score at or above -1 SD Osteopenia   T-score between -1 and -2.5 SD Osteoporosis T-score at or below -2.5 SD  ASSESSMENT & PLAN:  Zamaria Brazzle is a 68 y.o. female with   1. Breast cancer of upper outer quadrant of right breast, invasive ductal carcinoma, pT1aN0M0, stage IA, ER-/PR-/HER2+, and hight grade DCIS ER-/PR- -She was diagnosed in 05/2017. She is s/p right breast lumpectomy and 2 breast re-excision surgeries and s/p adjuvant RT.  -We previously discussed a small risk of breast cancer recurrence. Due to the small size of the invasive ductal carcinoma, she would not need adjuvant chemotherapy. However given the ER/PR negative and HER-2 positive disease, the biology of her cancer is more aggressive, I recommend her close surveillance.  -She is clinically doing well. Last labs showed anemia and thrombocytosis resolved. a. Her 05/2018 mammogram was unremarkable. There is no clinical concern for recurrence. -Next Mammogram in 05/2019  -Continue breast cancer surveillance, F/u in 10 months   2. Iron deficient Anemia with thrombocytosis -Based on Epic records, she has been mildly anemia since early 2018.  -She has no diet restrictions, DM, HTN or significant medical conditions. -Per pt she had a colonoscopy in 2019 which was unremarkable -Anemia workup showed iron deficiency and otherwise negative.  -She started OTC oral ferrous sulfate 1-2 times daily in summer 2019.  -Anemia and thrombocytosis resolved on last labs in 02/2018.  -Will continue to monitor with PCP in interim. If stable, continue with only oral iron.    3. Osteopenia  -Her 05/2018 DEXA shows osteopenia with lowest T-score of -1.6 at left hip. I reviewed in great detail with patient.  -Continue Calcium and VitD  -I encouraged her to remain active with weight bearing exercise. Given her 2 knee resplendent her orthopedist did not recommend walking.  -Will monitor every 2  years. If significantly worsens may consider bisphosphonate intervention.     Plan -Will send message to [facility] for her f/u on 4-5 months  -She is clinically doing well, continue surveillance -lab and f/u in 10 months    No problem-specific Assessment & Plan notes found for this encounter.   No orders of the defined types were placed in this encounter.  All questions were answered. The patient knows to call the clinic with any problems, questions or concerns. No barriers to learning was detected. I spent 15 minutes counseling the patient face to face. The total time spent in the appointment was 20 minutes and more than 50% was on counseling and review of test results     Truitt Merle, MD 08/04/2018   I, Joslyn Devon, am acting as scribe for Truitt Merle, MD.   I have reviewed the above documentation for accuracy and completeness, and I agree with the above.

## 2018-08-04 ENCOUNTER — Inpatient Hospital Stay: Payer: Medicare Other | Attending: Hematology | Admitting: Hematology

## 2018-08-04 ENCOUNTER — Inpatient Hospital Stay: Payer: Medicare Other

## 2018-08-04 DIAGNOSIS — C50411 Malignant neoplasm of upper-outer quadrant of right female breast: Secondary | ICD-10-CM

## 2018-08-04 DIAGNOSIS — Z171 Estrogen receptor negative status [ER-]: Secondary | ICD-10-CM

## 2018-08-07 ENCOUNTER — Telehealth: Payer: Self-pay | Admitting: Hematology

## 2018-08-07 NOTE — Telephone Encounter (Signed)
No los per 3/27.

## 2018-09-08 DIAGNOSIS — E559 Vitamin D deficiency, unspecified: Secondary | ICD-10-CM | POA: Diagnosis not present

## 2018-09-08 DIAGNOSIS — M19049 Primary osteoarthritis, unspecified hand: Secondary | ICD-10-CM | POA: Diagnosis not present

## 2018-09-08 DIAGNOSIS — E785 Hyperlipidemia, unspecified: Secondary | ICD-10-CM | POA: Diagnosis not present

## 2018-09-08 DIAGNOSIS — F419 Anxiety disorder, unspecified: Secondary | ICD-10-CM | POA: Diagnosis not present

## 2018-11-30 ENCOUNTER — Other Ambulatory Visit: Payer: Self-pay | Admitting: Internal Medicine

## 2018-12-05 DIAGNOSIS — C50211 Malignant neoplasm of upper-inner quadrant of right female breast: Secondary | ICD-10-CM | POA: Diagnosis not present

## 2018-12-05 DIAGNOSIS — Z634 Disappearance and death of family member: Secondary | ICD-10-CM | POA: Diagnosis not present

## 2018-12-05 DIAGNOSIS — Z171 Estrogen receptor negative status [ER-]: Secondary | ICD-10-CM | POA: Diagnosis not present

## 2019-01-11 DIAGNOSIS — H2513 Age-related nuclear cataract, bilateral: Secondary | ICD-10-CM | POA: Diagnosis not present

## 2019-01-11 DIAGNOSIS — H43813 Vitreous degeneration, bilateral: Secondary | ICD-10-CM | POA: Diagnosis not present

## 2019-01-19 DIAGNOSIS — Z23 Encounter for immunization: Secondary | ICD-10-CM | POA: Diagnosis not present

## 2019-01-29 DIAGNOSIS — F5102 Adjustment insomnia: Secondary | ICD-10-CM | POA: Diagnosis not present

## 2019-02-02 DIAGNOSIS — Z96653 Presence of artificial knee joint, bilateral: Secondary | ICD-10-CM | POA: Diagnosis not present

## 2019-02-02 DIAGNOSIS — Z471 Aftercare following joint replacement surgery: Secondary | ICD-10-CM | POA: Diagnosis not present

## 2019-04-18 DIAGNOSIS — H93A1 Pulsatile tinnitus, right ear: Secondary | ICD-10-CM | POA: Diagnosis not present

## 2019-04-18 DIAGNOSIS — H903 Sensorineural hearing loss, bilateral: Secondary | ICD-10-CM | POA: Diagnosis not present

## 2019-04-18 DIAGNOSIS — H9313 Tinnitus, bilateral: Secondary | ICD-10-CM | POA: Diagnosis not present

## 2019-04-18 DIAGNOSIS — Z77122 Contact with and (suspected) exposure to noise: Secondary | ICD-10-CM | POA: Diagnosis not present

## 2019-05-08 ENCOUNTER — Other Ambulatory Visit: Payer: Self-pay | Admitting: Hematology

## 2019-05-08 DIAGNOSIS — Z853 Personal history of malignant neoplasm of breast: Secondary | ICD-10-CM

## 2019-05-15 ENCOUNTER — Other Ambulatory Visit: Payer: Self-pay | Admitting: Gastroenterology

## 2019-05-15 ENCOUNTER — Other Ambulatory Visit (HOSPITAL_COMMUNITY): Payer: Self-pay | Admitting: Gastroenterology

## 2019-05-15 DIAGNOSIS — R1011 Right upper quadrant pain: Secondary | ICD-10-CM

## 2019-05-22 ENCOUNTER — Encounter (HOSPITAL_COMMUNITY)
Admission: RE | Admit: 2019-05-22 | Discharge: 2019-05-22 | Disposition: A | Discharge status: Home / Self Care | Payer: Medicare Other | Source: Ambulatory Visit | Attending: Gastroenterology | Admitting: Gastroenterology

## 2019-05-22 ENCOUNTER — Other Ambulatory Visit: Payer: Self-pay

## 2019-05-22 DIAGNOSIS — R1011 Right upper quadrant pain: Secondary | ICD-10-CM | POA: Insufficient documentation

## 2019-05-28 ENCOUNTER — Telehealth: Payer: Self-pay | Admitting: Hematology

## 2019-05-28 NOTE — Telephone Encounter (Signed)
Scheduled per 1/15 sch msg. Called pt no answer and unable to leave a msg. Mailing printout

## 2019-05-30 ENCOUNTER — Other Ambulatory Visit: Payer: Self-pay | Admitting: Surgery

## 2019-06-06 ENCOUNTER — Ambulatory Visit
Admission: RE | Admit: 2019-06-06 | Discharge: 2019-06-06 | Disposition: A | Discharge status: Home / Self Care | Payer: Medicare Other | Source: Ambulatory Visit | Attending: Hematology | Admitting: Hematology

## 2019-06-06 ENCOUNTER — Encounter (HOSPITAL_BASED_OUTPATIENT_CLINIC_OR_DEPARTMENT_OTHER): Payer: Self-pay | Admitting: Surgery

## 2019-06-06 ENCOUNTER — Other Ambulatory Visit: Payer: Self-pay

## 2019-06-06 DIAGNOSIS — Z853 Personal history of malignant neoplasm of breast: Secondary | ICD-10-CM

## 2019-06-08 NOTE — Progress Notes (Signed)

## 2019-06-09 ENCOUNTER — Other Ambulatory Visit (HOSPITAL_COMMUNITY)
Admission: RE | Admit: 2019-06-09 | Discharge: 2019-06-09 | Disposition: A | Discharge status: Home / Self Care | Payer: Medicare Other | Source: Ambulatory Visit | Attending: Surgery | Admitting: Surgery

## 2019-06-09 DIAGNOSIS — Z20822 Contact with and (suspected) exposure to covid-19: Secondary | ICD-10-CM | POA: Insufficient documentation

## 2019-06-09 DIAGNOSIS — Z01812 Encounter for preprocedural laboratory examination: Secondary | ICD-10-CM | POA: Diagnosis present

## 2019-06-09 LAB — SARS CORONAVIRUS 2 (TAT 6-24 HRS): SARS Coronavirus 2: NEGATIVE

## 2019-06-12 NOTE — H&P (Signed)
  Dawn West Documented: 05/30/2019 2:49 PM Location: [facility] Patient #: [id number] DOB: 1950-08-30 Married / Language: Dawn West / Race: White Female   History of Present Illness ([name] [name] MD; 05/30/2019 3:12 PM) The patient is a 69 year old female who presents for evaluation of gall stones. This is a breast cancer patient of Dr. Marlowe Aschoff who was referred here by Dr. Collene Mares for symptomatic gallstones and possible cholecystitis. Since Thanksgiving, she's been having right upper quadrant abdominal pain with bloating and nausea. This is occurring daily with just about anything she eats. She has had no emesis. The pain is moderate in intensity. She underwent an ultrasound showing gallstones with some gallbladder wall thickening. The bile duct was only 2 mm   Allergies Sabino Gasser, CMA; 05/30/2019 2:49 PM) Delma Freeze *ANTIHISTAMINES*  Ciprofloxacin *CHEMICALS*  Estradiol *CHEMICALS*  Penicillins  Rash. Sulfa Antibiotics  Rash. Allergies Reconciled   Medication History Sabino Gasser, CMA; 05/30/2019 2:50 PM) Tylenol (Oral 2tab in am and 2tab in pm) Specific strength unknown - Active. Pravastatin Sodium (40MG  Tablet, Oral daily) Active. Eliquis (2.5MG  Tablet, Oral) Active. Diclofenac (75MG  Tablet, Oral) Active. Aspirin (81MG  Tablet DR, Oral) Active. Calcium 600 (1500 (600 Ca)MG Tablet, Oral) Active. Estradiol (1MG  Tablet, Oral) Active. Flonase (50MCG/ACT Suspension, Nasal) Active. Magnesium (250MG  Tablet, Oral) Active. Sertraline HCl (100MG  Tablet, Oral) Active. Vitamin D (Ergocalciferol) (50000UNIT Capsule, Oral) Active. Vitamin E (400UNIT Tablet, Oral) Active. ZyrTEC Allergy (10MG  Capsule, Oral) Active. Multiple Vitamin (1 (one) Oral) Active. Omega 3 (1000MG  Capsule, Oral) Active. Medications Reconciled  Vitals Sabino Gasser CMA; 05/30/2019 2:51 PM) 05/30/2019 2:50 PM Weight: 143.8 lb Height: 63in Body Surface Area: 1.68 m  Body Mass Index: 25.47 kg/m  Temp.: 97.51F(Tympanic)  Pulse: 80 (Regular)  BP: 112/72 (Sitting, Left Arm, Standard)       Physical Exam (Maizee Reinhold [name] MD; 05/30/2019 3:12 PM) The physical exam findings are as follows: Note:On exam, she appears comfortable. Her abdomen does show bloating with some tenderness in the right upper quadrant and mild guarding.  I reviewed her ultrasound and all her laboratory data    Assessment & Plan (Inari Shin [name] MD; 05/30/2019 3:13 PM) SYMPTOMATIC CHOLELITHIASIS (K80.20) Impression: This is a patient with symptomatic gallstones and I suspect some cholecystitis. Laparoscopic cholecystectomy is strongly recommended and she is eager to proceed urgently. I discussed the procedure in detail. The patient was given Neurosurgeon. We discussed the risks and benefits of a laparoscopic cholecystectomy and possible cholangiogram including, but not limited to, bleeding, infection, injury to surrounding structures such as the intestine or liver, bile leak, retained gallstones, need to convert to an open procedure, prolonged diarrhea, blood clots such as DVT, common bile duct injury, anesthesia risks, and possible need for additional procedures. The likelihood of improvement in symptoms and return to the patient's normal status is good. We discussed the typical post-operative recovery course. All questions were answered.  This patient encounter took 30 minutes today to perform the following: take history, perform exam, review outside records, interpret imaging, counsel the patient on their diagnosis and document encounter, findings & plan in the EHR

## 2019-06-13 ENCOUNTER — Ambulatory Visit (HOSPITAL_BASED_OUTPATIENT_CLINIC_OR_DEPARTMENT_OTHER): Payer: Medicare Other | Admitting: Anesthesiology

## 2019-06-13 ENCOUNTER — Other Ambulatory Visit: Payer: Self-pay

## 2019-06-13 ENCOUNTER — Encounter (HOSPITAL_BASED_OUTPATIENT_CLINIC_OR_DEPARTMENT_OTHER): Payer: Self-pay | Admitting: Surgery

## 2019-06-13 ENCOUNTER — Encounter (HOSPITAL_BASED_OUTPATIENT_CLINIC_OR_DEPARTMENT_OTHER)
Admission: RE | Disposition: A | Discharge status: Home / Self Care | Payer: Self-pay | Source: Home / Self Care | Attending: Surgery

## 2019-06-13 ENCOUNTER — Ambulatory Visit (HOSPITAL_BASED_OUTPATIENT_CLINIC_OR_DEPARTMENT_OTHER)
Admission: RE | Admit: 2019-06-13 | Discharge: 2019-06-13 | Disposition: A | Discharge status: Home / Self Care | Payer: Medicare Other | Attending: Surgery | Admitting: Surgery

## 2019-06-13 DIAGNOSIS — Z7901 Long term (current) use of anticoagulants: Secondary | ICD-10-CM | POA: Insufficient documentation

## 2019-06-13 DIAGNOSIS — Z79899 Other long term (current) drug therapy: Secondary | ICD-10-CM | POA: Diagnosis not present

## 2019-06-13 DIAGNOSIS — Z882 Allergy status to sulfonamides status: Secondary | ICD-10-CM | POA: Diagnosis not present

## 2019-06-13 DIAGNOSIS — F329 Major depressive disorder, single episode, unspecified: Secondary | ICD-10-CM | POA: Insufficient documentation

## 2019-06-13 DIAGNOSIS — Z7982 Long term (current) use of aspirin: Secondary | ICD-10-CM | POA: Diagnosis not present

## 2019-06-13 DIAGNOSIS — Z853 Personal history of malignant neoplasm of breast: Secondary | ICD-10-CM | POA: Insufficient documentation

## 2019-06-13 DIAGNOSIS — Z888 Allergy status to other drugs, medicaments and biological substances status: Secondary | ICD-10-CM | POA: Diagnosis not present

## 2019-06-13 DIAGNOSIS — Z880 Allergy status to penicillin: Secondary | ICD-10-CM | POA: Insufficient documentation

## 2019-06-13 DIAGNOSIS — F419 Anxiety disorder, unspecified: Secondary | ICD-10-CM | POA: Diagnosis not present

## 2019-06-13 DIAGNOSIS — Z881 Allergy status to other antibiotic agents status: Secondary | ICD-10-CM | POA: Insufficient documentation

## 2019-06-13 DIAGNOSIS — K828 Other specified diseases of gallbladder: Secondary | ICD-10-CM | POA: Insufficient documentation

## 2019-06-13 DIAGNOSIS — K8010 Calculus of gallbladder with chronic cholecystitis without obstruction: Secondary | ICD-10-CM | POA: Insufficient documentation

## 2019-06-13 HISTORY — PX: CHOLECYSTECTOMY: SHX55

## 2019-06-13 SURGERY — LAPAROSCOPIC CHOLECYSTECTOMY
Anesthesia: General | Site: Abdomen

## 2019-06-13 MED ORDER — EPHEDRINE SULFATE-NACL 50-0.9 MG/10ML-% IV SOSY
PREFILLED_SYRINGE | INTRAVENOUS | Status: DC | PRN
  Administered 2019-06-13: 10 mg via INTRAVENOUS

## 2019-06-13 MED ORDER — MEPERIDINE HCL 25 MG/ML IJ SOLN: 6.2500 mg | INTRAMUSCULAR | Status: DC | PRN

## 2019-06-13 MED ORDER — FENTANYL CITRATE (PF) 250 MCG/5ML IJ SOLN
INTRAMUSCULAR | Status: DC | PRN
  Administered 2019-06-13: 100 ug via INTRAVENOUS

## 2019-06-13 MED ORDER — MIDAZOLAM HCL 5 MG/5ML IJ SOLN
INTRAMUSCULAR | Status: DC | PRN
  Administered 2019-06-13: 2 mg via INTRAVENOUS

## 2019-06-13 MED ORDER — LIDOCAINE 2% (20 MG/ML) 5 ML SYRINGE
INTRAMUSCULAR | Status: AC
  Filled 2019-06-13: qty 5

## 2019-06-13 MED ORDER — GABAPENTIN 300 MG PO CAPS
300.0000 mg | ORAL_CAPSULE | ORAL | Status: AC
  Administered 2019-06-13: 300 mg via ORAL

## 2019-06-13 MED ORDER — BUPIVACAINE HCL (PF) 0.5 % IJ SOLN
INTRAMUSCULAR | Status: AC
  Filled 2019-06-13: qty 30

## 2019-06-13 MED ORDER — LIDOCAINE 2% (20 MG/ML) 5 ML SYRINGE
INTRAMUSCULAR | Status: DC | PRN
  Administered 2019-06-13: 100 mg via INTRAVENOUS

## 2019-06-13 MED ORDER — ROCURONIUM BROMIDE 10 MG/ML (PF) SYRINGE
PREFILLED_SYRINGE | INTRAVENOUS | Status: DC | PRN
  Administered 2019-06-13: 70 mg via INTRAVENOUS

## 2019-06-13 MED ORDER — HYDROMORPHONE HCL 1 MG/ML IJ SOLN
INTRAMUSCULAR | Status: AC
  Filled 2019-06-13: qty 0.5

## 2019-06-13 MED ORDER — METRONIDAZOLE IN NACL 5-0.79 MG/ML-% IV SOLN
500.0000 mg | INTRAVENOUS | Status: AC
Start: 2019-06-13 — End: 2019-06-13
  Administered 2019-06-13: 500 mg via INTRAVENOUS

## 2019-06-13 MED ORDER — CHLORHEXIDINE GLUCONATE CLOTH 2 % EX PADS: 6.0000 | MEDICATED_PAD | Freq: Once | CUTANEOUS | Status: DC

## 2019-06-13 MED ORDER — GABAPENTIN 300 MG PO CAPS
ORAL_CAPSULE | ORAL | Status: AC
  Filled 2019-06-13: qty 1

## 2019-06-13 MED ORDER — PROPOFOL 10 MG/ML IV BOLUS
INTRAVENOUS | Status: DC | PRN
  Administered 2019-06-13: 120 mg via INTRAVENOUS

## 2019-06-13 MED ORDER — MIDAZOLAM HCL 2 MG/2ML IJ SOLN
INTRAMUSCULAR | Status: AC
  Filled 2019-06-13: qty 2

## 2019-06-13 MED ORDER — TRAMADOL HCL 50 MG PO TABS: 50.0000 mg | ORAL_TABLET | Freq: Four times a day (QID) | ORAL | 0 refills | Status: DC | PRN

## 2019-06-13 MED ORDER — FENTANYL CITRATE (PF) 100 MCG/2ML IJ SOLN
INTRAMUSCULAR | Status: AC
  Filled 2019-06-13: qty 2

## 2019-06-13 MED ORDER — LACTATED RINGERS IV SOLN: INTRAVENOUS | Status: DC

## 2019-06-13 MED ORDER — PROPOFOL 10 MG/ML IV BOLUS
INTRAVENOUS | Status: AC
  Filled 2019-06-13: qty 20

## 2019-06-13 MED ORDER — ONDANSETRON HCL 4 MG/2ML IJ SOLN: 4.0000 mg | Freq: Once | INTRAMUSCULAR | Status: DC | PRN

## 2019-06-13 MED ORDER — ONDANSETRON HCL 4 MG/2ML IJ SOLN
INTRAMUSCULAR | Status: DC | PRN
  Administered 2019-06-13: 4 mg via INTRAVENOUS

## 2019-06-13 MED ORDER — BUPIVACAINE-EPINEPHRINE (PF) 0.5% -1:200000 IJ SOLN: INTRAMUSCULAR | Status: DC | PRN

## 2019-06-13 MED ORDER — SUCCINYLCHOLINE CHLORIDE 200 MG/10ML IV SOSY
PREFILLED_SYRINGE | INTRAVENOUS | Status: AC
  Filled 2019-06-13: qty 10

## 2019-06-13 MED ORDER — DEXAMETHASONE SODIUM PHOSPHATE 10 MG/ML IJ SOLN
INTRAMUSCULAR | Status: AC
  Filled 2019-06-13: qty 1

## 2019-06-13 MED ORDER — FENTANYL CITRATE (PF) 100 MCG/2ML IJ SOLN: 50.0000 ug | INTRAMUSCULAR | Status: DC | PRN

## 2019-06-13 MED ORDER — SUGAMMADEX SODIUM 200 MG/2ML IV SOLN
INTRAVENOUS | Status: DC | PRN
  Administered 2019-06-13: 250 mg via INTRAVENOUS

## 2019-06-13 MED ORDER — BUPIVACAINE HCL (PF) 0.5 % IJ SOLN
INTRAMUSCULAR | Status: DC | PRN
  Administered 2019-06-13: 20 mL

## 2019-06-13 MED ORDER — DEXAMETHASONE SODIUM PHOSPHATE 10 MG/ML IJ SOLN
INTRAMUSCULAR | Status: DC | PRN
  Administered 2019-06-13: 4 mg via INTRAVENOUS

## 2019-06-13 MED ORDER — ACETAMINOPHEN 500 MG PO TABS
ORAL_TABLET | ORAL | Status: AC
  Filled 2019-06-13: qty 2

## 2019-06-13 MED ORDER — METRONIDAZOLE IN NACL 5-0.79 MG/ML-% IV SOLN
INTRAVENOUS | Status: AC
  Filled 2019-06-13: qty 100

## 2019-06-13 MED ORDER — SODIUM CHLORIDE 0.9 % IR SOLN
Status: DC | PRN
Start: 2019-06-13 — End: 2019-06-13
  Administered 2019-06-13: 1000 mL

## 2019-06-13 MED ORDER — ACETAMINOPHEN 500 MG PO TABS
1000.0000 mg | ORAL_TABLET | ORAL | Status: AC
  Administered 2019-06-13: 1000 mg via ORAL

## 2019-06-13 MED ORDER — HYDROMORPHONE HCL 1 MG/ML IJ SOLN
0.2500 mg | INTRAMUSCULAR | Status: DC | PRN
  Administered 2019-06-13 (×2): 0.5 mg via INTRAVENOUS

## 2019-06-13 MED ORDER — MIDAZOLAM HCL 2 MG/2ML IJ SOLN: 1.0000 mg | INTRAMUSCULAR | Status: DC | PRN

## 2019-06-13 SURGICAL SUPPLY — 38 items
ADH SKN CLS APL DERMABOND .7 (GAUZE/BANDAGES/DRESSINGS) ×1
APL PRP STRL LF DISP 70% ISPRP (MISCELLANEOUS) ×1
APPLIER CLIP 5 13 M/L LIGAMAX5 (MISCELLANEOUS) ×2
APR CLP MED LRG 5 ANG JAW (MISCELLANEOUS) ×1
BAG SPEC RTRVL LRG 6X4 10 (ENDOMECHANICALS) ×1
BLADE CLIPPER SURG (BLADE) IMPLANT
CHLORAPREP W/TINT 26 (MISCELLANEOUS) ×2 IMPLANT
CLIP APPLIE 5 13 M/L LIGAMAX5 (MISCELLANEOUS) ×1 IMPLANT
COVER MAYO STAND STRL (DRAPES) IMPLANT
COVER WAND RF STERILE (DRAPES) IMPLANT
DECANTER SPIKE VIAL GLASS SM (MISCELLANEOUS) ×1 IMPLANT
DERMABOND ADVANCED (GAUZE/BANDAGES/DRESSINGS) ×1
DERMABOND ADVANCED .7 DNX12 (GAUZE/BANDAGES/DRESSINGS) ×1 IMPLANT
DRAPE C-ARM 42X72 X-RAY (DRAPES) IMPLANT
DRAPE LAPAROSCOPIC ABDOMINAL (DRAPES) ×2 IMPLANT
ELECT REM PT RETURN 9FT ADLT (ELECTROSURGICAL) ×2
ELECTRODE REM PT RTRN 9FT ADLT (ELECTROSURGICAL) ×1 IMPLANT
GLOVE SURG SIGNA 7.5 PF LTX (GLOVE) ×2 IMPLANT
GOWN STRL REUS W/ TWL LRG LVL3 (GOWN DISPOSABLE) ×2 IMPLANT
GOWN STRL REUS W/ TWL XL LVL3 (GOWN DISPOSABLE) ×1 IMPLANT
GOWN STRL REUS W/TWL LRG LVL3 (GOWN DISPOSABLE) ×4
GOWN STRL REUS W/TWL XL LVL3 (GOWN DISPOSABLE) ×2
NS IRRIG 1000ML POUR BTL (IV SOLUTION) ×1 IMPLANT
PACK BASIN DAY SURGERY FS (CUSTOM PROCEDURE TRAY) ×2 IMPLANT
POUCH SPECIMEN RETRIEVAL 10MM (ENDOMECHANICALS) ×2 IMPLANT
SCISSORS LAP 5X35 DISP (ENDOMECHANICALS) ×2 IMPLANT
SET CHOLANGIOGRAPH 5 50 .035 (SET/KITS/TRAYS/PACK) IMPLANT
SET IRRIG TUBING LAPAROSCOPIC (IRRIGATION / IRRIGATOR) ×2 IMPLANT
SET TUBE SMOKE EVAC HIGH FLOW (TUBING) ×2 IMPLANT
SLEEVE ENDOPATH XCEL 5M (ENDOMECHANICALS) ×4 IMPLANT
SLEEVE SCD COMPRESS KNEE MED (MISCELLANEOUS) ×2 IMPLANT
SPECIMEN JAR SMALL (MISCELLANEOUS) ×2 IMPLANT
SUT MON AB 4-0 PC3 18 (SUTURE) ×2 IMPLANT
TOWEL GREEN STERILE FF (TOWEL DISPOSABLE) ×2 IMPLANT
TRAY LAPAROSCOPIC (CUSTOM PROCEDURE TRAY) ×2 IMPLANT
TROCAR XCEL BLUNT TIP 100MML (ENDOMECHANICALS) ×2 IMPLANT
TROCAR XCEL NON-BLD 5MMX100MML (ENDOMECHANICALS) ×2 IMPLANT
TUBE CONNECTING 20X1/4 (TUBING) ×2 IMPLANT

## 2019-06-13 NOTE — Discharge Instructions (Signed)
[facility] ______[facility] LAPAROSCOPIC SURGERY: POST OP INSTRUCTIONS Always review your discharge instruction sheet given to you by the facility where your surgery was performed. IF YOU HAVE DISABILITY OR FAMILY LEAVE FORMS, YOU MUST BRING THEM TO THE OFFICE FOR PROCESSING.   DO NOT GIVE THEM TO YOUR DOCTOR.  1. A prescription for pain medication may be given to you upon discharge.  Take your pain medication as prescribed, if needed.  If narcotic pain medicine is not needed, then you may take acetaminophen (Tylenol) or ibuprofen (Advil) as needed. 2. Take your usually prescribed medications unless otherwise directed. 3. If you need a refill on your pain medication, please contact your pharmacy.  They will contact our office to request authorization. Prescriptions will not be filled after 5pm or on week-ends. 4. You should follow a light diet the first few days after arrival home, such as soup and crackers, etc.  Be sure to include lots of fluids daily. 5. Most patients will experience some swelling and bruising in the area of the incisions.  Ice packs will help.  Swelling and bruising can take several days to resolve.  6. It is common to experience some constipation if taking pain medication after surgery.  Increasing fluid intake and taking a stool softener (such as Colace) will usually help or prevent this problem from occurring.  A mild laxative (Milk of Magnesia or Miralax) should be taken according to package instructions if there are no bowel movements after 48 hours. 7. Unless discharge instructions indicate otherwise, you may remove your bandages 24-48 hours after surgery, and you may shower at that time.  You may have steri-strips (small skin tapes) in place directly over the incision.  These strips should be left on the skin for 7-10 days.  If your surgeon used skin glue on the incision, you may shower in 24 hours.  The glue will flake off over the next 2-3 weeks.  Any sutures or  staples will be removed at the office during your follow-up visit. 8. ACTIVITIES:  You may resume regular (light) daily activities beginning the next day--such as daily self-care, walking, climbing stairs--gradually increasing activities as tolerated.  You may have sexual intercourse when it is comfortable.  Refrain from any heavy lifting or straining until approved by your doctor. a. You may drive when you are no longer taking prescription pain medication, you can comfortably wear a seatbelt, and you can safely maneuver your car and apply brakes. b. RETURN TO WORK:  __________________________________________________________ 9. You should see your doctor in the office for a follow-up appointment approximately 2-3 weeks after your surgery.  Make sure that you call for this appointment within a day or two after you arrive home to insure a convenient appointment time. 10. OTHER INSTRUCTIONS:OK TO SHOWER STARTING TOMORROW 11. ICE PACK, TYLENOL, IBUPROFEN ALSO FOR PAIN 12. NO LIFTING MORE THAN 15 TO 20 POUNDS FOR 2 WEEKS __________________________________________________________________________________________________________________________ __________________________________________________________________________________________________________________________ WHEN TO CALL YOUR DOCTOR: 1. Fever over 101.0 2. Inability to urinate 3. Continued bleeding from incision. 4. Increased pain, redness, or drainage from the incision. 5. Increasing abdominal pain  The clinic staff is available to answer your questions during regular business hours.  Please don't hesitate to call and ask to speak to one of the nurses for clinical concerns.  If you have a medical emergency, go to the nearest emergency room or call 911.  A surgeon from [facility] is always on call at the hospital. [address] [employer]  [address] [place], [place]  [address] ? [address] [place], [place], [place]   [phone] ?  [phone] ? FAX [phone] site: [email or web address]   NO TYLENOL PRODUCTS UNTIL 1:30 PM      Post Anesthesia Home Care Instructions  Activity: Get plenty of rest for the remainder of the day. A responsible individual must stay with you for 24 hours following the procedure.  For the next 24 hours, DO NOT: -Drive a car -Paediatric nurse -Drink alcoholic beverages -Take any medication unless instructed by your physician -Make any legal decisions or sign important papers.  Meals: Start with liquid foods such as gelatin or soup. Progress to regular foods as tolerated. Avoid greasy, spicy, heavy foods. If nausea and/or vomiting occur, drink only clear liquids until the nausea and/or vomiting subsides. Call your physician if vomiting continues.  Special Instructions/Symptoms: Your throat may feel dry or sore from the anesthesia or the breathing tube placed in your throat during surgery. If this causes discomfort, gargle with warm salt water. The discomfort should disappear within 24 hours.  If you had a scopolamine patch placed behind your ear for the management of post- operative nausea and/or vomiting:  1. The medication in the patch is effective for 72 hours, after which it should be removed.  Wrap patch in a tissue and discard in the trash. Wash hands thoroughly with soap and water. 2. You may remove the patch earlier than 72 hours if you experience unpleasant side effects which may include dry mouth, dizziness or visual disturbances. 3. Avoid touching the patch. Wash your hands with soap and water after contact with the patch.

## 2019-06-13 NOTE — Op Note (Signed)
Laparoscopic Cholecystectomy Procedure Note  Indications: This patient presents with symptomatic gallbladder disease and will undergo laparoscopic cholecystectomy.  Pre-operative Diagnosis: chronic cholecystitis with gallstones  Post-operative Diagnosis: same  Surgeon: Coralie Keens   Assistants: 0  Anesthesia: General endotracheal anesthesia  ASA Class: 2  Procedure Details  The patient was seen again in the Holding Room. The risks, benefits, complications, treatment options, and expected outcomes were discussed with the patient. The possibilities of reaction to medication, pulmonary aspiration, perforation of viscus, bleeding, recurrent infection, finding a normal gallbladder, the need for additional procedures, failure to diagnose a condition, the possible need to convert to an open procedure, and creating a complication requiring transfusion or operation were discussed with the patient. The likelihood of improving the patient's symptoms with return to their baseline status is good.  The patient and/or family concurred with the proposed plan, giving informed consent. The site of surgery properly noted. The patient was taken to Operating Room, identified as Dawn West and the procedure verified as Laparoscopic Cholecystectomy with Intraoperative Cholangiogram. A Time Out was held and the above information confirmed.  Prior to the induction of general anesthesia, antibiotic prophylaxis was administered. General endotracheal anesthesia was then administered and tolerated well. After the induction, the abdomen was prepped with Chloraprep and draped in sterile fashion. The patient was positioned in the supine position.  Local anesthetic agent was injected into the skin near the umbilicus and an incision made. We dissected down to the abdominal fascia with blunt dissection.  The fascia was incised vertically and we entered the peritoneal cavity bluntly.  A pursestring suture of 0-Vicryl  was placed around the fascial opening.  The Hasson cannula was inserted and secured with the stay suture.  Pneumoperitoneum was then created with CO2 and tolerated well without any adverse changes in the patient's vital signs. A 5-mm port was placed in the subxiphoid position.  Two 5-mm ports were placed in the right upper quadrant. All skin incisions were infiltrated with a local anesthetic agent before making the incision and placing the trocars.   We positioned the patient in reverse Trendelenburg, tilted slightly to the patient's left.  The gallbladder was identified, the fundus grasped and retracted cephalad.  Omentum was stuck to the internal of the gallbladder.  Gallbladder adhesions were lysed bluntly and with the electrocautery where indicated, taking care not to injure any adjacent organs or viscus. The infundibulum was grasped and retracted laterally, exposing the peritoneum overlying the triangle of Calot.  After grasping the gallbladder at the dome, it opened up and purulence came out of the gallbladder.  Several stones came out as well.  The base of the gallbladder appeared normal.. The cystic duct was clearly identified and bluntly dissected circumferentially. A critical view of the cystic duct and cystic artery was obtained.  The cystic duct was then ligated with clips and divided. The cystic artery was, dissected free, ligated with clips and divided as well.   The gallbladder was dissected from the liver bed in retrograde fashion with the electrocautery. The gallbladder was removed and placed in an Endocatch sac along with multiple gallstones. The liver bed was irrigated and inspected. Hemostasis was achieved with the electrocautery. Copious irrigation was utilized and was repeatedly aspirated until clear.  The gallbladder and Endocatch sac were then removed through the umbilical port site.  The pursestring suture was used to close the umbilical fascia.    We again inspected the right upper  quadrant for hemostasis.  Pneumoperitoneum was released  as we removed the trocars.  4-0 Monocryl was used to close the skin.   Benzoin, steri-strips, and clean dressings were applied. The patient was then extubated and brought to the recovery room in stable condition. Instrument, sponge, and needle counts were correct at closure and at the conclusion of the case.   Findings: Chronic Cholecystitis with Cholelithiasis  Estimated Blood Loss: Minimal         Drains: 0         Specimens: Gallbladder           Complications: None; patient tolerated the procedure well.         Disposition: PACU - hemodynamically stable.         Condition: stable

## 2019-06-13 NOTE — Anesthesia Preprocedure Evaluation (Signed)
Anesthesia Evaluation  Patient identified by MRN, date of birth, ID band Patient awake    Reviewed: Allergy & Precautions, NPO status , Patient's Chart, lab work & pertinent test results  History of Anesthesia Complications (+) PONV  Airway Mallampati: I  TM Distance: >3 FB Neck ROM: Full    Dental   Pulmonary    Pulmonary exam normal        Cardiovascular Normal cardiovascular exam     Neuro/Psych Anxiety Depression    GI/Hepatic   Endo/Other    Renal/GU      Musculoskeletal   Abdominal   Peds  Hematology   Anesthesia Other Findings   Reproductive/Obstetrics                             Anesthesia Physical Anesthesia Plan  ASA: II  Anesthesia Plan: General   Post-op Pain Management:    Induction: Intravenous  PONV Risk Score and Plan: 4 or greater and Midazolam, Dexamethasone, Ondansetron and Treatment may vary due to age or medical condition  Airway Management Planned: Oral ETT  Additional Equipment:   Intra-op Plan:   Post-operative Plan: Extubation in OR  Informed Consent: I have reviewed the patients History and Physical, chart, labs and discussed the procedure including the risks, benefits and alternatives for the proposed anesthesia with the patient or authorized representative who has indicated his/her understanding and acceptance.       Plan Discussed with: CRNA and Surgeon  Anesthesia Plan Comments:         Anesthesia Quick Evaluation

## 2019-06-13 NOTE — Anesthesia Procedure Notes (Signed)
Procedure Name: Intubation Date/Time: 06/13/2019 8:26 AM Performed by: Myna Bright, CRNA Pre-anesthesia Checklist: Patient identified, Emergency Drugs available, Suction available and Patient being monitored Patient Re-evaluated:Patient Re-evaluated prior to induction Oxygen Delivery Method: Circle system utilized Preoxygenation: Pre-oxygenation with 100% oxygen Induction Type: IV induction Ventilation: Mask ventilation without difficulty Laryngoscope Size: Mac and 3 Grade View: Grade I Tube type: Oral Tube size: 7.0 mm Number of attempts: 1 Airway Equipment and Method: Stylet Placement Confirmation: ETT inserted through vocal cords under direct vision,  breath sounds checked- equal and bilateral and positive ETCO2 Secured at: 21 cm Tube secured with: Tape Dental Injury: Teeth and Oropharynx as per pre-operative assessment

## 2019-06-13 NOTE — Interval H&P Note (Signed)
History and Physical Interval Note:no change in H and P  06/13/2019 7:57 AM  Dawn West  has presented today for surgery, with the diagnosis of SYMPTOMATIC CHOLELITHIASIS.  The various methods of treatment have been discussed with the patient and family. After consideration of risks, benefits and other options for treatment, the patient has consented to  Procedure(s): LAPAROSCOPIC CHOLECYSTECTOMY (N/A) as a surgical intervention.  The patient's history has been reviewed, patient examined, no change in status, stable for surgery.  I have reviewed the patient's chart and labs.  Questions were answered to the patient's satisfaction.     Coralie Keens

## 2019-06-13 NOTE — Transfer of Care (Signed)
Immediate Anesthesia Transfer of Care Note  Patient: Dawn West  Procedure(s) Performed: LAPAROSCOPIC CHOLECYSTECTOMY (N/A Abdomen)  Patient Location: PACU  Anesthesia Type:General  Level of Consciousness: awake, alert  and oriented  Airway & Oxygen Therapy: Patient Spontanous Breathing and Patient connected to face mask oxygen  Post-op Assessment: Report given to RN and Post -op Vital signs reviewed and stable  Post vital signs: Reviewed and stable  Last Vitals:  Vitals Value Taken Time  BP    Temp    Pulse    Resp 15 06/13/19 0915  SpO2    Vitals shown include unvalidated device data.  Last Pain:  Vitals:   06/13/19 0722  TempSrc: Oral  PainSc: 7       Patients Stated Pain Goal: 4 ([id number])  Complications: No apparent anesthesia complications

## 2019-06-13 NOTE — Anesthesia Postprocedure Evaluation (Signed)
Anesthesia Post Note  Patient: Dawn West  Procedure(s) Performed: LAPAROSCOPIC CHOLECYSTECTOMY (N/A Abdomen)     Patient location during evaluation: PACU Anesthesia Type: General Level of consciousness: awake and alert Pain management: pain level controlled Vital Signs Assessment: post-procedure vital signs reviewed and stable Respiratory status: spontaneous breathing, nonlabored ventilation, respiratory function stable and patient connected to nasal cannula oxygen Cardiovascular status: blood pressure returned to baseline and stable Postop Assessment: no apparent nausea or vomiting Anesthetic complications: no    Last Vitals:  Vitals:   06/13/19 1030 06/13/19 1136  BP:  125/64  Pulse: 71 67  Resp: 18 16  Temp:  (!) 36.3 C  SpO2: 97% 96%    Last Pain:  Vitals:   06/13/19 1136  TempSrc: Oral  PainSc: 6                  Dore Oquin DAVID

## 2019-06-14 LAB — SURGICAL PATHOLOGY

## 2019-06-19 ENCOUNTER — Encounter: Payer: Self-pay | Admitting: *Deleted

## 2019-08-17 NOTE — Progress Notes (Signed)
[place]   [phone] [phone]   Clinic Follow up Note   Patient Care Team: Garth Bigness as PCP - General (Physician Assistant) Kem Boroughs, [place] as Nurse Practitioner (Family Medicine) [name], MD as Consulting Physician (General Surgery) Truitt Merle, MD as Consulting Physician (Hematology) Gery Pray, MD as Consulting Physician (Radiation Oncology)  Date of Service:  08/23/2019  CHIEF COMPLAINT: F/u of right breast cancer and anemia   SUMMARY OF ONCOLOGIC HISTORY: Oncology History Overview Note  Cancer Staging Breast cancer of upper-outer quadrant of right female breast [facility]) Staging form: Breast, AJCC 8th Edition - Clinical: Stage 0 (cTis (DCIS), cN0, cM0, ER: Negative, PR: Negative) - Signed by Truitt Merle, MD on 05/25/2016 - Pathologic stage from 06/02/2016: Stage IA (pT1a, pN0, cM0, G2, ER: Negative, PR: Negative, HER2: Positive) - Signed by Truitt Merle, MD on 11/29/2016     Breast cancer of upper-outer quadrant of right female breast ([place])  05/18/2016 Mammogram   Diagnostic mammogram of the right breast showed heterogeneous calcifications in a linear orientation within the posterior upper right breast and spanning a distance of 2.2 cm. No associated mass identified.   05/19/2016 Initial Biopsy   Right breast core needle biopsy showed ductal carcinoma with calcification, high-grade, focal highly suspicious for stromal invasion.   05/19/2016 Receptors her2   ER negative, PR negative   05/19/2016 Initial Diagnosis   Ductal carcinoma in situ (DCIS) of right breast   06/02/2016 Surgery   RIGHT BREAST LUMPECTOMY WITH RADIOACTIVE SEED AND SENTINEL LYMPH NODE BIOPSY by Dr. Barry Dienes   06/02/2016 Pathology Results   Diagnosis 06/02/16 1. Breast, lumpectomy, Right w/seed - INVASIVE DUCTAL CARCINOMA, GRADE 2, SPANNING 0.3 CM. - HIGH GRADE DUCTAL CARCINOMA IN SITU WITH NECROSIS. - INVASIVE CARCINOMA COMES TO WITHIN <0.1 CM OF THE  POSTERIOR MARGIN FOCALLY. - DUCTAL CARCINOMA IN SITU COMES TO WITHIN <0.1 CM OF THE POSTERIOR MARGIN FOCALLY AND THE SUPERIOR MARGIN FOCALLY. - BIOPSY SITE. - SEE ONCOLOGY TABLE. 2. Breast, excision, Right additional Medial Margin - FIBROCYSTIC CHANGE. - USUAL DUCTAL HYPERPLASIA. - NO MALIGNANCY IDENTIFIED. 3. Lymph node, sentinel, biopsy, Right Axillary #1 - ONE OF ONE LYMPH NODES NEGATIVE FOR CARCINOMA (0/1). 4. Lymph node, sentinel, biopsy, Right Axillary - ONE OF ONE LYMPH NODES NEGATIVE FOR CARCINOMA (0/1).   06/02/2016 Receptors her2   ER-, PR-, HER2+    07/26/2016 Surgery   RE-EXCISION OF RIGHT BREAST LUMPECTOMY by Dr. Barry Dienes   07/26/2016 Pathology Results   Diagnosis 07/26/16 Breast, excision, Right new Superior Margin - DUCTAL CARCINOMA IN SITU WITH CALCIFICATIONS, HIGH GRADE, SPANNING AT LEAST 1.5 CM. - FOCI HIGHLY SUSPICIOUS FOR EARLY STROMAL INVASION. - DUCTAL CARCINOMA IN SITU IS BROADLY LESS THAN 0.1 CM TO THE NEW SUPERIOR MARGIN. - SEE COMMENT.   08/26/2016 Pathology Results   Diagnosis 08/26/16 Breast, right, needle core biopsy, superior anterior - HIGH GRADE, DUCTAL CARCINOMA IN SITU - SEE COMMENT   09/06/2016 Surgery   RE-EXCISION OF RIGHT BREAST LUMPECTOMY WITH RADIOACTIVE SEED LOCALIZATION by Dr. Barry Dienes   09/06/2016 Pathology Results   Diagnosis 09/06/16 1. Breast, excision, Right Superior Margin - INVASIVE DUCTAL CARCINOMA, GRADE III/III, SCATTERED MICROSCOPIC FOCI. - DUCTAL CARCINOMA IN SITU WITH CALCIFICATIONS, HIGH GRADE. - INVASIVE CARCINOMA IS FOCALLY LESS THAN 0.1 CM TO THE INFERIOR MARGIN OF SPECIMEN #1.. - DUCTAL CARCINOMA IN SITU IS BROADLY LESS THAN 0.1 CM TO THE LATERAL MARGIN, FOCALLY LESS THAN 0.1 CM TO THE INFERIOR MARGIN, AND FOCALLY LESS THAN 0.1  CM TO THE ANTERIOR MARGIN OF SPECIMEN #1. - SEE COMMENT. 2. Breast, excision, Right Superolateral Margin - FIBROCYSTIC CHANGES. - THERE IS NO EVIDENCE OF MALIGNANCY. - SEE COMMENT.   10/13/2016 -  11/30/2016 Radiation Therapy   Radiation by Dr. Sondra Come  Radiation treatment dates:   10/13/16 - 11/30/16 Site/dose:    1) Right Breast treated to 50.4 Gy in 28 fractions 2) Right Breast Boosted an additional 12 Gy in 6 fractions             Beams/energy:    1) 3D  //  6X 2) 3D  //  6X Narrative: The patient tolerated radiation treatment relatively well. The patient experienced occasional mild pain in her right breast. She also reported some fatigue and appetite reduction throughout treatment, though this did not affect her activities of daily living. She experienced some radiation related skin changes including moderate erythema across the right breast.    05/31/2017 Mammogram   Diagnostic Mammogram 05/31/17 IMPRESSION: No mammographic evidence of malignancy. RECOMMENDATION: Annual diagnostic mammography.      CURRENT THERAPY:  Surveillance   INTERVAL HISTORY:  Dawn West is here for a follow up of right breast cancer. She was last seen by me 1 year ago. She presents to the clinic alone. She notes she had cholecystectomy on 06/13/19. She notes she has mild swelling in intestines. Her oral iron was stopped in 05/2019 but Dr Collene Mares because she had high iron level. She notes she is busy and more fatigued lately. She feels she has intermittent dysuria for the past 3 weeks. She plans to proceed with COVID19 vaccine next week. She notes her husband died in the last year from Leukemia. She lives by herself now.     REVIEW OF SYSTEMS:   Constitutional: Denies fevers, chills or abnormal weight loss (+) fatigue  Eyes: Denies blurriness of vision Ears, nose, mouth, throat, and face: Denies mucositis or sore throat Respiratory: Denies cough, dyspnea or wheezes Cardiovascular: Denies palpitation, chest discomfort or lower extremity swelling Gastrointestinal:  Denies nausea, heartburn or change in bowel habits Skin: Denies abnormal skin rashes UA: (+) Intermittent dysuria  Lymphatics: Denies  new lymphadenopathy or easy bruising Neurological:Denies numbness, tingling or new weaknesses Behavioral/Psych: Mood is stable, no new changes  All other systems were reviewed with the patient and are negative.  MEDICAL HISTORY:  Past Medical History:  Diagnosis Date  . Anxiety   . Arthritis    knees  . Breast cancer ([place]) 2018   Right Breast  . Cancer ([place]) 12/01/11   basal cell skin ca Left upper arm  . Cancer ([place]) 04/20/2016   right breast cancer  . Complication of anesthesia   . Depression   . Headache   . History of radiation therapy 10/13/16-11/30/16   right breast 50.4 Gy in 28 fractions, right breast boost 12 Gy in 6 fractions  . Hypercholesteremia   . Personal history of radiation therapy 2018   Right Breast Cancer  . PONV (postoperative nausea and vomiting)     SURGICAL HISTORY: Past Surgical History:  Procedure Laterality Date  . ABDOMINAL HYSTERECTOMY    . BREAST LUMPECTOMY Right 2018  . BREAST LUMPECTOMY WITH RADIOACTIVE SEED AND SENTINEL LYMPH NODE BIOPSY Right 06/02/2016   Procedure: RIGHT BREAST LUMPECTOMY WITH RADIOACTIVE SEED AND SENTINEL LYMPH NODE BIOPSY;  Surgeon: [name], MD;  Location: [place];  Service: General;  Laterality: Right;  . BREAST LUMPECTOMY WITH RADIOACTIVE SEED LOCALIZATION Right 09/06/2016   Procedure:  RE-EXCISION OF RIGHT BREAST LUMPECTOMY WITH RADIOACTIVE SEED LOCALIZATION;  Surgeon: [name], MD;  Location: [place];  Service: General;  Laterality: Right;  . BREAST SURGERY  05/19/2016   right breast biopsy  . CHOLECYSTECTOMY N/A 06/13/2019   Procedure: LAPAROSCOPIC CHOLECYSTECTOMY;  Surgeon: Coralie Keens, MD;  Location: [place];  Service: General;  Laterality: N/A;  . KNEE ARTHROPLASTY Left 06/10/2016   Procedure: LEFT TOTAL KNEE ARTHROPLASTY WITH COMPUTER NAVIGATION;  Surgeon: Rod Can, MD;  Location: [facility];  Service: Orthopedics;  Laterality: Left;  . KNEE  ARTHROPLASTY Right 01/20/2017   Procedure: RIGHT TOTAL KNEE ARTHROPLASTY WITH COMPUTER NAVIGATION;  Surgeon: Rod Can, MD;  Location: [facility];  Service: Orthopedics;  Laterality: Right;  Adductor Block  . MOUTH SURGERY    . RE-EXCISION OF BREAST LUMPECTOMY Right 07/26/2016   Procedure: RE-EXCISION OF RIGHT BREAST LUMPECTOMY;  Surgeon: [name], MD;  Location: [facility];  Service: General;  Laterality: Right;  . SKIN CANCER EXCISION Left 2013   upper arm, basal cell  . TONSILLECTOMY AND ADENOIDECTOMY     (DEVIATED SEPTUM  SAME TIME)  . TOTAL VAGINAL HYSTERECTOMY  12/1996   menorrhagia, fibroids    I have reviewed the social history and family history with the patient and they are unchanged from previous note.  ALLERGIES:  is allergic to tramadol; allegra [fexofenadine hcl]; ciprofloxacin; estradiol; oxycodone; penicillins; and sulfa antibiotics.  MEDICATIONS:  Current Outpatient Medications  Medication Sig Dispense Refill  . polyethylene glycol (MIRALAX / GLYCOLAX) 17 g packet Take 17 g by mouth daily as needed.    . Probiotic Product (PROBIOTIC DAILY PO) Take by mouth.    Marland Kitchen acetaminophen (TYLENOL) 500 MG tablet Take 500 mg by mouth every 6 (six) hours as needed.    . Calcium Carb-Cholecalciferol (CALCIUM 600 + D) 600-200 MG-UNIT TABS Take 1 tablet by mouth 2 (two) times daily.     . cetirizine (ZYRTEC) 10 MG tablet Take 10 mg by mouth at bedtime. At 10 pm daily    . diclofenac (VOLTAREN) 75 MG EC tablet Take 75 mg by mouth 2 (two) times daily.    Marland Kitchen docusate sodium (COLACE) 100 MG capsule Take 1 capsule (100 mg total) by mouth 2 (two) times daily. 60 capsule 1  . fenofibrate micronized (LOFIBRA) 134 MG capsule Take 134 mg by mouth daily before breakfast.    . fluticasone (FLONASE) 50 MCG/ACT nasal spray Place 1 spray into both nostrils daily.     . Glucos-Chond-Hyal Ac-Ca Fructo (MOVE FREE JOINT HEALTH ADVANCE PO) Take 1 tablet by mouth daily.    . Magnesium 250 MG TABS Take 250 mg by  mouth 3 (three) times daily.     . Melatonin 10 MG CAPS Take by mouth.    . Multiple Vitamin (MULTIVITAMIN) tablet Take 1 tablet by mouth daily.    . Omega-3 Fatty Acids (FISH OIL) 1000 MG CAPS Take 1,000 mg by mouth 3 (three) times daily. Takes 1-3 times daily    . sertraline (ZOLOFT) 100 MG tablet Take 1 tablet (100 mg total) by mouth daily. (Patient taking differently: Take 100 mg by mouth daily. ) 90 tablet 4  . sodium chloride (OCEAN) 0.65 % SOLN nasal spray Place 1 spray into both nostrils 2 (two) times daily as needed for congestion.    Marland Kitchen tetrahydrozoline 0.05 % ophthalmic solution Place 1 drop into both eyes daily as needed (dry eyes).    . traZODone (DESYREL) 100 MG tablet Take 100  mg by mouth at bedtime.    . Vitamin D, Ergocalciferol, (DRISDOL) 50000 units CAPS capsule Take 1 capsule (50,000 Units total) by mouth See admin instructions. Takes every other week on Friday 30 capsule 2  . vitamin E 400 UNIT capsule Take 400 Units by mouth 3 (three) times daily.     Marland Kitchen zinc gluconate 50 MG tablet Take 50 mg by mouth daily.     No current facility-administered medications for this visit.    PHYSICAL EXAMINATION: ECOG PERFORMANCE STATUS: 1 - Symptomatic but completely ambulatory  Vitals:   08/23/19 0855  BP: (!) 148/80  Pulse: 73  Resp: 20  Temp: 98.5 F (36.9 C)  SpO2: 98%   Filed Weights   08/23/19 0855  Weight: 139 lb 4.8 oz (63.2 kg)    GENERAL:alert, no distress and comfortable SKIN: skin color, texture, turgor are normal, no rashes or significant lesions EYES: normal, Conjunctiva are pink and non-injected, sclera clear  NECK: supple, thyroid normal size, non-tender, without nodularity LYMPH:  no palpable lymphadenopathy in the cervical, axillary  LUNGS: clear to auscultation and percussion with normal breathing effort HEART: regular rate & rhythm and no murmurs and no lower extremity edema ABDOMEN:abdomen soft, non-tender and normal bowel sounds (+) 3 incisions healed  well Musculoskeletal:no cyanosis of digits and no clubbing  NEURO: alert & oriented x 3 with fluent speech, no focal motor/sensory deficits BREAST: S/p right lumpectomy: surgical incision healed well with scar tissue around nipple. No palpable mass, nodules or adenopathy bilaterally. Breast exam benign.   LABORATORY DATA:  I have reviewed the data as listed CBC Latest Ref Rng & Units 08/23/2019 02/07/2018 11/30/2017  WBC 4.0 - 10.5 K/uL 4.7 4.1 6.7  Hemoglobin 12.0 - 15.0 g/dL 11.3(L) 12.4 10.4(L)  Hematocrit 36.0 - 46.0 % 34.3(L) 36.8 30.3(L)  Platelets 150 - 400 K/uL 287 293 515(H)     CMP Latest Ref Rng & Units 08/23/2019 02/07/2018 11/30/2017  Glucose 70 - 99 mg/dL 98 97 100(H)  BUN 8 - 23 mg/dL 16 24(H) 26(H)  Creatinine 0.44 - 1.00 mg/dL 0.86 0.88 0.98  Sodium 135 - 145 mmol/L 143 140 140  Potassium 3.5 - 5.1 mmol/L 4.1 4.5 4.3  Chloride 98 - 111 mmol/L 108 105 103  CO2 22 - 32 mmol/L '26 28 26  '$ Calcium 8.9 - 10.3 mg/dL 9.2 9.6 9.9  Total Protein 6.5 - 8.1 g/dL 6.6 7.2 7.5  Total Bilirubin 0.3 - 1.2 mg/dL 0.2(L) 0.3 <0.2(L)  Alkaline Phos 38 - 126 U/L 48 49 56  AST 15 - 41 U/L '16 17 16  '$ ALT 0 - 44 U/L '18 18 24      '$ RADIOGRAPHIC STUDIES: I have personally reviewed the radiological images as listed and agreed with the findings in the report. No results found.   ASSESSMENT & PLAN:  Dawn West is a 69 y.o. female with    1. Breast cancer of upper outer quadrant of right breast, invasive ductal carcinoma, pT1aN0M0, stage IA, ER-/PR-/HER2+, and high grade DCIS ER-/PR- -She was diagnosed in 05/2016. She is s/p right breast lumpectomy and 2 breast re-excision surgeries and s/p adjuvant RT.  -Wepreviouslydiscussed a small risk of breast cancer recurrence. Due to the small size of the invasive ductal carcinoma, she would not need adjuvant chemotherapy. However given the ER/PR negative and HER-2 positive disease, the biology of her cancer is more aggressive, I recommend her  close surveillance. -She is clinically doing well. Lab reviewed, her CBC and CMP are  within normal limits except Hg 11.3. Her physical exam and her 05/2019 mammogram were unremarkable. There is no clinical concern for recurrence. -Continue surveillance. Next mammogram in 05/2020. I encouraged her to watch for big bone pain, weight loss, RUQ pain and loss of appetite that is unexplained. She understands.  -F/u in 1 year with NP Lacie and see Dr Barry Dienes in 6 months    2. Iron deficient Anemia with thrombocytosis -Based on Epicrecords,she has been mildly anemia since early 2018.  -Per pt she had a colonoscopy in 2019 which was unremarkable. Anemia workup showed iron deficiency and otherwise negative.  -She started OTC oral ferrous sulfate 1-2 times daily in summer 2019. Anemia and thrombocytosis resolved on 02/2018 labs.  -Her 05/15/19 labs with Dr Collene Mares had elevated Ferritin and her oral iron was stopped. I discussed the elevation was likely related to the inflammation in her gallbladder. -Hg today is 11.3 (08/23/19) and moderately symptomatic with fatigue. I recommend she restart oral iron BID for 1 month then reduce to once daily again. She is agreeable.  -Repeat labs in 3-6 months.    3. Osteopenia  -Her 05/2018 DEXA shows osteopenia with lowest T-score of -1.6 at left hip. -Continue Calcium and VitD and remain active with weight bearing exercise. Given her 2 knee replacement her orthopedist did not recommend walking.  -Will monitor every 2 years, next in 05/2019. If significantly worsens may consider bisphosphonate intervention.   4. S/p cholecystectomy, Dysuria  -Due to abdominal pain which started Thanksgiving she had work up and ultimately underwent cholecystectomy on 06/13/19.  -She notes mild discomfort or pain from her inflammation of intestines post surgery. She is overall recovering well.  -She does have Dysuria intermittently for the past 3 weeks. Will order UA and culture to evaluate  for UTI.    Plan -UA and urine culture today  -Restart oral iron  -Lab and F/u in 1 year with NP Lacie, will send a message to Dr. Barry Dienes for her visit in 6 months  -lab in 4 months to f/u anemia  -Mammogram and DEXA in 05/2020   No problem-specific Assessment & Plan notes found for this encounter.   Orders Placed This Encounter  Procedures  . Urine Culture    Standing Status:   Future    Number of Occurrences:   1    Standing Expiration Date:   08/22/2020  . MM DIAG BREAST TOMO BILATERAL    Standing Status:   Future    Standing Expiration Date:   08/22/2020    Order Specific Question:   Reason for Exam (SYMPTOM  OR DIAGNOSIS REQUIRED)    Answer:   screening    Order Specific Question:   Preferred imaging location?    Answer:   [facility]  . DG Bone Density    Standing Status:   Future    Standing Expiration Date:   08/22/2020    Order Specific Question:   Reason for Exam (SYMPTOM  OR DIAGNOSIS REQUIRED)    Answer:   screening    Order Specific Question:   Preferred imaging location?    Answer:   [facility]  . Urinalysis, Complete w Microscopic    Standing Status:   Future    Number of Occurrences:   1    Standing Expiration Date:   08/22/2020   All questions were answered. The patient knows to call the clinic with any problems, questions or concerns. No barriers to learning was detected. The total time spent  in the appointment was 25 minutes.     Truitt Merle, MD 08/23/2019   I, Joslyn Devon, am acting as scribe for Truitt Merle, MD.   I have reviewed the above documentation for accuracy and completeness, and I agree with the above.

## 2019-08-23 ENCOUNTER — Other Ambulatory Visit: Payer: Self-pay

## 2019-08-23 ENCOUNTER — Encounter: Payer: Self-pay | Admitting: Hematology

## 2019-08-23 ENCOUNTER — Telehealth: Payer: Self-pay | Admitting: Hematology

## 2019-08-23 ENCOUNTER — Inpatient Hospital Stay: Payer: Medicare Other | Attending: Hematology | Admitting: Hematology

## 2019-08-23 ENCOUNTER — Inpatient Hospital Stay: Payer: Medicare Other

## 2019-08-23 VITALS — BP 148/80 | HR 73 | Temp 98.5°F | Resp 20 | Ht 62.0 in | Wt 139.3 lb

## 2019-08-23 DIAGNOSIS — Z85828 Personal history of other malignant neoplasm of skin: Secondary | ICD-10-CM | POA: Insufficient documentation

## 2019-08-23 DIAGNOSIS — F419 Anxiety disorder, unspecified: Secondary | ICD-10-CM | POA: Insufficient documentation

## 2019-08-23 DIAGNOSIS — F329 Major depressive disorder, single episode, unspecified: Secondary | ICD-10-CM | POA: Insufficient documentation

## 2019-08-23 DIAGNOSIS — R300 Dysuria: Secondary | ICD-10-CM

## 2019-08-23 DIAGNOSIS — M8580 Other specified disorders of bone density and structure, unspecified site: Secondary | ICD-10-CM | POA: Diagnosis not present

## 2019-08-23 DIAGNOSIS — Z171 Estrogen receptor negative status [ER-]: Secondary | ICD-10-CM | POA: Diagnosis not present

## 2019-08-23 DIAGNOSIS — Z79899 Other long term (current) drug therapy: Secondary | ICD-10-CM | POA: Diagnosis not present

## 2019-08-23 DIAGNOSIS — E7800 Pure hypercholesterolemia, unspecified: Secondary | ICD-10-CM | POA: Insufficient documentation

## 2019-08-23 DIAGNOSIS — C50411 Malignant neoplasm of upper-outer quadrant of right female breast: Secondary | ICD-10-CM | POA: Diagnosis not present

## 2019-08-23 DIAGNOSIS — D649 Anemia, unspecified: Secondary | ICD-10-CM | POA: Diagnosis not present

## 2019-08-23 DIAGNOSIS — R7989 Other specified abnormal findings of blood chemistry: Secondary | ICD-10-CM | POA: Diagnosis not present

## 2019-08-23 DIAGNOSIS — E2839 Other primary ovarian failure: Secondary | ICD-10-CM

## 2019-08-23 DIAGNOSIS — D509 Iron deficiency anemia, unspecified: Secondary | ICD-10-CM | POA: Diagnosis not present

## 2019-08-23 DIAGNOSIS — Z853 Personal history of malignant neoplasm of breast: Secondary | ICD-10-CM | POA: Diagnosis present

## 2019-08-23 LAB — URINALYSIS, COMPLETE (UACMP) WITH MICROSCOPIC
Bacteria, UA: NONE SEEN
Bilirubin Urine: NEGATIVE
Glucose, UA: NEGATIVE mg/dL
Hgb urine dipstick: NEGATIVE
Ketones, ur: NEGATIVE mg/dL
Nitrite: NEGATIVE
Protein, ur: NEGATIVE mg/dL
Specific Gravity, Urine: 1.021 (ref 1.005–1.030)
pH: 6 (ref 5.0–8.0)

## 2019-08-23 LAB — CBC WITH DIFFERENTIAL/PLATELET
Abs Immature Granulocytes: 0.01 10*3/uL (ref 0.00–0.07)
Basophils Absolute: 0 10*3/uL (ref 0.0–0.1)
Basophils Relative: 1 %
Eosinophils Absolute: 0.3 10*3/uL (ref 0.0–0.5)
Eosinophils Relative: 7 %
HCT: 34.3 % — ABNORMAL LOW (ref 36.0–46.0)
Hemoglobin: 11.3 g/dL — ABNORMAL LOW (ref 12.0–15.0)
Immature Granulocytes: 0 %
Lymphocytes Relative: 21 %
Lymphs Abs: 1 10*3/uL (ref 0.7–4.0)
MCH: 30 pg (ref 26.0–34.0)
MCHC: 32.9 g/dL (ref 30.0–36.0)
MCV: 91 fL (ref 80.0–100.0)
Monocytes Absolute: 0.4 10*3/uL (ref 0.1–1.0)
Monocytes Relative: 9 %
Neutro Abs: 2.9 10*3/uL (ref 1.7–7.7)
Neutrophils Relative %: 62 %
Platelets: 287 10*3/uL (ref 150–400)
RBC: 3.77 MIL/uL — ABNORMAL LOW (ref 3.87–5.11)
RDW: 13.1 % (ref 11.5–15.5)
WBC: 4.7 10*3/uL (ref 4.0–10.5)
nRBC: 0 % (ref 0.0–0.2)

## 2019-08-23 LAB — COMPREHENSIVE METABOLIC PANEL
ALT: 18 U/L (ref 0–44)
AST: 16 U/L (ref 15–41)
Albumin: 3.7 g/dL (ref 3.5–5.0)
Alkaline Phosphatase: 48 U/L (ref 38–126)
Anion gap: 9 (ref 5–15)
BUN: 16 mg/dL (ref 8–23)
CO2: 26 mmol/L (ref 22–32)
Calcium: 9.2 mg/dL (ref 8.9–10.3)
Chloride: 108 mmol/L (ref 98–111)
Creatinine, Ser: 0.86 mg/dL (ref 0.44–1.00)
GFR calc Af Amer: >60 mL/min (ref >60)
GFR calc non Af Amer: >60 mL/min (ref >60)
Glucose, Bld: 98 mg/dL (ref 70–99)
Potassium: 4.1 mmol/L (ref 3.5–5.1)
Sodium: 143 mmol/L (ref 135–145)
Total Bilirubin: 0.2 mg/dL — ABNORMAL LOW (ref 0.3–1.2)
Total Protein: 6.6 g/dL (ref 6.5–8.1)

## 2019-08-23 NOTE — Telephone Encounter (Signed)
Scheduled appt per 4/15 los.  Spoke with pt and she is aware of the appt date and time.

## 2019-08-24 ENCOUNTER — Telehealth: Payer: Self-pay

## 2019-08-24 ENCOUNTER — Telehealth: Payer: Self-pay | Admitting: Hematology

## 2019-08-24 ENCOUNTER — Other Ambulatory Visit: Payer: Self-pay | Admitting: Hematology

## 2019-08-24 LAB — URINE CULTURE: Culture: >=100000 — AB

## 2019-08-24 MED ORDER — NITROFURANTOIN MONOHYD MACRO 100 MG PO CAPS: 100.0000 mg | ORAL_CAPSULE | Freq: Two times a day (BID) | ORAL | 0 refills | Status: AC

## 2019-08-24 NOTE — Telephone Encounter (Signed)
I called patient, and left her a voice message regarding her urine culture results.  It showed more than 100,000 colonies/ml group B strep.  Patient is allergic to penicillin, cipro and Bactrim, I called in macribid 100mg  bid for 5 days.  Truitt Merle  08/24/2019

## 2019-08-24 NOTE — Telephone Encounter (Signed)
Ms stockwell called requesting lab results

## 2019-12-31 ENCOUNTER — Inpatient Hospital Stay: Payer: Medicare Other | Attending: Nurse Practitioner

## 2019-12-31 ENCOUNTER — Other Ambulatory Visit: Payer: Self-pay

## 2019-12-31 DIAGNOSIS — D509 Iron deficiency anemia, unspecified: Secondary | ICD-10-CM | POA: Insufficient documentation

## 2019-12-31 DIAGNOSIS — M8580 Other specified disorders of bone density and structure, unspecified site: Secondary | ICD-10-CM | POA: Diagnosis not present

## 2019-12-31 DIAGNOSIS — D649 Anemia, unspecified: Secondary | ICD-10-CM

## 2019-12-31 DIAGNOSIS — Z171 Estrogen receptor negative status [ER-]: Secondary | ICD-10-CM

## 2019-12-31 DIAGNOSIS — C50411 Malignant neoplasm of upper-outer quadrant of right female breast: Secondary | ICD-10-CM

## 2019-12-31 DIAGNOSIS — Z853 Personal history of malignant neoplasm of breast: Secondary | ICD-10-CM | POA: Insufficient documentation

## 2019-12-31 LAB — CBC WITH DIFFERENTIAL/PLATELET
Abs Immature Granulocytes: 0.01 10*3/uL (ref 0.00–0.07)
Basophils Absolute: 0.1 10*3/uL (ref 0.0–0.1)
Basophils Relative: 1 %
Eosinophils Absolute: 0.2 10*3/uL (ref 0.0–0.5)
Eosinophils Relative: 6 %
HCT: 34.1 % — ABNORMAL LOW (ref 36.0–46.0)
Hemoglobin: 11.4 g/dL — ABNORMAL LOW (ref 12.0–15.0)
Immature Granulocytes: 0 %
Lymphocytes Relative: 29 %
Lymphs Abs: 1.2 10*3/uL (ref 0.7–4.0)
MCH: 29.3 pg (ref 26.0–34.0)
MCHC: 33.4 g/dL (ref 30.0–36.0)
MCV: 87.7 fL (ref 80.0–100.0)
Monocytes Absolute: 0.4 10*3/uL (ref 0.1–1.0)
Monocytes Relative: 9 %
Neutro Abs: 2.2 10*3/uL (ref 1.7–7.7)
Neutrophils Relative %: 55 %
Platelets: 281 10*3/uL (ref 150–400)
RBC: 3.89 MIL/uL (ref 3.87–5.11)
RDW: 12.1 % (ref 11.5–15.5)
WBC: 4 10*3/uL (ref 4.0–10.5)
nRBC: 0 % (ref 0.0–0.2)

## 2019-12-31 LAB — COMPREHENSIVE METABOLIC PANEL
ALT: 14 U/L (ref 0–44)
AST: 17 U/L (ref 15–41)
Albumin: 3.7 g/dL (ref 3.5–5.0)
Alkaline Phosphatase: 48 U/L (ref 38–126)
Anion gap: 9 (ref 5–15)
BUN: 23 mg/dL (ref 8–23)
CO2: 26 mmol/L (ref 22–32)
Calcium: 10.3 mg/dL (ref 8.9–10.3)
Chloride: 103 mmol/L (ref 98–111)
Creatinine, Ser: 0.86 mg/dL (ref 0.44–1.00)
GFR calc Af Amer: >60 mL/min (ref >60)
GFR calc non Af Amer: >60 mL/min (ref >60)
Glucose, Bld: 104 mg/dL — ABNORMAL HIGH (ref 70–99)
Potassium: 4.5 mmol/L (ref 3.5–5.1)
Sodium: 138 mmol/L (ref 135–145)
Total Bilirubin: 0.3 mg/dL (ref 0.3–1.2)
Total Protein: 6.7 g/dL (ref 6.5–8.1)

## 2019-12-31 LAB — IRON AND TIBC
Iron: 93 ug/dL (ref 41–142)
Saturation Ratios: 19 % — ABNORMAL LOW (ref 21–57)
TIBC: 493 ug/dL — ABNORMAL HIGH (ref 236–444)
UIBC: 400 ug/dL — ABNORMAL HIGH (ref 120–384)

## 2019-12-31 LAB — FERRITIN: Ferritin: 177 ng/mL (ref 11–307)

## 2020-01-03 ENCOUNTER — Telehealth: Payer: Self-pay

## 2020-01-03 NOTE — Telephone Encounter (Signed)
Ms Maciver called asking about her lab results.

## 2020-01-04 NOTE — Telephone Encounter (Signed)
I reviewed labs with Dr. Burr Medico. She recommended that Ms Larrivee; increase her iron tablets to twice daily.  I spoke with Ms Walls and relayed Dr. Ernestina Penna recommendations.  She verbalized understanding.

## 2020-01-31 ENCOUNTER — Telehealth: Payer: Self-pay

## 2020-01-31 NOTE — Telephone Encounter (Signed)
I spoke with Dawn West and let her know Dr Burr Medico would like her to have labs drawn the end of November.  She verbalized understanding.  Scheduling message sent.

## 2020-02-01 ENCOUNTER — Telehealth: Payer: Self-pay | Admitting: Hematology

## 2020-02-01 NOTE — Telephone Encounter (Signed)
Scheduled appt per 9/23 sch msg - no answer. Left message for patient with appt date and time , and to call back if the date and time does not work

## 2020-04-07 ENCOUNTER — Inpatient Hospital Stay: Payer: Medicare Other | Attending: Hematology

## 2020-04-07 ENCOUNTER — Other Ambulatory Visit: Payer: Self-pay

## 2020-04-07 DIAGNOSIS — M8580 Other specified disorders of bone density and structure, unspecified site: Secondary | ICD-10-CM | POA: Diagnosis not present

## 2020-04-07 DIAGNOSIS — R300 Dysuria: Secondary | ICD-10-CM | POA: Diagnosis not present

## 2020-04-07 DIAGNOSIS — Z79899 Other long term (current) drug therapy: Secondary | ICD-10-CM | POA: Insufficient documentation

## 2020-04-07 DIAGNOSIS — D649 Anemia, unspecified: Secondary | ICD-10-CM

## 2020-04-07 DIAGNOSIS — Z171 Estrogen receptor negative status [ER-]: Secondary | ICD-10-CM

## 2020-04-07 DIAGNOSIS — Z853 Personal history of malignant neoplasm of breast: Secondary | ICD-10-CM | POA: Diagnosis not present

## 2020-04-07 DIAGNOSIS — D509 Iron deficiency anemia, unspecified: Secondary | ICD-10-CM | POA: Diagnosis not present

## 2020-04-07 DIAGNOSIS — D75839 Thrombocytosis, unspecified: Secondary | ICD-10-CM | POA: Insufficient documentation

## 2020-04-07 LAB — COMPREHENSIVE METABOLIC PANEL
ALT: 26 U/L (ref 0–44)
AST: 25 U/L (ref 15–41)
Albumin: 4.3 g/dL (ref 3.5–5.0)
Alkaline Phosphatase: 53 U/L (ref 38–126)
Anion gap: 10 (ref 5–15)
BUN: 17 mg/dL (ref 8–23)
CO2: 28 mmol/L (ref 22–32)
Calcium: 10.5 mg/dL — ABNORMAL HIGH (ref 8.9–10.3)
Chloride: 101 mmol/L (ref 98–111)
Creatinine, Ser: 0.91 mg/dL (ref 0.44–1.00)
GFR, Estimated: >60 mL/min (ref >60)
Glucose, Bld: 101 mg/dL — ABNORMAL HIGH (ref 70–99)
Potassium: 4.2 mmol/L (ref 3.5–5.1)
Sodium: 139 mmol/L (ref 135–145)
Total Bilirubin: 0.4 mg/dL (ref 0.3–1.2)
Total Protein: 7.4 g/dL (ref 6.5–8.1)

## 2020-04-07 LAB — CBC WITH DIFFERENTIAL/PLATELET
Abs Immature Granulocytes: 0.05 10*3/uL (ref 0.00–0.07)
Basophils Absolute: 0.1 10*3/uL (ref 0.0–0.1)
Basophils Relative: 1 %
Eosinophils Absolute: 0.2 10*3/uL (ref 0.0–0.5)
Eosinophils Relative: 2 %
HCT: 36.1 % (ref 36.0–46.0)
Hemoglobin: 11.9 g/dL — ABNORMAL LOW (ref 12.0–15.0)
Immature Granulocytes: 1 %
Lymphocytes Relative: 7 %
Lymphs Abs: 0.7 10*3/uL (ref 0.7–4.0)
MCH: 29.6 pg (ref 26.0–34.0)
MCHC: 33 g/dL (ref 30.0–36.0)
MCV: 89.8 fL (ref 80.0–100.0)
Monocytes Absolute: 0.7 10*3/uL (ref 0.1–1.0)
Monocytes Relative: 6 %
Neutro Abs: 9.3 10*3/uL — ABNORMAL HIGH (ref 1.7–7.7)
Neutrophils Relative %: 83 %
Platelets: 285 10*3/uL (ref 150–400)
RBC: 4.02 MIL/uL (ref 3.87–5.11)
RDW: 12.3 % (ref 11.5–15.5)
WBC: 11 10*3/uL — ABNORMAL HIGH (ref 4.0–10.5)
nRBC: 0 % (ref 0.0–0.2)

## 2020-04-07 LAB — IRON AND TIBC
Iron: 38 ug/dL — ABNORMAL LOW (ref 41–142)
Saturation Ratios: 6 % — ABNORMAL LOW (ref 21–57)
TIBC: 595 ug/dL — ABNORMAL HIGH (ref 236–444)
UIBC: 557 ug/dL — ABNORMAL HIGH (ref 120–384)

## 2020-04-07 LAB — FERRITIN: Ferritin: 173 ng/mL (ref 11–307)

## 2020-04-17 ENCOUNTER — Telehealth: Payer: Self-pay

## 2020-04-17 NOTE — Telephone Encounter (Signed)
Pt called wanting anemia lab results.  Per Dr Burr Medico they are stable.  Dawn West is to continue her current dose of oral iron.  She is taking 1 tablet twice daily.

## 2020-05-16 DIAGNOSIS — R1901 Right upper quadrant abdominal swelling, mass and lump: Secondary | ICD-10-CM | POA: Diagnosis not present

## 2020-05-16 DIAGNOSIS — R1011 Right upper quadrant pain: Secondary | ICD-10-CM | POA: Diagnosis not present

## 2020-05-16 DIAGNOSIS — G8929 Other chronic pain: Secondary | ICD-10-CM | POA: Diagnosis not present

## 2020-05-21 ENCOUNTER — Other Ambulatory Visit: Payer: Self-pay | Admitting: Surgery

## 2020-05-21 DIAGNOSIS — R1901 Right upper quadrant abdominal swelling, mass and lump: Secondary | ICD-10-CM

## 2020-06-02 DIAGNOSIS — K3189 Other diseases of stomach and duodenum: Secondary | ICD-10-CM | POA: Diagnosis not present

## 2020-06-02 DIAGNOSIS — K319 Disease of stomach and duodenum, unspecified: Secondary | ICD-10-CM | POA: Diagnosis not present

## 2020-06-02 DIAGNOSIS — K2970 Gastritis, unspecified, without bleeding: Secondary | ICD-10-CM | POA: Diagnosis not present

## 2020-06-02 DIAGNOSIS — D509 Iron deficiency anemia, unspecified: Secondary | ICD-10-CM | POA: Diagnosis not present

## 2020-06-02 DIAGNOSIS — R1013 Epigastric pain: Secondary | ICD-10-CM | POA: Diagnosis not present

## 2020-06-05 ENCOUNTER — Ambulatory Visit
Admission: RE | Admit: 2020-06-05 | Discharge: 2020-06-05 | Disposition: A | Discharge status: Home / Self Care | Payer: Medicare Other | Source: Ambulatory Visit | Attending: Surgery | Admitting: Surgery

## 2020-06-05 ENCOUNTER — Other Ambulatory Visit: Payer: Self-pay

## 2020-06-05 DIAGNOSIS — R1011 Right upper quadrant pain: Secondary | ICD-10-CM | POA: Diagnosis not present

## 2020-06-05 DIAGNOSIS — Z853 Personal history of malignant neoplasm of breast: Secondary | ICD-10-CM | POA: Diagnosis not present

## 2020-06-05 DIAGNOSIS — R110 Nausea: Secondary | ICD-10-CM | POA: Diagnosis not present

## 2020-06-05 DIAGNOSIS — R1901 Right upper quadrant abdominal swelling, mass and lump: Secondary | ICD-10-CM

## 2020-06-05 DIAGNOSIS — R5383 Other fatigue: Secondary | ICD-10-CM | POA: Diagnosis not present

## 2020-06-05 MED ORDER — IOPAMIDOL (ISOVUE-300) INJECTION 61%
100.0000 mL | Freq: Once | INTRAVENOUS | Status: AC | PRN
  Administered 2020-06-05: 100 mL via INTRAVENOUS

## 2020-06-13 DIAGNOSIS — R030 Elevated blood-pressure reading, without diagnosis of hypertension: Secondary | ICD-10-CM | POA: Diagnosis not present

## 2020-06-13 DIAGNOSIS — M1990 Unspecified osteoarthritis, unspecified site: Secondary | ICD-10-CM | POA: Diagnosis not present

## 2020-06-17 ENCOUNTER — Other Ambulatory Visit: Payer: Self-pay | Admitting: Surgery

## 2020-06-18 ENCOUNTER — Other Ambulatory Visit: Payer: Self-pay | Admitting: Surgery

## 2020-06-18 DIAGNOSIS — E278 Other specified disorders of adrenal gland: Secondary | ICD-10-CM

## 2020-07-02 ENCOUNTER — Telehealth: Payer: Self-pay | Admitting: Nurse Practitioner

## 2020-07-02 NOTE — Telephone Encounter (Signed)
Contacted patient about moved appointment times per provider template. Patient is aware.

## 2020-07-04 DIAGNOSIS — Z23 Encounter for immunization: Secondary | ICD-10-CM | POA: Diagnosis not present

## 2020-07-05 ENCOUNTER — Other Ambulatory Visit: Payer: Medicare Other

## 2020-07-08 ENCOUNTER — Other Ambulatory Visit: Payer: Self-pay

## 2020-07-08 ENCOUNTER — Ambulatory Visit
Admission: RE | Admit: 2020-07-08 | Discharge: 2020-07-08 | Disposition: A | Discharge status: Home / Self Care | Payer: Medicare Other | Source: Ambulatory Visit | Attending: Surgery | Admitting: Surgery

## 2020-07-08 DIAGNOSIS — E278 Other specified disorders of adrenal gland: Secondary | ICD-10-CM

## 2020-07-08 MED ORDER — IOPAMIDOL (ISOVUE-300) INJECTION 61%
100.0000 mL | Freq: Once | INTRAVENOUS | Status: AC | PRN
  Administered 2020-07-08: 100 mL via INTRAVENOUS

## 2020-07-10 ENCOUNTER — Other Ambulatory Visit: Payer: Medicare Other

## 2020-07-10 DIAGNOSIS — F419 Anxiety disorder, unspecified: Secondary | ICD-10-CM | POA: Diagnosis not present

## 2020-07-10 DIAGNOSIS — E785 Hyperlipidemia, unspecified: Secondary | ICD-10-CM | POA: Diagnosis not present

## 2020-07-10 DIAGNOSIS — M1990 Unspecified osteoarthritis, unspecified site: Secondary | ICD-10-CM | POA: Diagnosis not present

## 2020-07-10 DIAGNOSIS — Z0000 Encounter for general adult medical examination without abnormal findings: Secondary | ICD-10-CM | POA: Diagnosis not present

## 2020-07-10 DIAGNOSIS — R300 Dysuria: Secondary | ICD-10-CM | POA: Diagnosis not present

## 2020-07-10 DIAGNOSIS — R519 Headache, unspecified: Secondary | ICD-10-CM | POA: Diagnosis not present

## 2020-07-10 DIAGNOSIS — I10 Essential (primary) hypertension: Secondary | ICD-10-CM | POA: Diagnosis not present

## 2020-07-10 DIAGNOSIS — C50411 Malignant neoplasm of upper-outer quadrant of right female breast: Secondary | ICD-10-CM | POA: Diagnosis not present

## 2020-07-31 ENCOUNTER — Inpatient Hospital Stay: Payer: Medicare Other | Attending: Nurse Practitioner

## 2020-07-31 ENCOUNTER — Inpatient Hospital Stay (HOSPITAL_BASED_OUTPATIENT_CLINIC_OR_DEPARTMENT_OTHER): Payer: Medicare Other | Admitting: Nurse Practitioner

## 2020-07-31 ENCOUNTER — Other Ambulatory Visit: Payer: Self-pay

## 2020-07-31 ENCOUNTER — Encounter: Payer: Self-pay | Admitting: Nurse Practitioner

## 2020-07-31 VITALS — BP 135/60 | HR 76 | Temp 98.1°F | Resp 18 | Ht 62.0 in | Wt 137.0 lb

## 2020-07-31 DIAGNOSIS — Z79899 Other long term (current) drug therapy: Secondary | ICD-10-CM | POA: Diagnosis not present

## 2020-07-31 DIAGNOSIS — D509 Iron deficiency anemia, unspecified: Secondary | ICD-10-CM | POA: Insufficient documentation

## 2020-07-31 DIAGNOSIS — D75839 Thrombocytosis, unspecified: Secondary | ICD-10-CM | POA: Insufficient documentation

## 2020-07-31 DIAGNOSIS — Z171 Estrogen receptor negative status [ER-]: Secondary | ICD-10-CM

## 2020-07-31 DIAGNOSIS — Z96653 Presence of artificial knee joint, bilateral: Secondary | ICD-10-CM | POA: Insufficient documentation

## 2020-07-31 DIAGNOSIS — C50411 Malignant neoplasm of upper-outer quadrant of right female breast: Secondary | ICD-10-CM | POA: Diagnosis not present

## 2020-07-31 DIAGNOSIS — Z9049 Acquired absence of other specified parts of digestive tract: Secondary | ICD-10-CM | POA: Diagnosis not present

## 2020-07-31 DIAGNOSIS — M85852 Other specified disorders of bone density and structure, left thigh: Secondary | ICD-10-CM | POA: Diagnosis not present

## 2020-07-31 DIAGNOSIS — Z853 Personal history of malignant neoplasm of breast: Secondary | ICD-10-CM | POA: Diagnosis not present

## 2020-07-31 DIAGNOSIS — D649 Anemia, unspecified: Secondary | ICD-10-CM

## 2020-07-31 LAB — COMPREHENSIVE METABOLIC PANEL
ALT: 19 U/L (ref 0–44)
AST: 19 U/L (ref 15–41)
Albumin: 3.9 g/dL (ref 3.5–5.0)
Alkaline Phosphatase: 42 U/L (ref 38–126)
Anion gap: 9 (ref 5–15)
BUN: 20 mg/dL (ref 8–23)
CO2: 28 mmol/L (ref 22–32)
Calcium: 9.2 mg/dL (ref 8.9–10.3)
Chloride: 103 mmol/L (ref 98–111)
Creatinine, Ser: 0.86 mg/dL (ref 0.44–1.00)
GFR, Estimated: >60 mL/min (ref >60)
Glucose, Bld: 81 mg/dL (ref 70–99)
Potassium: 4.2 mmol/L (ref 3.5–5.1)
Sodium: 140 mmol/L (ref 135–145)
Total Bilirubin: 0.3 mg/dL (ref 0.3–1.2)
Total Protein: 6.8 g/dL (ref 6.5–8.1)

## 2020-07-31 LAB — CBC WITH DIFFERENTIAL/PLATELET
Abs Immature Granulocytes: 0.01 10*3/uL (ref 0.00–0.07)
Basophils Absolute: 0 10*3/uL (ref 0.0–0.1)
Basophils Relative: 1 %
Eosinophils Absolute: 0.1 10*3/uL (ref 0.0–0.5)
Eosinophils Relative: 4 %
HCT: 33.9 % — ABNORMAL LOW (ref 36.0–46.0)
Hemoglobin: 11.5 g/dL — ABNORMAL LOW (ref 12.0–15.0)
Immature Granulocytes: 0 %
Lymphocytes Relative: 36 %
Lymphs Abs: 1 10*3/uL (ref 0.7–4.0)
MCH: 29.5 pg (ref 26.0–34.0)
MCHC: 33.9 g/dL (ref 30.0–36.0)
MCV: 86.9 fL (ref 80.0–100.0)
Monocytes Absolute: 0.3 10*3/uL (ref 0.1–1.0)
Monocytes Relative: 9 %
Neutro Abs: 1.4 10*3/uL — ABNORMAL LOW (ref 1.7–7.7)
Neutrophils Relative %: 50 %
Platelets: 270 10*3/uL (ref 150–400)
RBC: 3.9 MIL/uL (ref 3.87–5.11)
RDW: 12.7 % (ref 11.5–15.5)
WBC: 2.9 10*3/uL — ABNORMAL LOW (ref 4.0–10.5)
nRBC: 0 % (ref 0.0–0.2)

## 2020-07-31 LAB — FERRITIN: Ferritin: 148 ng/mL (ref 11–307)

## 2020-07-31 LAB — IRON AND TIBC
Iron: 85 ug/dL (ref 41–142)
Saturation Ratios: 17 % — ABNORMAL LOW (ref 21–57)
TIBC: 508 ug/dL — ABNORMAL HIGH (ref 236–444)
UIBC: 422 ug/dL — ABNORMAL HIGH (ref 120–384)

## 2020-07-31 NOTE — Progress Notes (Signed)
[facility]   [phone] [phone]) [phone]   Clinic Follow up Note   Patient Care Team: Aurea Graff, PA-C (Inactive) as PCP - General (Physician Assistant) Kem Boroughs, [place] as Nurse Practitioner (Family Medicine) [name], MD as Consulting Physician (General Surgery) Truitt Merle, MD as Consulting Physician (Hematology) Gery Pray, MD as Consulting Physician (Radiation Oncology) 07/31/2020  CHIEF COMPLAINT: Follow-up right breast cancer and anemia  SUMMARY OF ONCOLOGIC HISTORY: Oncology History Overview Note  Cancer Staging Breast cancer of upper-outer quadrant of right female breast [facility]) Staging form: Breast, AJCC 8th Edition - Clinical: Stage 0 (cTis (DCIS), cN0, cM0, ER: Negative, PR: Negative) - Signed by Truitt Merle, MD on 05/25/2016 - Pathologic stage from 06/02/2016: Stage IA (pT1a, pN0, cM0, G2, ER: Negative, PR: Negative, HER2: Positive) - Signed by Truitt Merle, MD on 11/29/2016     Breast cancer of upper-outer quadrant of right female breast ([place])  05/18/2016 Mammogram   Diagnostic mammogram of the right breast showed heterogeneous calcifications in a linear orientation within the posterior upper right breast and spanning a distance of 2.2 cm. No associated mass identified.   05/19/2016 Initial Biopsy   Right breast core needle biopsy showed ductal carcinoma with calcification, high-grade, focal highly suspicious for stromal invasion.   05/19/2016 Receptors her2   ER negative, PR negative   05/19/2016 Initial Diagnosis   Ductal carcinoma in situ (DCIS) of right breast   06/02/2016 Surgery   RIGHT BREAST LUMPECTOMY WITH RADIOACTIVE SEED AND SENTINEL LYMPH NODE BIOPSY by Dr. Barry Dienes   06/02/2016 Pathology Results   Diagnosis 06/02/16 1. Breast, lumpectomy, Right w/seed - INVASIVE DUCTAL CARCINOMA, GRADE 2, SPANNING 0.3 CM. - HIGH GRADE DUCTAL CARCINOMA IN SITU WITH NECROSIS. - INVASIVE CARCINOMA COMES TO WITHIN <0.1 CM OF THE  POSTERIOR MARGIN FOCALLY. - DUCTAL CARCINOMA IN SITU COMES TO WITHIN <0.1 CM OF THE POSTERIOR MARGIN FOCALLY AND THE SUPERIOR MARGIN FOCALLY. - BIOPSY SITE. - SEE ONCOLOGY TABLE. 2. Breast, excision, Right additional Medial Margin - FIBROCYSTIC CHANGE. - USUAL DUCTAL HYPERPLASIA. - NO MALIGNANCY IDENTIFIED. 3. Lymph node, sentinel, biopsy, Right Axillary #1 - ONE OF ONE LYMPH NODES NEGATIVE FOR CARCINOMA (0/1). 4. Lymph node, sentinel, biopsy, Right Axillary - ONE OF ONE LYMPH NODES NEGATIVE FOR CARCINOMA (0/1).   06/02/2016 Receptors her2   ER-, PR-, HER2+    07/26/2016 Surgery   RE-EXCISION OF RIGHT BREAST LUMPECTOMY by Dr. Barry Dienes   07/26/2016 Pathology Results   Diagnosis 07/26/16 Breast, excision, Right new Superior Margin - DUCTAL CARCINOMA IN SITU WITH CALCIFICATIONS, HIGH GRADE, SPANNING AT LEAST 1.5 CM. - FOCI HIGHLY SUSPICIOUS FOR EARLY STROMAL INVASION. - DUCTAL CARCINOMA IN SITU IS BROADLY LESS THAN 0.1 CM TO THE NEW SUPERIOR MARGIN. - SEE COMMENT.   08/26/2016 Pathology Results   Diagnosis 08/26/16 Breast, right, needle core biopsy, superior anterior - HIGH GRADE, DUCTAL CARCINOMA IN SITU - SEE COMMENT   09/06/2016 Surgery   RE-EXCISION OF RIGHT BREAST LUMPECTOMY WITH RADIOACTIVE SEED LOCALIZATION by Dr. Barry Dienes   09/06/2016 Pathology Results   Diagnosis 09/06/16 1. Breast, excision, Right Superior Margin - INVASIVE DUCTAL CARCINOMA, GRADE III/III, SCATTERED MICROSCOPIC FOCI. - DUCTAL CARCINOMA IN SITU WITH CALCIFICATIONS, HIGH GRADE. - INVASIVE CARCINOMA IS FOCALLY LESS THAN 0.1 CM TO THE INFERIOR MARGIN OF SPECIMEN #1.. - DUCTAL CARCINOMA IN SITU IS BROADLY LESS THAN 0.1 CM TO THE LATERAL MARGIN, FOCALLY LESS THAN 0.1 CM TO THE INFERIOR MARGIN, AND FOCALLY LESS THAN 0.1 CM TO THE ANTERIOR MARGIN OF  SPECIMEN #1. - SEE COMMENT. 2. Breast, excision, Right Superolateral Margin - FIBROCYSTIC CHANGES. - THERE IS NO EVIDENCE OF MALIGNANCY. - SEE COMMENT.   10/13/2016 -  11/30/2016 Radiation Therapy   Radiation by Dr. Sondra Come  Radiation treatment dates:   10/13/16 - 11/30/16 Site/dose:    1) Right Breast treated to 50.4 Gy in 28 fractions 2) Right Breast Boosted an additional 12 Gy in 6 fractions             Beams/energy:    1) 3D  //  6X 2) 3D  //  6X Narrative: The patient tolerated radiation treatment relatively well. The patient experienced occasional mild pain in her right breast. She also reported some fatigue and appetite reduction throughout treatment, though this did not affect her activities of daily living. She experienced some radiation related skin changes including moderate erythema across the right breast.    05/31/2017 Mammogram   Diagnostic Mammogram 05/31/17 IMPRESSION: No mammographic evidence of malignancy. RECOMMENDATION: Annual diagnostic mammography.     CURRENT THERAPY:  1.  Surveillance for history of right breast cancer 2.  Oral iron 1 tab twice daily for IDA  INTERVAL HISTORY: [name] returns for follow-up as scheduled.  She was last seen in 08/2019.  Labs in 03/2020 showed stable IDA.  She continues oral iron twice daily.  Her mammogram is scheduled on 08/15/2020.  She has been under a lot of stress this past year, her husband died in 11-Dec-2019 and she feels she has had multiple problems after that.  She also just learned her daughter and grandchildren are moving to [place].  Grandson brought home a GI bug last week, the patient had several days of vomiting and diarrhea, and a UTI earlier in March before that.  She has recovered.  She denies new lump/mass in her breast, nipple discharge or inversion, or skin change.  She continues to have arthritis in her hands, no new bone or joint pain.  She developed a gastric ulcer per Dr. Collene Mares and is off diclofenac now.  She is also been under the care of surgeon Dr. Ninfa Linden for cholecystectomy.  Work-up showed stable renal and liver lesions. All other systems were reviewed with the patient and are  negative.   MEDICAL HISTORY:  Past Medical History:  Diagnosis Date  . Anxiety   . Arthritis    knees  . Breast cancer ([place]) 2018   Right Breast  . Cancer ([place]) 12/01/11   basal cell skin ca Left upper arm  . Cancer ([place]) 04/20/2016   right breast cancer  . Complication of anesthesia   . Depression   . Headache   . History of radiation therapy 10/13/16-11/30/16   right breast 50.4 Gy in 28 fractions, right breast boost 12 Gy in 6 fractions  . Hypercholesteremia   . Personal history of radiation therapy 2018   Right Breast Cancer  . PONV (postoperative nausea and vomiting)     SURGICAL HISTORY: Past Surgical History:  Procedure Laterality Date  . ABDOMINAL HYSTERECTOMY    . BREAST LUMPECTOMY Right 2018  . BREAST LUMPECTOMY WITH RADIOACTIVE SEED AND SENTINEL LYMPH NODE BIOPSY Right 06/02/2016   Procedure: RIGHT BREAST LUMPECTOMY WITH RADIOACTIVE SEED AND SENTINEL LYMPH NODE BIOPSY;  Surgeon: [name], MD;  Location: [place];  Service: General;  Laterality: Right;  . BREAST LUMPECTOMY WITH RADIOACTIVE SEED LOCALIZATION Right 09/06/2016   Procedure: RE-EXCISION OF RIGHT BREAST LUMPECTOMY WITH RADIOACTIVE SEED LOCALIZATION;  Surgeon: [name], MD;  Location: [place];  Service: General;  Laterality: Right;  . BREAST SURGERY  05/19/2016   right breast biopsy  . CHOLECYSTECTOMY N/A 06/13/2019   Procedure: LAPAROSCOPIC CHOLECYSTECTOMY;  Surgeon: Coralie Keens, MD;  Location: [place];  Service: General;  Laterality: N/A;  . KNEE ARTHROPLASTY Left 06/10/2016   Procedure: LEFT TOTAL KNEE ARTHROPLASTY WITH COMPUTER NAVIGATION;  Surgeon: Rod Can, MD;  Location: [facility];  Service: Orthopedics;  Laterality: Left;  . KNEE ARTHROPLASTY Right 01/20/2017   Procedure: RIGHT TOTAL KNEE ARTHROPLASTY WITH COMPUTER NAVIGATION;  Surgeon: Rod Can, MD;  Location: [facility];  Service: Orthopedics;  Laterality: Right;  Adductor Block  .  MOUTH SURGERY    . RE-EXCISION OF BREAST LUMPECTOMY Right 07/26/2016   Procedure: RE-EXCISION OF RIGHT BREAST LUMPECTOMY;  Surgeon: [name], MD;  Location: [place];  Service: General;  Laterality: Right;  . SKIN CANCER EXCISION Left 2013   upper arm, basal cell  . TONSILLECTOMY AND ADENOIDECTOMY     (DEVIATED SEPTUM  SAME TIME)  . TOTAL VAGINAL HYSTERECTOMY  12/1996   menorrhagia, fibroids    I have reviewed the social history and family history with the patient and they are unchanged from previous note.  ALLERGIES:  is allergic to tramadol, allegra [fexofenadine hcl], ciprofloxacin, estradiol, oxycodone, penicillins, and sulfa antibiotics.  MEDICATIONS:  Current Outpatient Medications  Medication Sig Dispense Refill  . acetaminophen (TYLENOL) 500 MG tablet Take 500 mg by mouth every 6 (six) hours as needed.    . Calcium Carb-Cholecalciferol (CALCIUM 600 + D) 600-200 MG-UNIT TABS Take 1 tablet by mouth 2 (two) times daily.     . cetirizine (ZYRTEC) 10 MG tablet Take 10 mg by mouth at bedtime. At 10 pm daily    . diclofenac (VOLTAREN) 75 MG EC tablet Take 75 mg by mouth 2 (two) times daily.    Marland Kitchen docusate sodium (COLACE) 100 MG capsule Take 1 capsule (100 mg total) by mouth 2 (two) times daily. 60 capsule 1  . fenofibrate micronized (LOFIBRA) 134 MG capsule Take 134 mg by mouth daily before breakfast.    . fluticasone (FLONASE) 50 MCG/ACT nasal spray Place 1 spray into both nostrils daily.     . Glucos-Chond-Hyal Ac-Ca Fructo (MOVE FREE JOINT HEALTH ADVANCE PO) Take 1 tablet by mouth daily.    . Magnesium 250 MG TABS Take 250 mg by mouth 3 (three) times daily.     . Melatonin 10 MG CAPS Take by mouth.    . Multiple Vitamin (MULTIVITAMIN) tablet Take 1 tablet by mouth daily.    . nitrofurantoin, macrocrystal-monohydrate, (MACROBID) 100 MG capsule Take 1 capsule (100 mg total) by mouth 2 (two) times daily. 10 capsule 0  . Omega-3 Fatty Acids (FISH OIL) 1000 MG CAPS Take 1,000 mg by mouth 3  (three) times daily. Takes 1-3 times daily    . polyethylene glycol (MIRALAX / GLYCOLAX) 17 g packet Take 17 g by mouth daily as needed.    . Probiotic Product (PROBIOTIC DAILY PO) Take by mouth.    . sertraline (ZOLOFT) 100 MG tablet Take 1 tablet (100 mg total) by mouth daily. (Patient taking differently: Take 100 mg by mouth daily. ) 90 tablet 4  . sodium chloride (OCEAN) 0.65 % SOLN nasal spray Place 1 spray into both nostrils 2 (two) times daily as needed for congestion.    Marland Kitchen tetrahydrozoline 0.05 % ophthalmic solution Place 1 drop into both eyes daily as needed (dry eyes).    Marland Kitchen  traZODone (DESYREL) 100 MG tablet Take 100 mg by mouth at bedtime.    . Vitamin D, Ergocalciferol, (DRISDOL) 50000 units CAPS capsule Take 1 capsule (50,000 Units total) by mouth See admin instructions. Takes every other week on Friday 30 capsule 2  . vitamin E 400 UNIT capsule Take 400 Units by mouth 3 (three) times daily.     Marland Kitchen zinc gluconate 50 MG tablet Take 50 mg by mouth daily.     No current facility-administered medications for this visit.    PHYSICAL EXAMINATION: ECOG PERFORMANCE STATUS: 0 - Asymptomatic  Vitals:   07/31/20 0923  BP: 135/60  Pulse: 76  Resp: 18  Temp: 98.1 F (36.7 C)  SpO2: 98%   Filed Weights   07/31/20 0923  Weight: 137 lb (62.1 kg)    GENERAL:alert, no distress and comfortable SKIN: No rash EYES: sclera clear LYMPH:  no palpable cervical or supraclavicular lymphadenopathy  LUNGS:  with normal breathing effort HEART:  no lower extremity edema ABDOMEN:abdomen soft, non-tender and normal bowel sounds.  No hepatomegaly or palpable mass NEURO: alert & oriented x 3 with fluent speech, no focal motor/sensory deficits Breast exam: Without nipple discharge or inversion.  S/p right lumpectomy, incisions completely healed.  There is mild retraction around the areola.  No palpable mass in either breast or axilla that I could appreciate  LABORATORY DATA:  I have reviewed the  data as listed CBC Latest Ref Rng & Units 07/31/2020 04/07/2020 12/31/2019  WBC 4.0 - 10.5 K/uL 2.9(L) 11.0(H) 4.0  Hemoglobin 12.0 - 15.0 g/dL 11.5(L) 11.9(L) 11.4(L)  Hematocrit 36.0 - 46.0 % 33.9(L) 36.1 34.1(L)  Platelets 150 - 400 K/uL 270 285 281     CMP Latest Ref Rng & Units 07/31/2020 04/07/2020 12/31/2019  Glucose 70 - 99 mg/dL 81 101(H) 104(H)  BUN 8 - 23 mg/dL $Remove'20 17 23  'biiiuKF$ Creatinine 0.44 - 1.00 mg/dL 0.86 0.91 0.86  Sodium 135 - 145 mmol/L 140 139 138  Potassium 3.5 - 5.1 mmol/L 4.2 4.2 4.5  Chloride 98 - 111 mmol/L 103 101 103  CO2 22 - 32 mmol/L $RemoveB'28 28 26  'EWiwYFws$ Calcium 8.9 - 10.3 mg/dL 9.2 10.5(H) 10.3  Total Protein 6.5 - 8.1 g/dL 6.8 7.4 6.7  Total Bilirubin 0.3 - 1.2 mg/dL 0.3 0.4 0.3  Alkaline Phos 38 - 126 U/L 42 53 48  AST 15 - 41 U/L $Remo'19 25 17  'oXRKF$ ALT 0 - 44 U/L $Remo'19 26 14      'nxtXR$ RADIOGRAPHIC STUDIES: I have personally reviewed the radiological images as listed and agreed with the findings in the report. No results found.   ASSESSMENT & PLAN: Rhona Fusilier is a 70 y.o. female with    1. Breast cancer of upper outer quadrant of right breast, invasive ductal carcinoma, pT1aN0M0, stage IA, ER-/PR-/HER2+, and high grade DCIS ER-/PR- -Diagnosed in 05/2016. S/p right breast lumpectomy and 2 breast re-excision surgeries and adjuvant RT. -she began close surveillance given ER/PR negative and HER-2 positive disease is more aggressive -Mammogram 06/06/2019 negative - continue breast cancer surveillance  2.Iron deficientAnemia with thrombocytosis -Based on Epicrecords,she has been mildly anemia since early 2018.  -Per pt she had a colonoscopy in 2019 which wasunremarkable. Anemia workup showed iron deficiency and otherwise negative. -She startedOTC oral ferrous sulfate 1-2 times dailyin summer 2019. Anemia and thrombocytosis resolved on 02/2018 labs.  -She developed recurrent IDA, she is followed by Dr. Collene Mares for reported upper GI ulcer -IDA stable on oral iron 1  tab  twice daily, continue  3.Osteopenia -Her1/2020 DEXA shows osteopenia with lowest T-score of -1.6 at left hip. -Continue Calcium andVitD and remain active with weight bearing exercise. Given her 2 knee replacement her orthopedist did not recommend walking.  -Next DEXA in April with mammogram  4. S/p cholecystectomy -Due to abdominal pain which started Thanksgiving she had work up and ultimately underwent cholecystectomy on 06/13/19.  -She had inflammation of intestines post surgery and developed RUQ "bulge" -CT 05/2020 per Dr. Ninfa Linden showed right adrenal nodule and calcifications in the liver favored to represent dropped gall stones but are indeterminate. Stable on repeat CT 07/2020   -images were reviewed with Dr. Burr Medico, these will likely warrant surveillance.  I will request Dr. Trevor Mace note. If he does not plan to follow, I will likely get CT in 6-12 months.     Disposition: Ms. Gent is clinically doing well from a breast cancer standpoint.  Exam is benign, CMP completely normal.  CBC consistent with mild IDA and neutropenia likely secondary to recent viral infection.  No clinical concern for breast cancer recurrence.   Mammogram scheduled 08/15/20. She is 4 years from diagnosis, recurrence risk is lower but ER/PR - HER2 + breast cancer are more aggressive in general. Continue surveillance.   All questions were answered. The patient knows to call the clinic with any problems, questions or concerns. No barriers to learning were detected. Total encounter time was 30 minutes.      Alla Feeling, NP 07/31/20

## 2020-08-04 ENCOUNTER — Telehealth: Payer: Self-pay | Admitting: Nurse Practitioner

## 2020-08-04 NOTE — Telephone Encounter (Signed)
Scheduled follow-up appointment per 3/24 los. Patient is aware. 

## 2020-08-21 ENCOUNTER — Ambulatory Visit: Payer: Medicare Other | Admitting: Nurse Practitioner

## 2020-08-21 ENCOUNTER — Other Ambulatory Visit: Payer: Medicare Other

## 2020-09-30 ENCOUNTER — Other Ambulatory Visit: Payer: Self-pay | Admitting: Hematology

## 2020-09-30 DIAGNOSIS — E2839 Other primary ovarian failure: Secondary | ICD-10-CM

## 2020-09-30 DIAGNOSIS — Z853 Personal history of malignant neoplasm of breast: Secondary | ICD-10-CM

## 2020-10-02 ENCOUNTER — Other Ambulatory Visit: Payer: Self-pay | Admitting: Nurse Practitioner

## 2020-10-02 DIAGNOSIS — Z853 Personal history of malignant neoplasm of breast: Secondary | ICD-10-CM

## 2020-10-02 DIAGNOSIS — E2839 Other primary ovarian failure: Secondary | ICD-10-CM

## 2020-10-03 ENCOUNTER — Other Ambulatory Visit: Payer: Self-pay

## 2020-10-03 ENCOUNTER — Other Ambulatory Visit: Payer: Self-pay | Admitting: Nurse Practitioner

## 2020-10-03 ENCOUNTER — Ambulatory Visit
Admission: RE | Admit: 2020-10-03 | Discharge: 2020-10-03 | Disposition: A | Discharge status: Home / Self Care | Payer: Medicare Other | Source: Ambulatory Visit | Attending: Hematology | Admitting: Hematology

## 2020-10-03 DIAGNOSIS — Z853 Personal history of malignant neoplasm of breast: Secondary | ICD-10-CM

## 2020-10-03 DIAGNOSIS — Z1231 Encounter for screening mammogram for malignant neoplasm of breast: Secondary | ICD-10-CM | POA: Diagnosis not present

## 2020-10-10 DIAGNOSIS — I10 Essential (primary) hypertension: Secondary | ICD-10-CM | POA: Diagnosis not present

## 2020-10-24 ENCOUNTER — Telehealth: Payer: Self-pay

## 2020-10-24 NOTE — Telephone Encounter (Signed)
-----   Message from Alla Feeling, NP sent at 10/23/2020  2:52 PM EDT ----- Sorry for the delay. I tried to reach her cell and home, no answers.   My nurse, could you please try Friday or Monday to call her. If she is taking oral iron BID, encourage her to increase to TID or at least 3 tabs daily on MWF.   Thanks, Regan Rakers  ----- Message ----- From: Truitt Merle, MD Sent: 08/04/2020   7:43 AM EDT To: Alla Feeling, NP  Lacie, you saw her last week, given her elevated TIBC and mild anemia, I would suggest her to increase oral iron, let her know if you agree, thanks   Truitt Merle  08/04/2020

## 2020-10-24 NOTE — Telephone Encounter (Signed)
This nurse left a message related to Cira Rue, NP results and recommendations.   No answer.  Left message to return call to clinic.

## 2020-10-28 ENCOUNTER — Telehealth: Payer: Self-pay | Admitting: *Deleted

## 2020-10-28 NOTE — Telephone Encounter (Signed)
Received vm call from pt stating she was returning a call & had left message fri but no one returned her call.  Reviewed chart & noted message left for pt to return call regarding iron levels & suggested per Dr Audie Box to increase iron to tid.  She states she can do this & reports feeling tired lately.  She has had a recent viral infection also.  Suggested getting rest, exercise & eating well & increase iron & hopefully she will feel better soon.  She expressed understanding.

## 2020-11-18 DIAGNOSIS — L720 Epidermal cyst: Secondary | ICD-10-CM | POA: Diagnosis not present

## 2020-11-18 DIAGNOSIS — L814 Other melanin hyperpigmentation: Secondary | ICD-10-CM | POA: Diagnosis not present

## 2020-11-18 DIAGNOSIS — D1801 Hemangioma of skin and subcutaneous tissue: Secondary | ICD-10-CM | POA: Diagnosis not present

## 2020-11-18 DIAGNOSIS — L820 Inflamed seborrheic keratosis: Secondary | ICD-10-CM | POA: Diagnosis not present

## 2020-11-18 DIAGNOSIS — C44319 Basal cell carcinoma of skin of other parts of face: Secondary | ICD-10-CM | POA: Diagnosis not present

## 2020-11-18 DIAGNOSIS — D485 Neoplasm of uncertain behavior of skin: Secondary | ICD-10-CM | POA: Diagnosis not present

## 2020-11-18 DIAGNOSIS — Z85828 Personal history of other malignant neoplasm of skin: Secondary | ICD-10-CM | POA: Diagnosis not present

## 2020-11-18 DIAGNOSIS — L918 Other hypertrophic disorders of the skin: Secondary | ICD-10-CM | POA: Diagnosis not present

## 2020-11-18 DIAGNOSIS — D225 Melanocytic nevi of trunk: Secondary | ICD-10-CM | POA: Diagnosis not present

## 2020-11-18 DIAGNOSIS — L821 Other seborrheic keratosis: Secondary | ICD-10-CM | POA: Diagnosis not present

## 2020-12-25 DIAGNOSIS — Z85828 Personal history of other malignant neoplasm of skin: Secondary | ICD-10-CM | POA: Diagnosis not present

## 2020-12-25 DIAGNOSIS — C441191 Basal cell carcinoma of skin of left upper eyelid, including canthus: Secondary | ICD-10-CM | POA: Diagnosis not present

## 2021-01-07 ENCOUNTER — Other Ambulatory Visit: Payer: Self-pay

## 2021-01-07 ENCOUNTER — Ambulatory Visit
Admission: RE | Admit: 2021-01-07 | Discharge: 2021-01-07 | Disposition: A | Discharge status: Home / Self Care | Payer: Medicare Other | Source: Ambulatory Visit | Attending: Hematology | Admitting: Hematology

## 2021-01-07 ENCOUNTER — Other Ambulatory Visit: Payer: Medicare Other

## 2021-01-07 DIAGNOSIS — M85852 Other specified disorders of bone density and structure, left thigh: Secondary | ICD-10-CM | POA: Diagnosis not present

## 2021-01-07 DIAGNOSIS — E2839 Other primary ovarian failure: Secondary | ICD-10-CM

## 2021-01-08 DIAGNOSIS — I10 Essential (primary) hypertension: Secondary | ICD-10-CM | POA: Diagnosis not present

## 2021-01-22 DIAGNOSIS — H16223 Keratoconjunctivitis sicca, not specified as Sjogren's, bilateral: Secondary | ICD-10-CM | POA: Diagnosis not present

## 2021-01-22 DIAGNOSIS — H2513 Age-related nuclear cataract, bilateral: Secondary | ICD-10-CM | POA: Diagnosis not present

## 2021-01-23 DIAGNOSIS — R300 Dysuria: Secondary | ICD-10-CM | POA: Diagnosis not present

## 2021-01-27 ENCOUNTER — Telehealth: Payer: Self-pay | Admitting: Nurse Practitioner

## 2021-01-27 NOTE — Telephone Encounter (Signed)
Left message to return call to discuss results. Name/phone number provided.   Cira Rue, NP

## 2021-02-02 ENCOUNTER — Telehealth: Payer: Self-pay | Admitting: *Deleted

## 2021-02-02 NOTE — Telephone Encounter (Signed)
Received message from 9/22 vm requesting to talk with [facility] & again today stating that she thinks she needs to be seen.  Returned call & she is in [place] with her daughter who had to have surgery & will return on 02/17/21.  She has had some aching in her back & pelvic area & it gets worse over the day.  She is tired & is resting every day.  She thought she may have a UTI & went to Dr Sheryn Bison but was told no UTI.  Message routed to MD/Pod RN.

## 2021-02-04 ENCOUNTER — Telehealth: Payer: Self-pay | Admitting: Nurse Practitioner

## 2021-02-04 NOTE — Telephone Encounter (Signed)
Scheduled per sch msg. Called and spoke with patient. Confirmed appt  

## 2021-02-19 NOTE — Progress Notes (Signed)
[facility]   [phone] [phone]   Clinic Follow up Note   Patient Care Team: Aurea Graff, PA-C (Inactive) as PCP - General (Physician Assistant) Kem Boroughs, [place] as Nurse Practitioner (Family Medicine) [name], MD as Consulting Physician (General Surgery) Truitt Merle, MD as Consulting Physician (Hematology) Gery Pray, MD as Consulting Physician (Radiation Oncology) 02/23/2021  CHIEF COMPLAINT: Back and pelvic pain, possible UTI.  History of right breast cancer and IDA  SUMMARY OF ONCOLOGIC HISTORY: Oncology History Overview Note  Cancer Staging Breast cancer of upper-outer quadrant of right female breast ([place]) Staging form: Breast, AJCC 8th Edition - Clinical: Stage 0 (cTis (DCIS), cN0, cM0, ER: Negative, PR: Negative) - Signed by Truitt Merle, MD on 05/25/2016 - Pathologic stage from 06/02/2016: Stage IA (pT1a, pN0, cM0, G2, ER: Negative, PR: Negative, HER2: Positive) - Signed by Truitt Merle, MD on 11/29/2016     Breast cancer of upper-outer quadrant of right female breast ([place])  05/18/2016 Mammogram   Diagnostic mammogram of the right breast showed heterogeneous calcifications in a linear orientation within the posterior upper right breast and spanning a distance of 2.2 cm. No associated mass identified.   05/19/2016 Initial Biopsy   Right breast core needle biopsy showed ductal carcinoma with calcification, high-grade, focal highly suspicious for stromal invasion.   05/19/2016 Receptors her2   ER negative, PR negative   05/19/2016 Initial Diagnosis   Ductal carcinoma in situ (DCIS) of right breast   06/02/2016 Surgery   RIGHT BREAST LUMPECTOMY WITH RADIOACTIVE SEED AND SENTINEL LYMPH NODE BIOPSY by Dr. Barry Dienes   06/02/2016 Pathology Results   Diagnosis 06/02/16 1. Breast, lumpectomy, Right w/seed - INVASIVE DUCTAL CARCINOMA, GRADE 2, SPANNING 0.3 CM. - HIGH GRADE DUCTAL CARCINOMA IN SITU WITH NECROSIS. - INVASIVE CARCINOMA  COMES TO WITHIN <0.1 CM OF THE POSTERIOR MARGIN FOCALLY. - DUCTAL CARCINOMA IN SITU COMES TO WITHIN <0.1 CM OF THE POSTERIOR MARGIN FOCALLY AND THE SUPERIOR MARGIN FOCALLY. - BIOPSY SITE. - SEE ONCOLOGY TABLE. 2. Breast, excision, Right additional Medial Margin - FIBROCYSTIC CHANGE. - USUAL DUCTAL HYPERPLASIA. - NO MALIGNANCY IDENTIFIED. 3. Lymph node, sentinel, biopsy, Right Axillary #1 - ONE OF ONE LYMPH NODES NEGATIVE FOR CARCINOMA (0/1). 4. Lymph node, sentinel, biopsy, Right Axillary - ONE OF ONE LYMPH NODES NEGATIVE FOR CARCINOMA (0/1).   06/02/2016 Receptors her2   ER-, PR-, HER2+    07/26/2016 Surgery   RE-EXCISION OF RIGHT BREAST LUMPECTOMY by Dr. Barry Dienes   07/26/2016 Pathology Results   Diagnosis 07/26/16 Breast, excision, Right new Superior Margin - DUCTAL CARCINOMA IN SITU WITH CALCIFICATIONS, HIGH GRADE, SPANNING AT LEAST 1.5 CM. - FOCI HIGHLY SUSPICIOUS FOR EARLY STROMAL INVASION. - DUCTAL CARCINOMA IN SITU IS BROADLY LESS THAN 0.1 CM TO THE NEW SUPERIOR MARGIN. - SEE COMMENT.   08/26/2016 Pathology Results   Diagnosis 08/26/16 Breast, right, needle core biopsy, superior anterior - HIGH GRADE, DUCTAL CARCINOMA IN SITU - SEE COMMENT   09/06/2016 Surgery   RE-EXCISION OF RIGHT BREAST LUMPECTOMY WITH RADIOACTIVE SEED LOCALIZATION by Dr. Barry Dienes   09/06/2016 Pathology Results   Diagnosis 09/06/16 1. Breast, excision, Right Superior Margin - INVASIVE DUCTAL CARCINOMA, GRADE III/III, SCATTERED MICROSCOPIC FOCI. - DUCTAL CARCINOMA IN SITU WITH CALCIFICATIONS, HIGH GRADE. - INVASIVE CARCINOMA IS FOCALLY LESS THAN 0.1 CM TO THE INFERIOR MARGIN OF SPECIMEN #1.. - DUCTAL CARCINOMA IN SITU IS BROADLY LESS THAN 0.1 CM TO THE LATERAL MARGIN, FOCALLY LESS THAN 0.1 CM TO THE INFERIOR MARGIN, AND FOCALLY LESS  THAN 0.1 CM TO THE ANTERIOR MARGIN OF SPECIMEN #1. - SEE COMMENT. 2. Breast, excision, Right Superolateral Margin - FIBROCYSTIC CHANGES. - THERE IS NO EVIDENCE OF  MALIGNANCY. - SEE COMMENT.   10/13/2016 - 11/30/2016 Radiation Therapy   Radiation by Dr. Sondra Come  Radiation treatment dates:   10/13/16 - 11/30/16 Site/dose:    1) Right Breast treated to 50.4 Gy in 28 fractions 2) Right Breast Boosted an additional 12 Gy in 6 fractions             Beams/energy:    1) 3D  //  6X 2) 3D  //  6X Narrative: The patient tolerated radiation treatment relatively well. The patient experienced occasional mild pain in her right breast. She also reported some fatigue and appetite reduction throughout treatment, though this did not affect her activities of daily living. She experienced some radiation related skin changes including moderate erythema across the right breast.    05/31/2017 Mammogram   Diagnostic Mammogram 05/31/17  IMPRESSION: No mammographic evidence of malignancy. RECOMMENDATION: Annual diagnostic mammography.     CURRENT THERAPY:  Breast cancer surveillance Oral iron 3 times a day on Monday Wednesday Friday, then twice daily other days of the week  INTERVAL HISTORY: [name] returns for symptom management visit.  Last seen by me 07/31/2020 for history of breast cancer and anemia.  She continues oral iron 3 times daily MWF then twice a day other days.  She thought she developed a UTI, went to Dr. Sheryn Bison at [facility] but was told no infection, but there was blood and white blood cells in the urine sample.  She developed left low back pain that radiated down her left leg on 9/8 after working in the yard.  She then traveled to [place] help take care of her daughter who had surgery and grandchildren.  Pain was 10/10, she was taking Tylenol up to 3 times a day for the left back pain and pelvic pressure and applying topical muscle relaxer. The left back pain and sciatica are improving, now 3 out of 10 on pain scale.  Denies other new bone or joint pain.  She thinks she still has a UTI, she has intermittent dysuria and urinary frequency, but just notes it "does not feel  right" and is "affecting my thinking" as she forgot to wear a mask here today. Denies headache.  Denies vaginal bleeding.  Otherwise, denies concerns in her breast such as new lump/mass, nipple discharge/inversion, or skin change.  Denies recent fever, chills, cough, chest pain.  She had mild exertional dyspnea going up the stairs while in [place].  Denies abdominal pain.  Bowels moving normally on MiraLAX as needed.    MEDICAL HISTORY:  Past Medical History:  Diagnosis Date   Anxiety    Arthritis    knees   Breast cancer ([place]) 2018   Right Breast   Cancer ([place]) 12/01/11   basal cell skin ca Left upper arm   Cancer ([place]) 04/20/2016   right breast cancer   Complication of anesthesia    Depression    Headache    History of radiation therapy 10/13/16-11/30/16   right breast 50.4 Gy in 28 fractions, right breast boost 12 Gy in 6 fractions   Hypercholesteremia    Personal history of radiation therapy 2018   Right Breast Cancer   PONV (postoperative nausea and vomiting)     SURGICAL HISTORY: Past Surgical History:  Procedure Laterality Date   ABDOMINAL HYSTERECTOMY     BREAST LUMPECTOMY  Right 2018   BREAST LUMPECTOMY WITH RADIOACTIVE SEED AND SENTINEL LYMPH NODE BIOPSY Right 06/02/2016   Procedure: RIGHT BREAST LUMPECTOMY WITH RADIOACTIVE SEED AND SENTINEL LYMPH NODE BIOPSY;  Surgeon: [name], MD;  Location: [facility];  Service: General;  Laterality: Right;   BREAST LUMPECTOMY WITH RADIOACTIVE SEED LOCALIZATION Right 09/06/2016   Procedure: RE-EXCISION OF RIGHT BREAST LUMPECTOMY WITH RADIOACTIVE SEED LOCALIZATION;  Surgeon: [name], MD;  Location: [facility];  Service: General;  Laterality: Right;   BREAST SURGERY  05/19/2016   right breast biopsy   CHOLECYSTECTOMY N/A 06/13/2019   Procedure: LAPAROSCOPIC CHOLECYSTECTOMY;  Surgeon: Coralie Keens, MD;  Location: [facility];  Service: General;  Laterality: N/A;   KNEE ARTHROPLASTY  Left 06/10/2016   Procedure: LEFT TOTAL KNEE ARTHROPLASTY WITH COMPUTER NAVIGATION;  Surgeon: Rod Can, MD;  Location: [facility];  Service: Orthopedics;  Laterality: Left;   KNEE ARTHROPLASTY Right 01/20/2017   Procedure: RIGHT TOTAL KNEE ARTHROPLASTY WITH COMPUTER NAVIGATION;  Surgeon: Rod Can, MD;  Location: [facility];  Service: Orthopedics;  Laterality: Right;  Adductor Block   MOUTH SURGERY     RE-EXCISION OF BREAST LUMPECTOMY Right 07/26/2016   Procedure: RE-EXCISION OF RIGHT BREAST LUMPECTOMY;  Surgeon: [name], MD;  Location: [facility];  Service: General;  Laterality: Right;   SKIN CANCER EXCISION Left 2013   upper arm, basal cell   TONSILLECTOMY AND ADENOIDECTOMY     ([place])   TOTAL VAGINAL HYSTERECTOMY  12/1996   menorrhagia, fibroids    I have reviewed the social history and family history with the patient and they are unchanged from previous note.  ALLERGIES:  is allergic to tramadol, allegra [fexofenadine hcl], ciprofloxacin, estradiol, oxycodone, penicillins, and sulfa antibiotics.  MEDICATIONS:  Current Outpatient Medications  Medication Sig Dispense Refill   acetaminophen (TYLENOL) 500 MG tablet Take 500 mg by mouth every 6 (six) hours as needed.     Calcium Carb-Cholecalciferol (CALCIUM 600 + D) 600-200 MG-UNIT TABS Take 1 tablet by mouth 2 (two) times daily.      cetirizine (ZYRTEC) 10 MG tablet Take 10 mg by mouth at bedtime. At 10 pm daily     diclofenac (VOLTAREN) 75 MG EC tablet Take 75 mg by mouth 2 (two) times daily.     docusate sodium (COLACE) 100 MG capsule Take 1 capsule (100 mg total) by mouth 2 (two) times daily. 60 capsule 1   fenofibrate micronized (LOFIBRA) 134 MG capsule Take 134 mg by mouth daily before breakfast.     fluticasone (FLONASE) 50 MCG/ACT nasal spray Place 1 spray into both nostrils daily.      Glucos-Chond-Hyal Ac-Ca Fructo (MOVE FREE JOINT HEALTH ADVANCE PO) Take 1 tablet by mouth daily.     Magnesium 250 MG TABS  Take 250 mg by mouth 3 (three) times daily.      Melatonin 10 MG CAPS Take by mouth.     Multiple Vitamin (MULTIVITAMIN) tablet Take 1 tablet by mouth daily.     nitrofurantoin, macrocrystal-monohydrate, (MACROBID) 100 MG capsule Take 1 capsule (100 mg total) by mouth 2 (two) times daily. 10 capsule 0   Omega-3 Fatty Acids (FISH OIL) 1000 MG CAPS Take 1,000 mg by mouth 3 (three) times daily. Takes 1-3 times daily     polyethylene glycol (MIRALAX / GLYCOLAX) 17 g packet Take 17 g by mouth daily as needed.     Probiotic Product (PROBIOTIC DAILY PO) Take by mouth.  sertraline (ZOLOFT) 100 MG tablet Take 1 tablet (100 mg total) by mouth daily. (Patient taking differently: Take 100 mg by mouth daily. ) 90 tablet 4   sodium chloride (OCEAN) 0.65 % SOLN nasal spray Place 1 spray into both nostrils 2 (two) times daily as needed for congestion.     tetrahydrozoline 0.05 % ophthalmic solution Place 1 drop into both eyes daily as needed (dry eyes).     traZODone (DESYREL) 100 MG tablet Take 100 mg by mouth at bedtime.     Vitamin D, Ergocalciferol, (DRISDOL) 50000 units CAPS capsule Take 1 capsule (50,000 Units total) by mouth See admin instructions. Takes every other week on Friday 30 capsule 2   vitamin E 400 UNIT capsule Take 400 Units by mouth 3 (three) times daily.      zinc gluconate 50 MG tablet Take 50 mg by mouth daily.     No current facility-administered medications for this visit.    PHYSICAL EXAMINATION: ECOG PERFORMANCE STATUS: 1 - Symptomatic but completely ambulatory  Vitals:   02/23/21 0949  BP: 113/65  Pulse: 67  Resp: 18  Temp: 98.7 F (37.1 C)  SpO2: 96%   Filed Weights   02/23/21 0949  Weight: 139 lb 5 oz (63.2 kg)    GENERAL:alert, no distress and comfortable SKIN: No rash  EYES: sclera clear NECK: Without mass LYMPH:  no palpable cervical or supraclavicular lymphadenopathy LUNGS:  normal breathing effort HEART: no lower extremity edema ABDOMEN:abdomen soft,  non-tender and normal bowel sounds.  No CVAT or pelvic tenderness Musculoskeletal: TTP to the upper lumbar spine NEURO: alert & oriented x 3 with fluent speech, no focal motor/sensory deficits Breast exam: Breasts are symmetric without nipple discharge or inversion.  S/p right lumpectomy, incisions completely healed.  Mild scar tissue without palpable nodularity or mass in either breast or axilla that I could appreciate.  LABORATORY DATA:  I have reviewed the data as listed CBC Latest Ref Rng & Units 02/23/2021 07/31/2020 04/07/2020  WBC 4.0 - 10.5 K/uL 3.4(L) 2.9(L) 11.0(H)  Hemoglobin 12.0 - 15.0 g/dL 11.5(L) 11.5(L) 11.9(L)  Hematocrit 36.0 - 46.0 % 33.9(L) 33.9(L) 36.1  Platelets 150 - 400 K/uL 237 270 285     CMP Latest Ref Rng & Units 02/23/2021 07/31/2020 04/07/2020  Glucose 70 - 99 mg/dL 98 81 101(H)  BUN 8 - 23 mg/dL $Remove'21 20 17  'wsQQMlB$ Creatinine 0.44 - 1.00 mg/dL 0.85 0.86 0.91  Sodium 135 - 145 mmol/L 140 140 139  Potassium 3.5 - 5.1 mmol/L 4.3 4.2 4.2  Chloride 98 - 111 mmol/L 105 103 101  CO2 22 - 32 mmol/L $RemoveB'25 28 28  'JSnBATLa$ Calcium 8.9 - 10.3 mg/dL 9.6 9.2 10.5(H)  Total Protein 6.5 - 8.1 g/dL 6.8 6.8 7.4  Total Bilirubin 0.3 - 1.2 mg/dL 0.3 0.3 0.4  Alkaline Phos 38 - 126 U/L 39 42 53  AST 15 - 41 U/L $Remo'16 19 25  'sEalA$ ALT 0 - 44 U/L $Remo'14 19 26      'zZIRd$ RADIOGRAPHIC STUDIES: I have personally reviewed the radiological images as listed and agreed with the findings in the report. No results found.   ASSESSMENT & PLAN: Dawn West is a 70 y.o. female with    Left lumbar pain with sciatica -onset 01/15/21 after working in the yard. She was in severe pain while in [place] helping her daughter with childcare while she recovered from surgery -managed with tylenol TID and topical muscle relaxer -improved, not requiring medication anymore -ttp at the  lumbar spine -my suspicion for bone metastasis is low to moderate, given her ER/PR negative and HEr2 positive disease  -Xray shows degenerative  changes and arthritis -continue monitoring, f/up with PCP   2. Dysuria and frequency  -she is concerned for recurrent UTI -UA shows trace leukocytes and rare bacteria, nitrate negative.  -culture is pending, if negative I offered her referral to urology versus follow-up with PCP  3. Breast cancer of upper outer quadrant of right breast, invasive ductal carcinoma, pT1aN0M0, stage IA, ER-/PR-/HER2+, and high grade DCIS ER-/PR- -Diagnosed in 05/2016. S/p right breast lumpectomy and 2 breast re-excision surgeries and adjuvant RT.  -she began close surveillance given ER/PR negative and HER-2 positive disease is more aggressive -Mammogram 09/2020 is negative - continue breast cancer surveillance    4. Iron deficient Anemia with thrombocytosis -Based on Epic records, she has been mildly anemia since early 2018.  -Per pt she had a colonoscopy in 2019 which was unremarkable. Anemia workup showed iron deficiency and otherwise negative.  -She started OTC oral ferrous sulfate 1-2 times daily in summer 2019. Anemia and thrombocytosis resolved on 02/2018 labs.  -She developed recurrent IDA, she is followed by Dr. Loreta Ave for reported upper GI ulcer -currently on oral iron TID on MWF then BID on other days  -Today's CBC shows stable mild anemia Hgb 11.5, thrombocytosis resolved.  Iron levels are pending  5. Osteopenia  -Her 05/2018 DEXA shows osteopenia with lowest T-score of -1.6 at left hip. -Continue Calcium and VitD and remain active with weight bearing exercise. Given her 2 knee replacement her orthopedist did not recommend walking.  -DEXA 01/07/2021 showed slight worsening of osteopenia, no high fracture risk.  Continue calcium and vitamin D.   6. S/p cholecystectomy -Due to abdominal pain which started Thanksgiving she had work up and ultimately underwent cholecystectomy on 06/13/19.  -She had inflammation of intestines post surgery and developed RUQ "bulge" -CT 05/2020 per Dr. Magnus Ivan showed right  adrenal nodule and calcifications in the liver favored to represent dropped gall stones but are indeterminate. Stable on repeat CT 07/2020   -Dr. Magnus Ivan does not plan to repeat imaging -denies abd symptoms today     Disposition: Ms. Cory appears stable.  She presents today for lumbar pain with left sciatica that developed after working out in the yard.  Symptoms have improved since initial onset. Exam shows TTP at the upper lumbar spine. Lumbar spine xray shows degenerative changes and arthritis, negative for fracture or bone abnormality.  Continue symptom management and f/up with PCP.   She is concerned she has recurrent UTI, UA shows trace leukocytes and WBC, culture is pending.   She is otherwise doing well from a breast cancer standpoint.  Mammogram 5/22 was negative.  Bone density 12/2020 shows osteopenia without high fracture risk.  She continues calcium and vitamin D.  Labs reviewed, CMP is normal, CBC shows mild stable anemia; iron levels are pending.  Continue oral iron 3 times daily on MWF and twice daily other days of the week.  I will call her with the final results of today's work-up.  Otherwise continue breast cancer surveillance, next scheduled visit 07/2021.  Orders Placed This Encounter  Procedures   Urine Culture    Standing Status:   Future    Number of Occurrences:   1    Standing Expiration Date:   02/23/2022   DG Lumbar Spine Complete    Standing Status:   Future    Standing Expiration Date:  02/23/2022    Order Specific Question:   Reason for Exam (SYMPTOM  OR DIAGNOSIS REQUIRED)    Answer:   1 month h/o low back pain with left sciatica, h/o breast cancer; r/o osseous abnormality/bone mets    Order Specific Question:   Preferred imaging location?    Answer:   [facility]   Urinalysis, Complete w Microscopic    Standing Status:   Future    Number of Occurrences:   1    Standing Expiration Date:   02/23/2022   All questions were answered. The patient  knows to call the clinic with any problems, questions or concerns. No barriers to learning were detected.  Total encounter time was 30 minutes.      Alla Feeling, NP 02/23/21

## 2021-02-23 ENCOUNTER — Other Ambulatory Visit: Payer: Self-pay

## 2021-02-23 ENCOUNTER — Ambulatory Visit (HOSPITAL_COMMUNITY)
Admission: RE | Admit: 2021-02-23 | Discharge: 2021-02-23 | Disposition: A | Discharge status: Home / Self Care | Payer: Medicare Other | Source: Ambulatory Visit | Attending: Nurse Practitioner | Admitting: Nurse Practitioner

## 2021-02-23 ENCOUNTER — Inpatient Hospital Stay: Payer: Medicare Other | Attending: Nurse Practitioner | Admitting: Nurse Practitioner

## 2021-02-23 ENCOUNTER — Encounter: Payer: Self-pay | Admitting: Nurse Practitioner

## 2021-02-23 ENCOUNTER — Inpatient Hospital Stay: Payer: Medicare Other

## 2021-02-23 VITALS — BP 113/65 | HR 67 | Temp 98.7°F | Resp 18 | Wt 139.3 lb

## 2021-02-23 DIAGNOSIS — R300 Dysuria: Secondary | ICD-10-CM

## 2021-02-23 DIAGNOSIS — M545 Low back pain, unspecified: Secondary | ICD-10-CM | POA: Diagnosis not present

## 2021-02-23 DIAGNOSIS — M5442 Lumbago with sciatica, left side: Secondary | ICD-10-CM | POA: Insufficient documentation

## 2021-02-23 DIAGNOSIS — Z171 Estrogen receptor negative status [ER-]: Secondary | ICD-10-CM

## 2021-02-23 DIAGNOSIS — D649 Anemia, unspecified: Secondary | ICD-10-CM

## 2021-02-23 DIAGNOSIS — C50411 Malignant neoplasm of upper-outer quadrant of right female breast: Secondary | ICD-10-CM | POA: Diagnosis not present

## 2021-02-23 LAB — COMPREHENSIVE METABOLIC PANEL
ALT: 14 U/L (ref 0–44)
AST: 16 U/L (ref 15–41)
Albumin: 4.2 g/dL (ref 3.5–5.0)
Alkaline Phosphatase: 39 U/L (ref 38–126)
Anion gap: 10 (ref 5–15)
BUN: 21 mg/dL (ref 8–23)
CO2: 25 mmol/L (ref 22–32)
Calcium: 9.6 mg/dL (ref 8.9–10.3)
Chloride: 105 mmol/L (ref 98–111)
Creatinine, Ser: 0.85 mg/dL (ref 0.44–1.00)
GFR, Estimated: >60 mL/min (ref >60)
Glucose, Bld: 98 mg/dL (ref 70–99)
Potassium: 4.3 mmol/L (ref 3.5–5.1)
Sodium: 140 mmol/L (ref 135–145)
Total Bilirubin: 0.3 mg/dL (ref 0.3–1.2)
Total Protein: 6.8 g/dL (ref 6.5–8.1)

## 2021-02-23 LAB — URINALYSIS, COMPLETE (UACMP) WITH MICROSCOPIC
Bilirubin Urine: NEGATIVE
Glucose, UA: NEGATIVE mg/dL
Hgb urine dipstick: NEGATIVE
Ketones, ur: NEGATIVE mg/dL
Nitrite: NEGATIVE
Protein, ur: NEGATIVE mg/dL
Specific Gravity, Urine: 1.015 (ref 1.005–1.030)
pH: 5 (ref 5.0–8.0)

## 2021-02-23 LAB — CBC WITH DIFFERENTIAL/PLATELET
Abs Immature Granulocytes: 0.01 10*3/uL (ref 0.00–0.07)
Basophils Absolute: 0 10*3/uL (ref 0.0–0.1)
Basophils Relative: 1 %
Eosinophils Absolute: 0.1 10*3/uL (ref 0.0–0.5)
Eosinophils Relative: 4 %
HCT: 33.9 % — ABNORMAL LOW (ref 36.0–46.0)
Hemoglobin: 11.5 g/dL — ABNORMAL LOW (ref 12.0–15.0)
Immature Granulocytes: 0 %
Lymphocytes Relative: 31 %
Lymphs Abs: 1.1 10*3/uL (ref 0.7–4.0)
MCH: 30.9 pg (ref 26.0–34.0)
MCHC: 33.9 g/dL (ref 30.0–36.0)
MCV: 91.1 fL (ref 80.0–100.0)
Monocytes Absolute: 0.3 10*3/uL (ref 0.1–1.0)
Monocytes Relative: 10 %
Neutro Abs: 1.8 10*3/uL (ref 1.7–7.7)
Neutrophils Relative %: 54 %
Platelets: 237 10*3/uL (ref 150–400)
RBC: 3.72 MIL/uL — ABNORMAL LOW (ref 3.87–5.11)
RDW: 12.1 % (ref 11.5–15.5)
WBC: 3.4 10*3/uL — ABNORMAL LOW (ref 4.0–10.5)
nRBC: 0 % (ref 0.0–0.2)

## 2021-02-23 LAB — IRON AND TIBC
Iron: 68 ug/dL (ref 41–142)
Saturation Ratios: 15 % — ABNORMAL LOW (ref 21–57)
TIBC: 466 ug/dL — ABNORMAL HIGH (ref 236–444)
UIBC: 398 ug/dL — ABNORMAL HIGH (ref 120–384)

## 2021-02-23 LAB — FERRITIN: Ferritin: 221 ng/mL (ref 11–307)

## 2021-02-24 LAB — URINE CULTURE

## 2021-02-25 DIAGNOSIS — Z23 Encounter for immunization: Secondary | ICD-10-CM | POA: Diagnosis not present

## 2021-05-15 DIAGNOSIS — H16223 Keratoconjunctivitis sicca, not specified as Sjogren's, bilateral: Secondary | ICD-10-CM | POA: Diagnosis not present

## 2021-05-15 DIAGNOSIS — H16143 Punctate keratitis, bilateral: Secondary | ICD-10-CM | POA: Diagnosis not present

## 2021-05-15 DIAGNOSIS — H0288A Meibomian gland dysfunction right eye, upper and lower eyelids: Secondary | ICD-10-CM | POA: Diagnosis not present

## 2021-05-15 DIAGNOSIS — H0288B Meibomian gland dysfunction left eye, upper and lower eyelids: Secondary | ICD-10-CM | POA: Diagnosis not present

## 2021-07-13 DIAGNOSIS — C50411 Malignant neoplasm of upper-outer quadrant of right female breast: Secondary | ICD-10-CM | POA: Diagnosis not present

## 2021-07-13 DIAGNOSIS — Z1239 Encounter for other screening for malignant neoplasm of breast: Secondary | ICD-10-CM | POA: Diagnosis not present

## 2021-07-13 DIAGNOSIS — E559 Vitamin D deficiency, unspecified: Secondary | ICD-10-CM | POA: Diagnosis not present

## 2021-07-13 DIAGNOSIS — I10 Essential (primary) hypertension: Secondary | ICD-10-CM | POA: Diagnosis not present

## 2021-07-13 DIAGNOSIS — E785 Hyperlipidemia, unspecified: Secondary | ICD-10-CM | POA: Diagnosis not present

## 2021-07-13 DIAGNOSIS — F419 Anxiety disorder, unspecified: Secondary | ICD-10-CM | POA: Diagnosis not present

## 2021-07-13 DIAGNOSIS — J309 Allergic rhinitis, unspecified: Secondary | ICD-10-CM | POA: Diagnosis not present

## 2021-07-13 DIAGNOSIS — Z0000 Encounter for general adult medical examination without abnormal findings: Secondary | ICD-10-CM | POA: Diagnosis not present

## 2021-07-21 DIAGNOSIS — H16223 Keratoconjunctivitis sicca, not specified as Sjogren's, bilateral: Secondary | ICD-10-CM | POA: Diagnosis not present

## 2021-07-21 DIAGNOSIS — H0288A Meibomian gland dysfunction right eye, upper and lower eyelids: Secondary | ICD-10-CM | POA: Diagnosis not present

## 2021-07-21 DIAGNOSIS — H0288B Meibomian gland dysfunction left eye, upper and lower eyelids: Secondary | ICD-10-CM | POA: Diagnosis not present

## 2021-07-29 NOTE — Progress Notes (Signed)
?[place]   ?[phone] [phone]   ?Clinic Follow up Note  ? ?Patient Care Team: ?Aurea Graff, PA-C (Inactive) as PCP - General (Physician Assistant) ?Kem Boroughs, FNP as Nurse Practitioner (Family Medicine) ?[name], MD as Consulting Physician (General Surgery) ?Truitt Merle, MD as Consulting Physician (Hematology) ?Gery Pray, MD as Consulting Physician (Radiation Oncology) ?07/30/2021 ? ?CHIEF COMPLAINT: Follow up R breast cancer and IDA ? ?SUMMARY OF ONCOLOGIC HISTORY: ?Oncology History Overview Note  ?Cancer Staging ?Breast cancer of upper-outer quadrant of right female breast ([place]) ?Staging form: Breast, AJCC 8th Edition ?- Clinical: Stage 0 (cTis (DCIS), cN0, cM0, ER: Negative, PR: Negative) - Signed by Truitt Merle, MD on 05/25/2016 ?- Pathologic stage from 06/02/2016: Stage IA (pT1a, pN0, cM0, G2, ER: Negative, PR: Negative, HER2: Positive) - Signed by Truitt Merle, MD on 11/29/2016 ? ? ?  ?Breast cancer of upper-outer quadrant of right female breast [facility])  ?05/18/2016 Mammogram  ? Diagnostic mammogram of the right breast showed heterogeneous calcifications in a linear orientation within the ?posterior upper right breast and spanning a distance of 2.2 cm. No ?associated mass identified. ?  ?05/19/2016 Initial Biopsy  ? Right breast core needle biopsy showed ductal carcinoma with calcification, high-grade, focal highly suspicious for stromal invasion. ?  ?05/19/2016 Receptors her2  ? ER negative, PR negative ?  ?05/19/2016 Initial Diagnosis  ? Ductal carcinoma in situ (DCIS) of right breast ?  ?06/02/2016 Surgery  ? RIGHT BREAST LUMPECTOMY WITH RADIOACTIVE SEED AND SENTINEL LYMPH NODE BIOPSY by Dr. Barry Dienes ?  ?06/02/2016 Pathology Results  ? Diagnosis 06/02/16 ?1. Breast, lumpectomy, Right w/seed ?- INVASIVE DUCTAL CARCINOMA, GRADE 2, SPANNING 0.3 CM. ?- HIGH GRADE DUCTAL CARCINOMA IN SITU WITH NECROSIS. ?- INVASIVE CARCINOMA COMES TO WITHIN <0.1 CM OF THE POSTERIOR  MARGIN FOCALLY. ?- DUCTAL CARCINOMA IN SITU COMES TO WITHIN <0.1 CM OF THE POSTERIOR MARGIN FOCALLY AND THE ?SUPERIOR MARGIN FOCALLY. ?- BIOPSY SITE. ?- SEE ONCOLOGY TABLE. ?2. Breast, excision, Right additional Medial Margin ?- FIBROCYSTIC CHANGE. ?- USUAL DUCTAL HYPERPLASIA. ?- NO MALIGNANCY IDENTIFIED. ?3. Lymph node, sentinel, biopsy, Right Axillary #1 ?- ONE OF ONE LYMPH NODES NEGATIVE FOR CARCINOMA (0/1). ?4. Lymph node, sentinel, biopsy, Right Axillary ?- ONE OF ONE LYMPH NODES NEGATIVE FOR CARCINOMA (0/1). ?  ?06/02/2016 Receptors her2  ? ER-, PR-, HER2+  ?  ?07/26/2016 Surgery  ? RE-EXCISION OF RIGHT BREAST LUMPECTOMY by Dr. Barry Dienes ?  ?07/26/2016 Pathology Results  ? Diagnosis 07/26/16 ?Breast, excision, Right new Superior Margin ?- DUCTAL CARCINOMA IN SITU WITH CALCIFICATIONS, HIGH GRADE, SPANNING AT LEAST 1.5 CM. ?- FOCI HIGHLY SUSPICIOUS FOR EARLY STROMAL INVASION. ?- DUCTAL CARCINOMA IN SITU IS BROADLY LESS THAN 0.1 CM TO THE NEW SUPERIOR MARGIN. ?- SEE COMMENT. ?  ?08/26/2016 Pathology Results  ? Diagnosis 08/26/16 ?Breast, right, needle core biopsy, superior anterior ?- HIGH GRADE, DUCTAL CARCINOMA IN SITU ?- SEE COMMENT ?  ?09/06/2016 Surgery  ? RE-EXCISION OF RIGHT BREAST LUMPECTOMY WITH RADIOACTIVE SEED LOCALIZATION by Dr. Barry Dienes ?  ?09/06/2016 Pathology Results  ? Diagnosis 09/06/16 ?1. Breast, excision, Right Superior Margin ?- INVASIVE DUCTAL CARCINOMA, GRADE III/III, SCATTERED MICROSCOPIC FOCI. ?- DUCTAL CARCINOMA IN SITU WITH CALCIFICATIONS, HIGH GRADE. ?- INVASIVE CARCINOMA IS FOCALLY LESS THAN 0.1 CM TO THE INFERIOR MARGIN OF SPECIMEN #1.Marland Kitchen ?- DUCTAL CARCINOMA IN SITU IS BROADLY LESS THAN 0.1 CM TO THE LATERAL MARGIN, FOCALLY LESS THAN 0.1 ?CM TO THE INFERIOR MARGIN, AND FOCALLY LESS THAN 0.1 CM TO THE ANTERIOR MARGIN  OF SPECIMEN #1. ?- SEE COMMENT. ?2. Breast, excision, Right Superolateral Margin ?- FIBROCYSTIC CHANGES. ?- THERE IS NO EVIDENCE OF MALIGNANCY. ?- SEE COMMENT. ?  ?10/13/2016 - 11/30/2016  Radiation Therapy  ? Radiation by Dr. Sondra Come ? ?Radiation treatment dates:   10/13/16 - 11/30/16 ?Site/dose:    ?1) Right Breast treated to 50.4 Gy in 28 fractions ?2) Right Breast Boosted an additional 12 Gy in 6 fractions             ?Beams/energy:    ?1) 3D  //  6X ?2) 3D  //  6X ?Narrative: The patient tolerated radiation treatment relatively well. The patient experienced occasional mild pain in her right breast. She also reported some fatigue and appetite reduction throughout treatment, though this did not affect her activities of daily living. She experienced some radiation related skin changes including moderate erythema across the right breast. ? ?  ?05/31/2017 Mammogram  ? Diagnostic Mammogram 05/31/17  ?IMPRESSION: ?No mammographic evidence of malignancy. ?RECOMMENDATION: ?Annual diagnostic mammography. ?  ? ? ?CURRENT THERAPY:  ?Breast cancer surveillance ?Oral iron 3 times a day MWF then BID other days of the week ? ?INTERVAL HISTORY: Dawn West returns for follow up as scheduled. Last seen by me 02/23/21 for symptom management visit for UTI and back pain.  Her back pain resolved with heat application, denies new or worsening bone or joint pain.  Denies concerns in her breast such as new lump/mass, nipple discharge or inversion, or skin change.  She still has intermittent hot flashes mainly after shower and blow drying her hair.  Appetite is normal, energy is low from recently having family in town with grandchildren.  She is compliant with oral iron 3 times a day MWF then twice daily other days.  She denies any bleeding.  Bowel habits are normal.  She sees GI for colonoscopy next year.  She recently stopped Zyrtec and started Singulair, also on treatment for dry eyes.  Denies other infection, new cough, dyspnea, abdominal pain or bloating, or any other new specific concerns. ? ?All other systems were reviewed with the patient and are negative. ? ?MEDICAL HISTORY:  ?Past Medical History:  ?Diagnosis Date  ?  Anxiety   ? Arthritis   ? knees  ? Breast cancer ([place]) 2018  ? Right Breast  ? Cancer ([place]) 12/01/11  ? basal cell skin ca Left upper arm  ? Cancer ([place]) 04/20/2016  ? right breast cancer  ? Complication of anesthesia   ? Depression   ? Headache   ? History of radiation therapy 10/13/16-11/30/16  ? right breast 50.4 Gy in 28 fractions, right breast boost 12 Gy in 6 fractions  ? Hypercholesteremia   ? Personal history of radiation therapy 2018  ? Right Breast Cancer  ? PONV (postoperative nausea and vomiting)   ? ? ?SURGICAL HISTORY: ?Past Surgical History:  ?Procedure Laterality Date  ? ABDOMINAL HYSTERECTOMY    ? BREAST LUMPECTOMY Right 2018  ? BREAST LUMPECTOMY WITH RADIOACTIVE SEED AND SENTINEL LYMPH NODE BIOPSY Right 06/02/2016  ? Procedure: RIGHT BREAST LUMPECTOMY WITH RADIOACTIVE SEED AND SENTINEL LYMPH NODE BIOPSY;  Surgeon: [name], MD;  Location: [facility];  Service: General;  Laterality: Right;  ? BREAST LUMPECTOMY WITH RADIOACTIVE SEED LOCALIZATION Right 09/06/2016  ? Procedure: RE-EXCISION OF RIGHT BREAST LUMPECTOMY WITH RADIOACTIVE SEED LOCALIZATION;  Surgeon: [name], MD;  Location: [facility];  Service: General;  Laterality: Right;  ? BREAST SURGERY  05/19/2016  ? right  breast biopsy  ? CHOLECYSTECTOMY N/A 06/13/2019  ? Procedure: LAPAROSCOPIC CHOLECYSTECTOMY;  Surgeon: Coralie Keens, MD;  Location: [facility];  Service: General;  Laterality: N/A;  ? KNEE ARTHROPLASTY Left 06/10/2016  ? Procedure: LEFT TOTAL KNEE ARTHROPLASTY WITH COMPUTER NAVIGATION;  Surgeon: Rod Can, MD;  Location: [facility];  Service: Orthopedics;  Laterality: Left;  ? KNEE ARTHROPLASTY Right 01/20/2017  ? Procedure: RIGHT TOTAL KNEE ARTHROPLASTY WITH COMPUTER NAVIGATION;  Surgeon: Rod Can, MD;  Location: [facility];  Service: Orthopedics;  Laterality: Right;  Adductor Block  ? MOUTH SURGERY    ? RE-EXCISION OF BREAST LUMPECTOMY Right 07/26/2016  ? Procedure: RE-EXCISION OF  RIGHT BREAST LUMPECTOMY;  Surgeon: [name], MD;  Location: [facility];  Service: General;  Laterality: Right;  ? SKIN CANCER EXCISION Left 2013  ? upper arm, basal cell  ? TONSILLECTOMY AND ADENOIDECTOMY

## 2021-07-30 ENCOUNTER — Other Ambulatory Visit: Payer: Self-pay

## 2021-07-30 ENCOUNTER — Inpatient Hospital Stay: Payer: Medicare Other

## 2021-07-30 ENCOUNTER — Inpatient Hospital Stay: Payer: Medicare Other | Attending: Nurse Practitioner | Admitting: Nurse Practitioner

## 2021-07-30 ENCOUNTER — Encounter: Payer: Self-pay | Admitting: Nurse Practitioner

## 2021-07-30 VITALS — BP 148/69 | HR 67 | Temp 98.2°F | Resp 17 | Ht 62.0 in | Wt 142.3 lb

## 2021-07-30 DIAGNOSIS — Z79899 Other long term (current) drug therapy: Secondary | ICD-10-CM | POA: Insufficient documentation

## 2021-07-30 DIAGNOSIS — Z923 Personal history of irradiation: Secondary | ICD-10-CM | POA: Insufficient documentation

## 2021-07-30 DIAGNOSIS — M8580 Other specified disorders of bone density and structure, unspecified site: Secondary | ICD-10-CM | POA: Insufficient documentation

## 2021-07-30 DIAGNOSIS — C50411 Malignant neoplasm of upper-outer quadrant of right female breast: Secondary | ICD-10-CM

## 2021-07-30 DIAGNOSIS — D75839 Thrombocytosis, unspecified: Secondary | ICD-10-CM | POA: Insufficient documentation

## 2021-07-30 DIAGNOSIS — D509 Iron deficiency anemia, unspecified: Secondary | ICD-10-CM | POA: Diagnosis not present

## 2021-07-30 DIAGNOSIS — Z13220 Encounter for screening for lipoid disorders: Secondary | ICD-10-CM

## 2021-07-30 DIAGNOSIS — Z171 Estrogen receptor negative status [ER-]: Secondary | ICD-10-CM

## 2021-07-30 DIAGNOSIS — Z853 Personal history of malignant neoplasm of breast: Secondary | ICD-10-CM | POA: Insufficient documentation

## 2021-07-30 DIAGNOSIS — D649 Anemia, unspecified: Secondary | ICD-10-CM

## 2021-07-30 DIAGNOSIS — Z1231 Encounter for screening mammogram for malignant neoplasm of breast: Secondary | ICD-10-CM

## 2021-07-30 DIAGNOSIS — Z9049 Acquired absence of other specified parts of digestive tract: Secondary | ICD-10-CM | POA: Insufficient documentation

## 2021-07-30 LAB — FERRITIN: Ferritin: 214 ng/mL (ref 11–307)

## 2021-07-30 LAB — CBC WITH DIFFERENTIAL/PLATELET
Abs Immature Granulocytes: 0.02 10*3/uL (ref 0.00–0.07)
Basophils Absolute: 0 10*3/uL (ref 0.0–0.1)
Basophils Relative: 1 %
Eosinophils Absolute: 0.2 10*3/uL (ref 0.0–0.5)
Eosinophils Relative: 6 %
HCT: 34.2 % — ABNORMAL LOW (ref 36.0–46.0)
Hemoglobin: 11.6 g/dL — ABNORMAL LOW (ref 12.0–15.0)
Immature Granulocytes: 1 %
Lymphocytes Relative: 31 %
Lymphs Abs: 1.1 10*3/uL (ref 0.7–4.0)
MCH: 30.3 pg (ref 26.0–34.0)
MCHC: 33.9 g/dL (ref 30.0–36.0)
MCV: 89.3 fL (ref 80.0–100.0)
Monocytes Absolute: 0.3 10*3/uL (ref 0.1–1.0)
Monocytes Relative: 10 %
Neutro Abs: 1.8 10*3/uL (ref 1.7–7.7)
Neutrophils Relative %: 51 %
Platelets: 222 10*3/uL (ref 150–400)
RBC: 3.83 MIL/uL — ABNORMAL LOW (ref 3.87–5.11)
RDW: 12.1 % (ref 11.5–15.5)
WBC: 3.4 10*3/uL — ABNORMAL LOW (ref 4.0–10.5)
nRBC: 0 % (ref 0.0–0.2)

## 2021-07-30 LAB — COMPREHENSIVE METABOLIC PANEL
ALT: 13 U/L (ref 0–44)
AST: 14 U/L — ABNORMAL LOW (ref 15–41)
Albumin: 4.5 g/dL (ref 3.5–5.0)
Alkaline Phosphatase: 36 U/L — ABNORMAL LOW (ref 38–126)
Anion gap: 7 (ref 5–15)
BUN: 19 mg/dL (ref 8–23)
CO2: 28 mmol/L (ref 22–32)
Calcium: 9.6 mg/dL (ref 8.9–10.3)
Chloride: 106 mmol/L (ref 98–111)
Creatinine, Ser: 0.78 mg/dL (ref 0.44–1.00)
GFR, Estimated: >60 mL/min (ref >60)
Glucose, Bld: 108 mg/dL — ABNORMAL HIGH (ref 70–99)
Potassium: 4 mmol/L (ref 3.5–5.1)
Sodium: 141 mmol/L (ref 135–145)
Total Bilirubin: 0.4 mg/dL (ref 0.3–1.2)
Total Protein: 6.7 g/dL (ref 6.5–8.1)

## 2021-07-30 LAB — LIPID PANEL
Cholesterol: 178 mg/dL (ref 0–200)
HDL: 61 mg/dL (ref >40)
LDL Cholesterol: 96 mg/dL (ref 0–99)
Total CHOL/HDL Ratio: 2.9 RATIO
Triglycerides: 106 mg/dL (ref <150)
VLDL: 21 mg/dL (ref 0–40)

## 2021-07-30 LAB — IRON AND IRON BINDING CAPACITY (CC-WL,HP ONLY)
Iron: 85 ug/dL (ref 28–170)
Saturation Ratios: 17 % (ref 10.4–31.8)
TIBC: 508 ug/dL — ABNORMAL HIGH (ref 250–450)
UIBC: 423 ug/dL (ref 148–442)

## 2021-07-31 ENCOUNTER — Telehealth: Payer: Self-pay | Admitting: Nurse Practitioner

## 2021-07-31 NOTE — Telephone Encounter (Signed)
Scheduled appointment per 03/23 los. Left message.  ? ?

## 2021-08-03 ENCOUNTER — Telehealth: Payer: Self-pay

## 2021-08-03 NOTE — Telephone Encounter (Signed)
-----   Message from Estella Husk, LPN sent at 10/23/4882  9:29 AM EDT ----- ? ?----- Message ----- ?From: Alla Feeling, NP ?Sent: 07/30/2021   2:03 PM EDT ?To: Estella Husk, LPN, [facility] ? ?Joy, please fax lipid panel and today's note to PCP Ammie Dalton at [facility], and let pt know to continue oral iron. She does not need IV iron.  ? ?Thanks, ?Regan Rakers, NP ? ?

## 2021-08-03 NOTE — Telephone Encounter (Signed)
Pt advised of lab results with [facility].. ? ?Note and labs faxed to [place], [place]  [phone].  Confirmation received ?

## 2021-08-21 ENCOUNTER — Other Ambulatory Visit: Payer: Self-pay | Admitting: Nurse Practitioner

## 2021-08-21 ENCOUNTER — Other Ambulatory Visit: Payer: Self-pay | Admitting: Oral Surgery

## 2021-08-21 DIAGNOSIS — Z1231 Encounter for screening mammogram for malignant neoplasm of breast: Secondary | ICD-10-CM

## 2021-10-13 ENCOUNTER — Ambulatory Visit
Admission: RE | Admit: 2021-10-13 | Discharge: 2021-10-13 | Disposition: A | Discharge status: Home / Self Care | Payer: Medicare Other | Source: Ambulatory Visit | Attending: Nurse Practitioner | Admitting: Nurse Practitioner

## 2021-10-13 DIAGNOSIS — Z1231 Encounter for screening mammogram for malignant neoplasm of breast: Secondary | ICD-10-CM | POA: Diagnosis not present

## 2021-10-19 DIAGNOSIS — H0288B Meibomian gland dysfunction left eye, upper and lower eyelids: Secondary | ICD-10-CM | POA: Diagnosis not present

## 2021-10-19 DIAGNOSIS — H16223 Keratoconjunctivitis sicca, not specified as Sjogren's, bilateral: Secondary | ICD-10-CM | POA: Diagnosis not present

## 2021-10-19 DIAGNOSIS — H0288A Meibomian gland dysfunction right eye, upper and lower eyelids: Secondary | ICD-10-CM | POA: Diagnosis not present

## 2021-11-18 DIAGNOSIS — L821 Other seborrheic keratosis: Secondary | ICD-10-CM | POA: Diagnosis not present

## 2021-11-18 DIAGNOSIS — Z85828 Personal history of other malignant neoplasm of skin: Secondary | ICD-10-CM | POA: Diagnosis not present

## 2021-11-18 DIAGNOSIS — L720 Epidermal cyst: Secondary | ICD-10-CM | POA: Diagnosis not present

## 2021-11-18 DIAGNOSIS — L814 Other melanin hyperpigmentation: Secondary | ICD-10-CM | POA: Diagnosis not present

## 2021-11-18 DIAGNOSIS — L918 Other hypertrophic disorders of the skin: Secondary | ICD-10-CM | POA: Diagnosis not present

## 2021-11-18 DIAGNOSIS — D225 Melanocytic nevi of trunk: Secondary | ICD-10-CM | POA: Diagnosis not present

## 2021-11-18 DIAGNOSIS — D1801 Hemangioma of skin and subcutaneous tissue: Secondary | ICD-10-CM | POA: Diagnosis not present

## 2022-01-13 DIAGNOSIS — I10 Essential (primary) hypertension: Secondary | ICD-10-CM | POA: Diagnosis not present

## 2022-01-13 DIAGNOSIS — J309 Allergic rhinitis, unspecified: Secondary | ICD-10-CM | POA: Diagnosis not present

## 2022-01-13 DIAGNOSIS — F419 Anxiety disorder, unspecified: Secondary | ICD-10-CM | POA: Diagnosis not present

## 2022-01-13 DIAGNOSIS — M21611 Bunion of right foot: Secondary | ICD-10-CM | POA: Diagnosis not present

## 2022-01-13 DIAGNOSIS — M21612 Bunion of left foot: Secondary | ICD-10-CM | POA: Diagnosis not present

## 2022-01-13 DIAGNOSIS — E785 Hyperlipidemia, unspecified: Secondary | ICD-10-CM | POA: Diagnosis not present

## 2022-01-13 DIAGNOSIS — H9203 Otalgia, bilateral: Secondary | ICD-10-CM | POA: Diagnosis not present

## 2022-02-10 DIAGNOSIS — Z23 Encounter for immunization: Secondary | ICD-10-CM | POA: Diagnosis not present

## 2022-02-16 DIAGNOSIS — H43813 Vitreous degeneration, bilateral: Secondary | ICD-10-CM | POA: Diagnosis not present

## 2022-02-16 DIAGNOSIS — H0288B Meibomian gland dysfunction left eye, upper and lower eyelids: Secondary | ICD-10-CM | POA: Diagnosis not present

## 2022-02-16 DIAGNOSIS — H0288A Meibomian gland dysfunction right eye, upper and lower eyelids: Secondary | ICD-10-CM | POA: Diagnosis not present

## 2022-02-16 DIAGNOSIS — H2513 Age-related nuclear cataract, bilateral: Secondary | ICD-10-CM | POA: Diagnosis not present

## 2022-04-14 ENCOUNTER — Telehealth: Payer: Self-pay

## 2022-04-14 NOTE — Telephone Encounter (Signed)
Patient called stating she is in [place] with family, she has started having symptoms of right breast tenderness, she has a peas sized knot above her right nipple and she is experiencing soreness in her hips. Wanted to speak with Cira Rue NP. Patient voiced that she will be back from [place] on 12-28. I spoke with NP she stated she could see patient on 1-3 at 12pm and will evaluate her at that time. Patient voiced understanding and was pleased with her plan.

## 2022-05-12 ENCOUNTER — Other Ambulatory Visit: Payer: Self-pay

## 2022-05-12 ENCOUNTER — Encounter: Payer: Self-pay | Admitting: Nurse Practitioner

## 2022-05-12 ENCOUNTER — Inpatient Hospital Stay: Payer: Medicare Other | Attending: Nurse Practitioner | Admitting: Nurse Practitioner

## 2022-05-12 VITALS — BP 155/73 | HR 68 | Temp 98.5°F | Resp 16 | Wt 144.0 lb

## 2022-05-12 DIAGNOSIS — Z79899 Other long term (current) drug therapy: Secondary | ICD-10-CM | POA: Insufficient documentation

## 2022-05-12 DIAGNOSIS — D75839 Thrombocytosis, unspecified: Secondary | ICD-10-CM | POA: Diagnosis not present

## 2022-05-12 DIAGNOSIS — M25569 Pain in unspecified knee: Secondary | ICD-10-CM | POA: Insufficient documentation

## 2022-05-12 DIAGNOSIS — N644 Mastodynia: Secondary | ICD-10-CM | POA: Diagnosis not present

## 2022-05-12 DIAGNOSIS — D509 Iron deficiency anemia, unspecified: Secondary | ICD-10-CM | POA: Insufficient documentation

## 2022-05-12 DIAGNOSIS — Z853 Personal history of malignant neoplasm of breast: Secondary | ICD-10-CM | POA: Diagnosis not present

## 2022-05-12 DIAGNOSIS — M545 Low back pain, unspecified: Secondary | ICD-10-CM | POA: Diagnosis not present

## 2022-05-12 DIAGNOSIS — M8580 Other specified disorders of bone density and structure, unspecified site: Secondary | ICD-10-CM | POA: Insufficient documentation

## 2022-05-12 NOTE — Progress Notes (Signed)
[place]   [phone] [phone]   Clinic Follow up Note   Patient Care Team: Aurea Graff, PA-C (Inactive) as PCP - General (Physician Assistant) Kem Boroughs, [place] as Nurse Practitioner (Family Medicine) [name], MD as Consulting Physician (General Surgery) Truitt Merle, MD as Consulting Physician (Hematology) Gery Pray, MD as Consulting Physician (Radiation Oncology) 05/12/2022   CHIEF COMPLAINT: right breast pain  SUMMARY OF ONCOLOGIC HISTORY: Oncology History Overview Note  Cancer Staging Breast cancer of upper-outer quadrant of right female breast [facility]) Staging form: Breast, AJCC 8th Edition - Clinical: Stage 0 (cTis (DCIS), cN0, cM0, ER: Negative, PR: Negative) - Signed by Truitt Merle, MD on 05/25/2016 - Pathologic stage from 06/02/2016: Stage IA (pT1a, pN0, cM0, G2, ER: Negative, PR: Negative, HER2: Positive) - Signed by Truitt Merle, MD on 11/29/2016     Breast cancer of upper-outer quadrant of right female breast ([place])  05/18/2016 Mammogram   Diagnostic mammogram of the right breast showed heterogeneous calcifications in a linear orientation within the posterior upper right breast and spanning a distance of 2.2 cm. No associated mass identified.   05/19/2016 Initial Biopsy   Right breast core needle biopsy showed ductal carcinoma with calcification, high-grade, focal highly suspicious for stromal invasion.   05/19/2016 Receptors her2   ER negative, PR negative   05/19/2016 Initial Diagnosis   Ductal carcinoma in situ (DCIS) of right breast   06/02/2016 Surgery   RIGHT BREAST LUMPECTOMY WITH RADIOACTIVE SEED AND SENTINEL LYMPH NODE BIOPSY by Dr. Barry Dienes   06/02/2016 Pathology Results   Diagnosis 06/02/16 1. Breast, lumpectomy, Right w/seed - INVASIVE DUCTAL CARCINOMA, GRADE 2, SPANNING 0.3 CM. - HIGH GRADE DUCTAL CARCINOMA IN SITU WITH NECROSIS. - INVASIVE CARCINOMA COMES TO WITHIN <0.1 CM OF THE POSTERIOR MARGIN FOCALLY. -  DUCTAL CARCINOMA IN SITU COMES TO WITHIN <0.1 CM OF THE POSTERIOR MARGIN FOCALLY AND THE SUPERIOR MARGIN FOCALLY. - BIOPSY SITE. - SEE ONCOLOGY TABLE. 2. Breast, excision, Right additional Medial Margin - FIBROCYSTIC CHANGE. - USUAL DUCTAL HYPERPLASIA. - NO MALIGNANCY IDENTIFIED. 3. Lymph node, sentinel, biopsy, Right Axillary #1 - ONE OF ONE LYMPH NODES NEGATIVE FOR CARCINOMA (0/1). 4. Lymph node, sentinel, biopsy, Right Axillary - ONE OF ONE LYMPH NODES NEGATIVE FOR CARCINOMA (0/1).   06/02/2016 Receptors her2   ER-, PR-, HER2+    07/26/2016 Surgery   RE-EXCISION OF RIGHT BREAST LUMPECTOMY by Dr. Barry Dienes   07/26/2016 Pathology Results   Diagnosis 07/26/16 Breast, excision, Right new Superior Margin - DUCTAL CARCINOMA IN SITU WITH CALCIFICATIONS, HIGH GRADE, SPANNING AT LEAST 1.5 CM. - FOCI HIGHLY SUSPICIOUS FOR EARLY STROMAL INVASION. - DUCTAL CARCINOMA IN SITU IS BROADLY LESS THAN 0.1 CM TO THE NEW SUPERIOR MARGIN. - SEE COMMENT.   08/26/2016 Pathology Results   Diagnosis 08/26/16 Breast, right, needle core biopsy, superior anterior - HIGH GRADE, DUCTAL CARCINOMA IN SITU - SEE COMMENT   09/06/2016 Surgery   RE-EXCISION OF RIGHT BREAST LUMPECTOMY WITH RADIOACTIVE SEED LOCALIZATION by Dr. Barry Dienes   09/06/2016 Pathology Results   Diagnosis 09/06/16 1. Breast, excision, Right Superior Margin - INVASIVE DUCTAL CARCINOMA, GRADE III/III, SCATTERED MICROSCOPIC FOCI. - DUCTAL CARCINOMA IN SITU WITH CALCIFICATIONS, HIGH GRADE. - INVASIVE CARCINOMA IS FOCALLY LESS THAN 0.1 CM TO THE INFERIOR MARGIN OF SPECIMEN #1.. - DUCTAL CARCINOMA IN SITU IS BROADLY LESS THAN 0.1 CM TO THE LATERAL MARGIN, FOCALLY LESS THAN 0.1 CM TO THE INFERIOR MARGIN, AND FOCALLY LESS THAN 0.1 CM TO THE ANTERIOR MARGIN OF SPECIMEN #1. -  SEE COMMENT. 2. Breast, excision, Right Superolateral Margin - FIBROCYSTIC CHANGES. - THERE IS NO EVIDENCE OF MALIGNANCY. - SEE COMMENT.   10/13/2016 - 11/30/2016 Radiation Therapy    Radiation by Dr. Sondra Come  Radiation treatment dates:   10/13/16 - 11/30/16 Site/dose:    1) Right Breast treated to 50.4 Gy in 28 fractions 2) Right Breast Boosted an additional 12 Gy in 6 fractions             Beams/energy:    1) 3D  //  6X 2) 3D  //  6X Narrative: The patient tolerated radiation treatment relatively well. The patient experienced occasional mild pain in her right breast. She also reported some fatigue and appetite reduction throughout treatment, though this did not affect her activities of daily living. She experienced some radiation related skin changes including moderate erythema across the right breast.    05/31/2017 Mammogram   Diagnostic Mammogram 05/31/17  IMPRESSION: No mammographic evidence of malignancy. RECOMMENDATION: Annual diagnostic mammography.     INTERVAL HISTORY: Ms. Dymek returns for follow up as scheduled. Last seen by me 07/30/21. She presents for symptom management visit for right breast pain that started early to mid November, before Thanksgiving. Breast was tender and she could palpate a "knot" in the high right breast that migrated to the side. She went to [place] for the holiday and it seemed to improve some, but not gone away. Denies nipple discharge, redness, warmth, fever, or chills. She has b/l hip, low back, and knee pains she attributes to arthritis. Pain resolves with Aspercreme, and gets worse in cold weather.    REVIEW OF SYSTEMS:   All other systems were reviewed with the patient and are negative.  MEDICAL HISTORY:  Past Medical History:  Diagnosis Date   Anxiety    Arthritis    knees   Breast cancer ([place]) 2018   Right Breast   Cancer ([place]) 12/01/11   basal cell skin ca Left upper arm   Cancer ([place]) 04/20/2016   right breast cancer   Complication of anesthesia    Depression    Headache    History of radiation therapy 10/13/16-11/30/16   right breast 50.4 Gy in 28 fractions, right breast boost 12 Gy in 6 fractions    Hypercholesteremia    Personal history of radiation therapy 2018   Right Breast Cancer   PONV (postoperative nausea and vomiting)     SURGICAL HISTORY: Past Surgical History:  Procedure Laterality Date   ABDOMINAL HYSTERECTOMY     BREAST LUMPECTOMY Right 2018   BREAST LUMPECTOMY WITH RADIOACTIVE SEED AND SENTINEL LYMPH NODE BIOPSY Right 06/02/2016   Procedure: RIGHT BREAST LUMPECTOMY WITH RADIOACTIVE SEED AND SENTINEL LYMPH NODE BIOPSY;  Surgeon: [name], MD;  Location: [facility];  Service: General;  Laterality: Right;   BREAST LUMPECTOMY WITH RADIOACTIVE SEED LOCALIZATION Right 09/06/2016   Procedure: RE-EXCISION OF RIGHT BREAST LUMPECTOMY WITH RADIOACTIVE SEED LOCALIZATION;  Surgeon: [name], MD;  Location: [facility];  Service: General;  Laterality: Right;   BREAST SURGERY  05/19/2016   right breast biopsy   CHOLECYSTECTOMY N/A 06/13/2019   Procedure: LAPAROSCOPIC CHOLECYSTECTOMY;  Surgeon: Coralie Keens, MD;  Location: [facility];  Service: General;  Laterality: N/A;   KNEE ARTHROPLASTY Left 06/10/2016   Procedure: LEFT TOTAL KNEE ARTHROPLASTY WITH COMPUTER NAVIGATION;  Surgeon: Rod Can, MD;  Location: [facility];  Service: Orthopedics;  Laterality: Left;   KNEE ARTHROPLASTY Right 01/20/2017   Procedure: RIGHT TOTAL  KNEE ARTHROPLASTY WITH COMPUTER NAVIGATION;  Surgeon: Rod Can, MD;  Location: [facility];  Service: Orthopedics;  Laterality: Right;  Adductor Block   MOUTH SURGERY     RE-EXCISION OF BREAST LUMPECTOMY Right 07/26/2016   Procedure: RE-EXCISION OF RIGHT BREAST LUMPECTOMY;  Surgeon: [name], MD;  Location: [facility];  Service: General;  Laterality: Right;   SKIN CANCER EXCISION Left 2013   upper arm, basal cell   TONSILLECTOMY AND ADENOIDECTOMY     ([place])   TOTAL VAGINAL HYSTERECTOMY  12/1996   menorrhagia, fibroids    I have reviewed the social history and family history with the patient  and they are unchanged from previous note.  ALLERGIES:  is allergic to tramadol, allegra [fexofenadine hcl], ciprofloxacin, estradiol, oxycodone, penicillins, and sulfa antibiotics.  MEDICATIONS:  Current Outpatient Medications  Medication Sig Dispense Refill   acetaminophen (TYLENOL) 500 MG tablet Take 500 mg by mouth every 6 (six) hours as needed.     Calcium Carb-Cholecalciferol (CALCIUM 600 + D) 600-200 MG-UNIT TABS Take 1 tablet by mouth 2 (two) times daily.      docusate sodium (COLACE) 100 MG capsule Take 1 capsule (100 mg total) by mouth 2 (two) times daily. 60 capsule 1   fenofibrate micronized (LOFIBRA) 134 MG capsule Take 134 mg by mouth daily before breakfast.     fluticasone (FLONASE) 50 MCG/ACT nasal spray Place 1 spray into both nostrils daily.     Glucos-Chond-Hyal Ac-Ca Fructo (MOVE FREE JOINT HEALTH ADVANCE PO) Take 1 tablet by mouth daily.     Magnesium 250 MG TABS Take 250 mg by mouth 3 (three) times daily.      Melatonin 10 MG CAPS Take by mouth.     Multiple Vitamin (MULTIVITAMIN) tablet Take 1 tablet by mouth daily.     nitrofurantoin, macrocrystal-monohydrate, (MACROBID) 100 MG capsule Take 1 capsule (100 mg total) by mouth 2 (two) times daily. 10 capsule 0   Omega-3 Fatty Acids (FISH OIL) 1000 MG CAPS Take 1,000 mg by mouth 3 (three) times daily. Takes 1-3 times daily     polyethylene glycol (MIRALAX / GLYCOLAX) 17 g packet Take 17 g by mouth daily as needed.     Probiotic Product (PROBIOTIC DAILY PO) Take by mouth.     sertraline (ZOLOFT) 100 MG tablet Take 1 tablet (100 mg total) by mouth daily. (Patient taking differently: Take 100 mg by mouth daily.) 90 tablet 4   sodium chloride (OCEAN) 0.65 % SOLN nasal spray Place 1 spray into both nostrils 2 (two) times daily as needed for congestion.     tetrahydrozoline 0.05 % ophthalmic solution Place 1 drop into both eyes daily as needed (dry eyes).     traZODone (DESYREL) 100 MG tablet Take 100 mg by mouth at bedtime.      Vitamin D, Ergocalciferol, (DRISDOL) 50000 units CAPS capsule Take 1 capsule (50,000 Units total) by mouth See admin instructions. Takes every other week on Friday 30 capsule 2   vitamin E 400 UNIT capsule Take 400 Units by mouth 3 (three) times daily.      zinc gluconate 50 MG tablet Take 50 mg by mouth daily.     cetirizine (ZYRTEC) 10 MG tablet Take 10 mg by mouth at bedtime. At 10 pm daily (Patient not taking: Reported on 05/12/2022)     diclofenac (VOLTAREN) 75 MG EC tablet Take 75 mg by mouth 2 (two) times daily. (Patient not taking: Reported on 05/12/2022)  No current facility-administered medications for this visit.    PHYSICAL EXAMINATION: GENERAL:alert, no distress and comfortable SKIN: no rash  EYES: sclera clear NECK: without mass LYMPH:  no palpable cervical or supraclavicular lymphadenopathy  LUNGS: normal breathing effort ABDOMEN: abdomen soft, non-tender and normal bowel sounds NEURO: alert & oriented x 3 with fluent speech Breast exam: no b/l nipple discharge or inversion. S/p right lumpectomy, incisions completely healed with mild scar tissue and skin retraction at the areola. R breast diffusely dense and ttp. No discrete mass, but exam considerably limited by pain. Left breast and both axilla benign  LABORATORY DATA:  I have reviewed the data as listed    Latest Ref Rng & Units 07/30/2021    9:20 AM 02/23/2021    9:13 AM 07/31/2020    9:05 AM  CBC  WBC 4.0 - 10.5 K/uL 3.4  3.4  2.9   Hemoglobin 12.0 - 15.0 g/dL 11.6  11.5  11.5   Hematocrit 36.0 - 46.0 % 34.2  33.9  33.9   Platelets 150 - 400 K/uL 222  237  270         Latest Ref Rng & Units 07/30/2021    9:20 AM 02/23/2021    9:13 AM 07/31/2020    9:05 AM  CMP  Glucose 70 - 99 mg/dL 108  98  81   BUN 8 - 23 mg/dL _0 Creatinine 0.44 - 1.00 mg/dL 0.78  0.85  0.86   Sodium 135 - 145 mmol/L 141  140  140   Potassium 3.5 - 5.1 mmol/L 4.0  4.3  4.2   Chloride 98 - 111 mmol/L 106  105  103   CO2 22 -  32 mmol/L _1 Calcium 8.9 - 10.3 mg/dL 9.6  9.6  9.2   Total Protein 6.5 - 8.1 g/dL 6.7  6.8  6.8   Total Bilirubin 0.3 - 1.2 mg/dL 0.4  0.3  0.3   Alkaline Phos 38 - 126 U/L 36  39  42   AST 15 - 41 U/L _2 ALT 0 - 44 U/L _3 RADIOGRAPHIC STUDIES: I have personally reviewed the radiological images as listed and agreed with the findings in the report. No results found.    ASSESSMENT & PLAN: Malka Bocek is a 72 y.o. female with    Right breast pain -onset ~03/21/22, with pain and palpable lump in the upper right breast that migrated to the outer breast -pain improved somewhat, lump not palpable -On today's exam she has diffuse thickening of the right breast, possibly from previous radiation, but very tender throughout. I do not palpate a discrete mass, but my exam was considerably limited by pain -Will proceed with diagnostic R mammo/US. I will call her with results and recommendations.   2. Breast cancer of upper outer quadrant of right breast, invasive ductal carcinoma, pT1aN0M0, stage IA, ER-/PR-/HER2+, and high grade DCIS ER-/PR- -Diagnosed in 05/2016. S/p right breast lumpectomy, 2 breast re-excision surgeries, and adjuvant RT.  -she began close surveillance given ER/PR negative and HER-2 positive disease is more aggressive -Mammogram 10/13/21 negative - continue breast cancer surveillance    2. Iron deficient Anemia with thrombocytosis -Based on Epic records, she has been mildly anemia since early 2018.  -Per pt she had a colonoscopy in 2019 which was unremarkable. Anemia workup showed iron deficiency  and otherwise negative.  -She started OTC oral ferrous sulfate 1-2 times daily in summer 2019. Anemia and thrombocytosis resolved on 02/2018 labs.  -She developed recurrent IDA, she is followed by Dr. Collene Mares for reported upper GI ulcer -takes oral iron -not discussed today   3. Left lumbar pain with sciatica, b/l hip and knee pains -lumbar  pain onset 01/15/21 after working in the yard. She was in severe pain while in [place] helping her daughter with childcare while she recovered from surgery -managed with tylenol TID and topical muscle relaxe -Xray showed degenerative changes and arthritis -she has arthritic pains, worse in cold weather; resolves with Aspercreme    4. Osteopenia  -Her 05/2018 DEXA shows osteopenia with lowest T-score of -1.6 at left hip. -DEXA 01/07/2021 showed osteopenia at the left femur neck T score -1.8 with FRAX score 11% risk of major osteoporotic fracture and 2% risk of hip fracture -Continue Calcium and VitD and remain active with weight bearing exercise. Given her 2 knee replacement her orthopedist did not recommend walking.  -Repeat 12/2022    PLAN: -Diagnostic R mammo/US 05/19/22, I will call her with results -Next f/up in 07/2022 already scheduled, will adjust if necessary   I discussed the assessment and treatment plan with the patient. The patient was provided an opportunity to ask questions and all were answered. The patient agreed with the plan and demonstrated an understanding of the instructions.   I provided 25 minutes minutes of face-to-face video visit time during this encounter, and > 50% was spent counseling as documented under my assessment & plan.     Alla Feeling, NP 05/12/22

## 2022-05-19 ENCOUNTER — Ambulatory Visit
Admission: RE | Admit: 2022-05-19 | Discharge: 2022-05-19 | Disposition: A | Discharge status: Home / Self Care | Payer: Medicare Other | Source: Ambulatory Visit | Attending: Nurse Practitioner | Admitting: Nurse Practitioner

## 2022-05-19 ENCOUNTER — Ambulatory Visit: Payer: Medicare Other

## 2022-05-19 DIAGNOSIS — N644 Mastodynia: Secondary | ICD-10-CM | POA: Diagnosis not present

## 2022-05-25 ENCOUNTER — Telehealth: Payer: Self-pay

## 2022-05-25 NOTE — Telephone Encounter (Addendum)
Called patient to relay message below. Patient had full understanding.     ----- Message from Alla Feeling, NP sent at 05/24/2022  2:07 PM EST ----- I have tried calling her twice, no answer. Please let her know R mammo is negative. Please see how she is doing with breast pain and let me know.  Thanks, Regan Rakers NP

## 2022-07-27 DIAGNOSIS — K219 Gastro-esophageal reflux disease without esophagitis: Secondary | ICD-10-CM | POA: Diagnosis not present

## 2022-07-27 DIAGNOSIS — Z1211 Encounter for screening for malignant neoplasm of colon: Secondary | ICD-10-CM | POA: Diagnosis not present

## 2022-07-27 DIAGNOSIS — Z86010 Personal history of colonic polyps: Secondary | ICD-10-CM | POA: Diagnosis not present

## 2022-07-27 DIAGNOSIS — K5904 Chronic idiopathic constipation: Secondary | ICD-10-CM | POA: Diagnosis not present

## 2022-08-04 ENCOUNTER — Other Ambulatory Visit: Payer: Self-pay | Admitting: Nurse Practitioner

## 2022-08-04 DIAGNOSIS — Z171 Estrogen receptor negative status [ER-]: Secondary | ICD-10-CM

## 2022-08-04 NOTE — Progress Notes (Unsigned)
Patient Care Team: Aurea Graff, PA-C (Inactive) as PCP - General (Physician Assistant) Kem Boroughs, [place] as Nurse Practitioner (Family Medicine) [name], MD as Consulting Physician (General Surgery) Truitt Merle, MD as Consulting Physician (Hematology) Gery Pray, MD as Consulting Physician (Radiation Oncology)   CHIEF COMPLAINT: Follow up right breast pain   Oncology History Overview Note  Cancer Staging Breast cancer of upper-outer quadrant of right female breast [facility]) Staging form: Breast, AJCC 8th Edition - Clinical: Stage 0 (cTis (DCIS), cN0, cM0, ER: Negative, PR: Negative) - Signed by Truitt Merle, MD on 05/25/2016 - Pathologic stage from 06/02/2016: Stage IA (pT1a, pN0, cM0, G2, ER: Negative, PR: Negative, HER2: Positive) - Signed by Truitt Merle, MD on 11/29/2016     Breast cancer of upper-outer quadrant of right female breast ([place])  05/18/2016 Mammogram   Diagnostic mammogram of the right breast showed heterogeneous calcifications in a linear orientation within the posterior upper right breast and spanning a distance of 2.2 cm. No associated mass identified.   05/19/2016 Initial Biopsy   Right breast core needle biopsy showed ductal carcinoma with calcification, high-grade, focal highly suspicious for stromal invasion.   05/19/2016 Receptors her2   ER negative, PR negative   05/19/2016 Initial Diagnosis   Ductal carcinoma in situ (DCIS) of right breast   06/02/2016 Surgery   RIGHT BREAST LUMPECTOMY WITH RADIOACTIVE SEED AND SENTINEL LYMPH NODE BIOPSY by Dr. Barry Dienes   06/02/2016 Pathology Results   Diagnosis 06/02/16 1. Breast, lumpectomy, Right w/seed - INVASIVE DUCTAL CARCINOMA, GRADE 2, SPANNING 0.3 CM. - HIGH GRADE DUCTAL CARCINOMA IN SITU WITH NECROSIS. - INVASIVE CARCINOMA COMES TO WITHIN <0.1 CM OF THE POSTERIOR MARGIN FOCALLY. - DUCTAL CARCINOMA IN SITU COMES TO WITHIN <0.1 CM OF THE POSTERIOR MARGIN FOCALLY AND THE SUPERIOR MARGIN FOCALLY. - BIOPSY  SITE. - SEE ONCOLOGY TABLE. 2. Breast, excision, Right additional Medial Margin - FIBROCYSTIC CHANGE. - USUAL DUCTAL HYPERPLASIA. - NO MALIGNANCY IDENTIFIED. 3. Lymph node, sentinel, biopsy, Right Axillary #1 - ONE OF ONE LYMPH NODES NEGATIVE FOR CARCINOMA (0/1). 4. Lymph node, sentinel, biopsy, Right Axillary - ONE OF ONE LYMPH NODES NEGATIVE FOR CARCINOMA (0/1).   06/02/2016 Receptors her2   ER-, PR-, HER2+    07/26/2016 Surgery   RE-EXCISION OF RIGHT BREAST LUMPECTOMY by Dr. Barry Dienes   07/26/2016 Pathology Results   Diagnosis 07/26/16 Breast, excision, Right new Superior Margin - DUCTAL CARCINOMA IN SITU WITH CALCIFICATIONS, HIGH GRADE, SPANNING AT LEAST 1.5 CM. - FOCI HIGHLY SUSPICIOUS FOR EARLY STROMAL INVASION. - DUCTAL CARCINOMA IN SITU IS BROADLY LESS THAN 0.1 CM TO THE NEW SUPERIOR MARGIN. - SEE COMMENT.   08/26/2016 Pathology Results   Diagnosis 08/26/16 Breast, right, needle core biopsy, superior anterior - HIGH GRADE, DUCTAL CARCINOMA IN SITU - SEE COMMENT   09/06/2016 Surgery   RE-EXCISION OF RIGHT BREAST LUMPECTOMY WITH RADIOACTIVE SEED LOCALIZATION by Dr. Barry Dienes   09/06/2016 Pathology Results   Diagnosis 09/06/16 1. Breast, excision, Right Superior Margin - INVASIVE DUCTAL CARCINOMA, GRADE III/III, SCATTERED MICROSCOPIC FOCI. - DUCTAL CARCINOMA IN SITU WITH CALCIFICATIONS, HIGH GRADE. - INVASIVE CARCINOMA IS FOCALLY LESS THAN 0.1 CM TO THE INFERIOR MARGIN OF SPECIMEN #1.. - DUCTAL CARCINOMA IN SITU IS BROADLY LESS THAN 0.1 CM TO THE LATERAL MARGIN, FOCALLY LESS THAN 0.1 CM TO THE INFERIOR MARGIN, AND FOCALLY LESS THAN 0.1 CM TO THE ANTERIOR MARGIN OF SPECIMEN #1. - SEE COMMENT. 2. Breast, excision, Right Superolateral Margin - FIBROCYSTIC CHANGES. - THERE IS NO EVIDENCE OF  MALIGNANCY. - SEE COMMENT.   10/13/2016 - 11/30/2016 Radiation Therapy   Radiation by Dr. Sondra Come  Radiation treatment dates:   10/13/16 - 11/30/16 Site/dose:    1) Right Breast treated to 50.4 Gy  in 28 fractions 2) Right Breast Boosted an additional 12 Gy in 6 fractions             Beams/energy:    1) 3D  //  6X 2) 3D  //  6X Narrative: The patient tolerated radiation treatment relatively well. The patient experienced occasional mild pain in her right breast. She also reported some fatigue and appetite reduction throughout treatment, though this did not affect her activities of daily living. She experienced some radiation related skin changes including moderate erythema across the right breast.    05/31/2017 Mammogram   Diagnostic Mammogram 05/31/17  IMPRESSION: No mammographic evidence of malignancy. RECOMMENDATION: Annual diagnostic mammography.      CURRENT THERAPY: Surveillance   INTERVAL HISTORY Dawn West returns for routine follow up as scheduled. Last seen by me 05/12/22 for right breast pain. Diagnostic R mammo was benign. Bilateral mammo due in June.   ROS   Past Medical History:  Diagnosis Date   Anxiety    Arthritis    knees   Breast cancer ([place]) 2018   Right Breast   Cancer ([place]) 12/01/11   basal cell skin ca Left upper arm   Cancer ([place]) 04/20/2016   right breast cancer   Complication of anesthesia    Depression    Headache    History of radiation therapy 10/13/16-11/30/16   right breast 50.4 Gy in 28 fractions, right breast boost 12 Gy in 6 fractions   Hypercholesteremia    Personal history of radiation therapy 2018   Right Breast Cancer   PONV (postoperative nausea and vomiting)      Past Surgical History:  Procedure Laterality Date   ABDOMINAL HYSTERECTOMY     BREAST LUMPECTOMY Right 2018   BREAST LUMPECTOMY WITH RADIOACTIVE SEED AND SENTINEL LYMPH NODE BIOPSY Right 06/02/2016   Procedure: RIGHT BREAST LUMPECTOMY WITH RADIOACTIVE SEED AND SENTINEL LYMPH NODE BIOPSY;  Surgeon: [name], MD;  Location: [facility];  Service: General;  Laterality: Right;   BREAST LUMPECTOMY WITH RADIOACTIVE SEED LOCALIZATION Right 09/06/2016    Procedure: RE-EXCISION OF RIGHT BREAST LUMPECTOMY WITH RADIOACTIVE SEED LOCALIZATION;  Surgeon: [name], MD;  Location: [facility];  Service: General;  Laterality: Right;   BREAST SURGERY  05/19/2016   right breast biopsy   CHOLECYSTECTOMY N/A 06/13/2019   Procedure: LAPAROSCOPIC CHOLECYSTECTOMY;  Surgeon: Coralie Keens, MD;  Location: [facility];  Service: General;  Laterality: N/A;   KNEE ARTHROPLASTY Left 06/10/2016   Procedure: LEFT TOTAL KNEE ARTHROPLASTY WITH COMPUTER NAVIGATION;  Surgeon: Rod Can, MD;  Location: [facility];  Service: Orthopedics;  Laterality: Left;   KNEE ARTHROPLASTY Right 01/20/2017   Procedure: RIGHT TOTAL KNEE ARTHROPLASTY WITH COMPUTER NAVIGATION;  Surgeon: Rod Can, MD;  Location: [facility];  Service: Orthopedics;  Laterality: Right;  Adductor Block   MOUTH SURGERY     RE-EXCISION OF BREAST LUMPECTOMY Right 07/26/2016   Procedure: RE-EXCISION OF RIGHT BREAST LUMPECTOMY;  Surgeon: [name], MD;  Location: [place] [facility];  Service: General;  Laterality: Right;   SKIN CANCER EXCISION Left 2013   upper arm, basal cell   TONSILLECTOMY AND ADENOIDECTOMY     (DEVIATED SEPTUM  SAME TIME)   TOTAL VAGINAL HYSTERECTOMY  12/1996   menorrhagia, fibroids  Outpatient Encounter Medications as of 08/05/2022  Medication Sig   acetaminophen (TYLENOL) 500 MG tablet Take 500 mg by mouth every 6 (six) hours as needed.   Calcium Carb-Cholecalciferol (CALCIUM 600 + D) 600-200 MG-UNIT TABS Take 1 tablet by mouth 2 (two) times daily.    cetirizine (ZYRTEC) 10 MG tablet Take 10 mg by mouth at bedtime. At 10 pm daily (Patient not taking: Reported on 05/12/2022)   diclofenac (VOLTAREN) 75 MG EC tablet Take 75 mg by mouth 2 (two) times daily. (Patient not taking: Reported on 05/12/2022)   docusate sodium (COLACE) 100 MG capsule Take 1 capsule (100 mg total) by mouth 2 (two) times daily.   fenofibrate micronized (LOFIBRA) 134 MG capsule Take 134 mg by mouth  daily before breakfast.   fluticasone (FLONASE) 50 MCG/ACT nasal spray Place 1 spray into both nostrils daily.   Glucos-Chond-Hyal Ac-Ca Fructo (MOVE FREE JOINT HEALTH ADVANCE PO) Take 1 tablet by mouth daily.   Magnesium 250 MG TABS Take 250 mg by mouth 3 (three) times daily.    Melatonin 10 MG CAPS Take by mouth.   Multiple Vitamin (MULTIVITAMIN) tablet Take 1 tablet by mouth daily.   nitrofurantoin, macrocrystal-monohydrate, (MACROBID) 100 MG capsule Take 1 capsule (100 mg total) by mouth 2 (two) times daily.   Omega-3 Fatty Acids (FISH OIL) 1000 MG CAPS Take 1,000 mg by mouth 3 (three) times daily. Takes 1-3 times daily   polyethylene glycol (MIRALAX / GLYCOLAX) 17 g packet Take 17 g by mouth daily as needed.   Probiotic Product (PROBIOTIC DAILY PO) Take by mouth.   sertraline (ZOLOFT) 100 MG tablet Take 1 tablet (100 mg total) by mouth daily. (Patient taking differently: Take 100 mg by mouth daily.)   sodium chloride (OCEAN) 0.65 % SOLN nasal spray Place 1 spray into both nostrils 2 (two) times daily as needed for congestion.   tetrahydrozoline 0.05 % ophthalmic solution Place 1 drop into both eyes daily as needed (dry eyes).   traZODone (DESYREL) 100 MG tablet Take 100 mg by mouth at bedtime.   Vitamin D, Ergocalciferol, (DRISDOL) 50000 units CAPS capsule Take 1 capsule (50,000 Units total) by mouth See admin instructions. Takes every other week on Friday   vitamin E 400 UNIT capsule Take 400 Units by mouth 3 (three) times daily.    zinc gluconate 50 MG tablet Take 50 mg by mouth daily.   No facility-administered encounter medications on file as of 08/05/2022.     There were no vitals filed for this visit. There is no height or weight on file to calculate BMI.   PHYSICAL EXAM GENERAL:alert, no distress and comfortable SKIN: no rash  EYES: sclera clear NECK: without mass LYMPH:  no palpable cervical or supraclavicular lymphadenopathy  LUNGS: clear with normal breathing  effort HEART: regular rate & rhythm, no lower extremity edema ABDOMEN: abdomen soft, non-tender and normal bowel sounds NEURO: alert & oriented x 3 with fluent speech, no focal motor/sensory deficits Breast exam:  PAC without erythema    CBC    Component Value Date/Time   WBC 3.4 (L) 07/30/2021 0920   RBC 3.83 (L) 07/30/2021 0920   HGB 11.6 (L) 07/30/2021 0920   HGB 14.1 06/28/2014 1320   HCT 34.2 (L) 07/30/2021 0920   HCT 30.3 (L) 11/30/2017 0956   PLT 222 07/30/2021 0920   MCV 89.3 07/30/2021 0920   MCH 30.3 07/30/2021 0920   MCHC 33.9 07/30/2021 0920   RDW 12.1 07/30/2021 0920   LYMPHSABS 1.1  07/30/2021 0920   MONOABS 0.3 07/30/2021 0920   EOSABS 0.2 07/30/2021 0920   BASOSABS 0.0 07/30/2021 0920     CMP     Component Value Date/Time   NA 141 07/30/2021 0920   K 4.0 07/30/2021 0920   CL 106 07/30/2021 0920   CO2 28 07/30/2021 0920   GLUCOSE 108 (H) 07/30/2021 0920   BUN 19 07/30/2021 0920   CREATININE 0.78 07/30/2021 0920   CREATININE 0.66 09/19/2015 0957   CALCIUM 9.6 07/30/2021 0920   PROT 6.7 07/30/2021 0920   ALBUMIN 4.5 07/30/2021 0920   AST 14 (L) 07/30/2021 0920   ALT 13 07/30/2021 0920   ALKPHOS 36 (L) 07/30/2021 0920   BILITOT 0.4 07/30/2021 0920   GFRNONAA >60 07/30/2021 0920   GFRAA >60 12/31/2019 1052     ASSESSMENT & PLAN:Dawn West is a 72 y.o. female with      1. Breast cancer of upper outer quadrant of right breast, invasive ductal carcinoma, pT1aN0M0, stage IA, ER-/PR-/HER2+, and high grade DCIS ER-/PR- -Diagnosed in 05/2016. S/p right breast lumpectomy, 2 breast re-excision surgeries, and adjuvant RT.  -she began close surveillance given ER/PR negative and HER-2 positive disease is more aggressive -Mammogram 10/13/21 negative - continue breast cancer surveillance    2. Iron deficient Anemia with thrombocytosis -Based on Epic records, she has been mildly anemia since early 2018.  -Per pt she had a colonoscopy in 2019 which was  unremarkable. Anemia workup showed iron deficiency and otherwise negative.  -She started OTC oral ferrous sulfate 1-2 times daily in summer 2019. Anemia and thrombocytosis resolved on 02/2018 labs.  -She developed recurrent IDA, normal plt; she is followed by Dr. Collene Mares for reported upper GI ulcer -takes oral iron   3. Left lumbar pain with sciatica, b/l hip and knee pains -lumbar pain onset 01/15/21 after working in the yard. She was in severe pain while in [place] helping her daughter with childcare while she recovered from surgery -managed with tylenol TID and topical muscle relaxe -Xray showed degenerative changes and arthritis -she has arthritic pains, worse in cold weather; resolves with Aspercreme    4. Osteopenia  -Her 05/2018 DEXA shows osteopenia with lowest T-score of -1.6 at left hip. -DEXA 01/07/2021 showed osteopenia at the left femur neck T score -1.8 with FRAX score 11% risk of major osteoporotic fracture and 2% risk of hip fracture -Continue Calcium and VitD and remain active with weight bearing exercise. Given her 2 knee replacement her orthopedist did not recommend walking.  -Repeat 12/2022    PLAN:  No orders of the defined types were placed in this encounter.     All questions were answered. The patient knows to call the clinic with any problems, questions or concerns. No barriers to learning were detected. I spent *** counseling the patient face to face. The total time spent in the appointment was *** and more than 50% was on counseling, review of test results, and coordination of care.   Cira Rue, NP-C @DATE @

## 2022-08-05 ENCOUNTER — Encounter: Payer: Self-pay | Admitting: Nurse Practitioner

## 2022-08-05 ENCOUNTER — Inpatient Hospital Stay: Payer: Medicare Other | Attending: Nurse Practitioner

## 2022-08-05 ENCOUNTER — Inpatient Hospital Stay (HOSPITAL_BASED_OUTPATIENT_CLINIC_OR_DEPARTMENT_OTHER): Payer: Medicare Other | Admitting: Nurse Practitioner

## 2022-08-05 ENCOUNTER — Other Ambulatory Visit: Payer: Self-pay

## 2022-08-05 VITALS — BP 139/63 | HR 63 | Temp 98.0°F | Resp 17 | Wt 139.3 lb

## 2022-08-05 DIAGNOSIS — Z1231 Encounter for screening mammogram for malignant neoplasm of breast: Secondary | ICD-10-CM | POA: Diagnosis not present

## 2022-08-05 DIAGNOSIS — M25562 Pain in left knee: Secondary | ICD-10-CM | POA: Diagnosis not present

## 2022-08-05 DIAGNOSIS — Z96653 Presence of artificial knee joint, bilateral: Secondary | ICD-10-CM | POA: Insufficient documentation

## 2022-08-05 DIAGNOSIS — M545 Low back pain, unspecified: Secondary | ICD-10-CM | POA: Insufficient documentation

## 2022-08-05 DIAGNOSIS — M85852 Other specified disorders of bone density and structure, left thigh: Secondary | ICD-10-CM | POA: Diagnosis not present

## 2022-08-05 DIAGNOSIS — D649 Anemia, unspecified: Secondary | ICD-10-CM

## 2022-08-05 DIAGNOSIS — Z171 Estrogen receptor negative status [ER-]: Secondary | ICD-10-CM

## 2022-08-05 DIAGNOSIS — C50411 Malignant neoplasm of upper-outer quadrant of right female breast: Secondary | ICD-10-CM

## 2022-08-05 DIAGNOSIS — D75839 Thrombocytosis, unspecified: Secondary | ICD-10-CM | POA: Diagnosis not present

## 2022-08-05 DIAGNOSIS — D509 Iron deficiency anemia, unspecified: Secondary | ICD-10-CM | POA: Insufficient documentation

## 2022-08-05 DIAGNOSIS — Z853 Personal history of malignant neoplasm of breast: Secondary | ICD-10-CM | POA: Insufficient documentation

## 2022-08-05 DIAGNOSIS — Z79899 Other long term (current) drug therapy: Secondary | ICD-10-CM | POA: Insufficient documentation

## 2022-08-05 LAB — FERRITIN: Ferritin: 294 ng/mL (ref 11–307)

## 2022-08-05 LAB — IRON AND IRON BINDING CAPACITY (CC-WL,HP ONLY)
Iron: 104 ug/dL (ref 28–170)
Saturation Ratios: 18 % (ref 10.4–31.8)
TIBC: 566 ug/dL — ABNORMAL HIGH (ref 250–450)
UIBC: 462 ug/dL — ABNORMAL HIGH (ref 148–442)

## 2022-08-05 LAB — CBC WITH DIFFERENTIAL (CANCER CENTER ONLY)
Abs Immature Granulocytes: 0.01 10*3/uL (ref 0.00–0.07)
Basophils Absolute: 0 10*3/uL (ref 0.0–0.1)
Basophils Relative: 1 %
Eosinophils Absolute: 0.1 10*3/uL (ref 0.0–0.5)
Eosinophils Relative: 4 %
HCT: 38.6 % (ref 36.0–46.0)
Hemoglobin: 13.1 g/dL (ref 12.0–15.0)
Immature Granulocytes: 0 %
Lymphocytes Relative: 42 %
Lymphs Abs: 1.5 10*3/uL (ref 0.7–4.0)
MCH: 30.5 pg (ref 26.0–34.0)
MCHC: 33.9 g/dL (ref 30.0–36.0)
MCV: 90 fL (ref 80.0–100.0)
Monocytes Absolute: 0.4 10*3/uL (ref 0.1–1.0)
Monocytes Relative: 10 %
Neutro Abs: 1.6 10*3/uL — ABNORMAL LOW (ref 1.7–7.7)
Neutrophils Relative %: 43 %
Platelet Count: 275 10*3/uL (ref 150–400)
RBC: 4.29 MIL/uL (ref 3.87–5.11)
RDW: 11.8 % (ref 11.5–15.5)
WBC Count: 3.7 10*3/uL — ABNORMAL LOW (ref 4.0–10.5)
nRBC: 0 % (ref 0.0–0.2)

## 2022-08-05 LAB — CMP (CANCER CENTER ONLY)
ALT: 13 U/L (ref 0–44)
AST: 17 U/L (ref 15–41)
Albumin: 4.7 g/dL (ref 3.5–5.0)
Alkaline Phosphatase: 37 U/L — ABNORMAL LOW (ref 38–126)
Anion gap: 8 (ref 5–15)
BUN: 25 mg/dL — ABNORMAL HIGH (ref 8–23)
CO2: 28 mmol/L (ref 22–32)
Calcium: 9.6 mg/dL (ref 8.9–10.3)
Chloride: 104 mmol/L (ref 98–111)
Creatinine: 0.93 mg/dL (ref 0.44–1.00)
GFR, Estimated: >60 mL/min (ref >60)
Glucose, Bld: 101 mg/dL — ABNORMAL HIGH (ref 70–99)
Potassium: 3.9 mmol/L (ref 3.5–5.1)
Sodium: 140 mmol/L (ref 135–145)
Total Bilirubin: 0.4 mg/dL (ref 0.3–1.2)
Total Protein: 7.2 g/dL (ref 6.5–8.1)

## 2022-08-09 ENCOUNTER — Telehealth: Payer: Self-pay

## 2022-08-09 NOTE — Telephone Encounter (Addendum)
Patient called and I relayed the message below. Patient voiced a full understanding.    ----- Message from Alla Feeling, NP sent at 08/06/2022  8:26 AM EDT ----- Please let pt know labs, she is not anemic, but WBC slightly low and some iron levels are out of normal range. I recommend to take a prenatal vitamin once daily.   Thanks, Regan Rakers NP

## 2022-08-13 ENCOUNTER — Ambulatory Visit (HOSPITAL_COMMUNITY)
Admission: RE | Admit: 2022-08-13 | Discharge: 2022-08-13 | Disposition: A | Discharge status: Home / Self Care | Payer: Medicare Other | Source: Ambulatory Visit | Attending: Nurse Practitioner | Admitting: Nurse Practitioner

## 2022-08-13 DIAGNOSIS — Z853 Personal history of malignant neoplasm of breast: Secondary | ICD-10-CM | POA: Diagnosis not present

## 2022-08-13 DIAGNOSIS — C50411 Malignant neoplasm of upper-outer quadrant of right female breast: Secondary | ICD-10-CM | POA: Diagnosis not present

## 2022-08-13 DIAGNOSIS — Z171 Estrogen receptor negative status [ER-]: Secondary | ICD-10-CM | POA: Insufficient documentation

## 2022-08-17 ENCOUNTER — Telehealth: Payer: Self-pay | Admitting: *Deleted

## 2022-08-17 NOTE — Telephone Encounter (Signed)
-----   Message from Pollyann Samples, NP sent at 08/17/2022 12:45 PM EDT ----- Please let pt know abdominal US is unremarkable, liver is normal, no mass or duct dilatation. Please have her follow up with PCP for symptoms.   Thanks, Clayborn Heron NP

## 2022-08-17 NOTE — Telephone Encounter (Signed)
PC to patient, no answer, left VM - informed her of U/S results as below per Theodis Blaze NP.  Instructed patient to call back with any questions/concerns, [phone].

## 2022-08-24 ENCOUNTER — Other Ambulatory Visit: Payer: Self-pay | Admitting: *Deleted

## 2022-08-24 ENCOUNTER — Telehealth: Payer: Self-pay | Admitting: *Deleted

## 2022-08-24 DIAGNOSIS — Z171 Estrogen receptor negative status [ER-]: Secondary | ICD-10-CM

## 2022-08-24 NOTE — Telephone Encounter (Signed)
Pt called requesting DEXA orders to be placed. Orders were placed and pt called and left voicemail. Advised to call with concerns.

## 2022-09-14 DIAGNOSIS — J309 Allergic rhinitis, unspecified: Secondary | ICD-10-CM | POA: Diagnosis not present

## 2022-09-14 DIAGNOSIS — Z0000 Encounter for general adult medical examination without abnormal findings: Secondary | ICD-10-CM | POA: Diagnosis not present

## 2022-09-14 DIAGNOSIS — Z1211 Encounter for screening for malignant neoplasm of colon: Secondary | ICD-10-CM | POA: Diagnosis not present

## 2022-09-14 DIAGNOSIS — I10 Essential (primary) hypertension: Secondary | ICD-10-CM | POA: Diagnosis not present

## 2022-09-14 DIAGNOSIS — F419 Anxiety disorder, unspecified: Secondary | ICD-10-CM | POA: Diagnosis not present

## 2022-09-14 DIAGNOSIS — R600 Localized edema: Secondary | ICD-10-CM | POA: Diagnosis not present

## 2022-09-14 DIAGNOSIS — E559 Vitamin D deficiency, unspecified: Secondary | ICD-10-CM | POA: Diagnosis not present

## 2022-09-14 DIAGNOSIS — D171 Benign lipomatous neoplasm of skin and subcutaneous tissue of trunk: Secondary | ICD-10-CM | POA: Diagnosis not present

## 2022-09-14 DIAGNOSIS — E785 Hyperlipidemia, unspecified: Secondary | ICD-10-CM | POA: Diagnosis not present

## 2022-09-14 DIAGNOSIS — Z1239 Encounter for other screening for malignant neoplasm of breast: Secondary | ICD-10-CM | POA: Diagnosis not present

## 2022-10-12 DIAGNOSIS — H0288B Meibomian gland dysfunction left eye, upper and lower eyelids: Secondary | ICD-10-CM | POA: Diagnosis not present

## 2022-10-12 DIAGNOSIS — H16223 Keratoconjunctivitis sicca, not specified as Sjogren's, bilateral: Secondary | ICD-10-CM | POA: Diagnosis not present

## 2022-10-12 DIAGNOSIS — H0288A Meibomian gland dysfunction right eye, upper and lower eyelids: Secondary | ICD-10-CM | POA: Diagnosis not present

## 2022-10-15 ENCOUNTER — Ambulatory Visit
Admission: RE | Admit: 2022-10-15 | Discharge: 2022-10-15 | Disposition: A | Discharge status: Home / Self Care | Payer: Medicare Other | Source: Ambulatory Visit | Attending: Nurse Practitioner | Admitting: Nurse Practitioner

## 2022-10-15 DIAGNOSIS — Z1231 Encounter for screening mammogram for malignant neoplasm of breast: Secondary | ICD-10-CM | POA: Diagnosis not present

## 2022-10-25 DIAGNOSIS — D122 Benign neoplasm of ascending colon: Secondary | ICD-10-CM | POA: Diagnosis not present

## 2022-10-25 DIAGNOSIS — K5520 Angiodysplasia of colon without hemorrhage: Secondary | ICD-10-CM | POA: Diagnosis not present

## 2022-10-25 DIAGNOSIS — K635 Polyp of colon: Secondary | ICD-10-CM | POA: Diagnosis not present

## 2022-10-25 DIAGNOSIS — K5730 Diverticulosis of large intestine without perforation or abscess without bleeding: Secondary | ICD-10-CM | POA: Diagnosis not present

## 2022-10-25 DIAGNOSIS — D120 Benign neoplasm of cecum: Secondary | ICD-10-CM | POA: Diagnosis not present

## 2022-10-25 DIAGNOSIS — Z86010 Personal history of colonic polyps: Secondary | ICD-10-CM | POA: Diagnosis not present

## 2022-10-25 DIAGNOSIS — Z1211 Encounter for screening for malignant neoplasm of colon: Secondary | ICD-10-CM | POA: Diagnosis not present

## 2023-01-24 DIAGNOSIS — L821 Other seborrheic keratosis: Secondary | ICD-10-CM | POA: Diagnosis not present

## 2023-01-24 DIAGNOSIS — L918 Other hypertrophic disorders of the skin: Secondary | ICD-10-CM | POA: Diagnosis not present

## 2023-01-24 DIAGNOSIS — L720 Epidermal cyst: Secondary | ICD-10-CM | POA: Diagnosis not present

## 2023-01-24 DIAGNOSIS — L814 Other melanin hyperpigmentation: Secondary | ICD-10-CM | POA: Diagnosis not present

## 2023-01-24 DIAGNOSIS — D1801 Hemangioma of skin and subcutaneous tissue: Secondary | ICD-10-CM | POA: Diagnosis not present

## 2023-01-24 DIAGNOSIS — D225 Melanocytic nevi of trunk: Secondary | ICD-10-CM | POA: Diagnosis not present

## 2023-01-24 DIAGNOSIS — Z85828 Personal history of other malignant neoplasm of skin: Secondary | ICD-10-CM | POA: Diagnosis not present

## 2023-02-14 DIAGNOSIS — Z23 Encounter for immunization: Secondary | ICD-10-CM | POA: Diagnosis not present

## 2023-02-22 DIAGNOSIS — H9313 Tinnitus, bilateral: Secondary | ICD-10-CM | POA: Diagnosis not present

## 2023-02-22 DIAGNOSIS — H903 Sensorineural hearing loss, bilateral: Secondary | ICD-10-CM | POA: Diagnosis not present

## 2023-02-22 DIAGNOSIS — Z77122 Contact with and (suspected) exposure to noise: Secondary | ICD-10-CM | POA: Diagnosis not present

## 2023-03-01 DIAGNOSIS — H25011 Cortical age-related cataract, right eye: Secondary | ICD-10-CM | POA: Diagnosis not present

## 2023-03-01 DIAGNOSIS — H2513 Age-related nuclear cataract, bilateral: Secondary | ICD-10-CM | POA: Diagnosis not present

## 2023-03-01 DIAGNOSIS — H16223 Keratoconjunctivitis sicca, not specified as Sjogren's, bilateral: Secondary | ICD-10-CM | POA: Diagnosis not present

## 2023-03-11 ENCOUNTER — Ambulatory Visit
Admission: RE | Admit: 2023-03-11 | Discharge: 2023-03-11 | Disposition: A | Discharge status: Home / Self Care | Payer: Medicare Other | Source: Ambulatory Visit | Attending: Nurse Practitioner | Admitting: Nurse Practitioner

## 2023-03-11 DIAGNOSIS — M8588 Other specified disorders of bone density and structure, other site: Secondary | ICD-10-CM | POA: Diagnosis not present

## 2023-03-11 DIAGNOSIS — Z171 Estrogen receptor negative status [ER-]: Secondary | ICD-10-CM

## 2023-03-11 DIAGNOSIS — Z853 Personal history of malignant neoplasm of breast: Secondary | ICD-10-CM | POA: Diagnosis not present

## 2023-03-11 DIAGNOSIS — E2839 Other primary ovarian failure: Secondary | ICD-10-CM | POA: Diagnosis not present

## 2023-03-11 DIAGNOSIS — N958 Other specified menopausal and perimenopausal disorders: Secondary | ICD-10-CM | POA: Diagnosis not present

## 2023-03-14 ENCOUNTER — Telehealth: Payer: Self-pay | Admitting: Nurse Practitioner

## 2023-03-14 ENCOUNTER — Telehealth: Payer: Self-pay

## 2023-03-14 NOTE — Telephone Encounter (Signed)
-----   Message from Pollyann Samples sent at 03/14/2023  8:13 AM EST ----- Please let pt know her bone density has worsened slightly over the past 3 scans, I've asked the schedule to add phone f/up with me Wednesday morning.   Thanks Lacie NP

## 2023-03-14 NOTE — Telephone Encounter (Signed)
Pt advised with VU and an appt has been scheduled for Wed morning at 10:00

## 2023-03-16 ENCOUNTER — Inpatient Hospital Stay: Payer: Medicare Other | Attending: Nurse Practitioner | Admitting: Nurse Practitioner

## 2023-03-16 ENCOUNTER — Encounter: Payer: Self-pay | Admitting: Nurse Practitioner

## 2023-03-16 DIAGNOSIS — M85852 Other specified disorders of bone density and structure, left thigh: Secondary | ICD-10-CM | POA: Diagnosis not present

## 2023-03-16 MED ORDER — ALENDRONATE SODIUM 70 MG PO TABS: 70.0000 mg | ORAL_TABLET | ORAL | 3 refills | Status: DC

## 2023-03-16 NOTE — Progress Notes (Signed)
Patient Care Team: Verl Blalock as PCP - General (Family Medicine) Ria Comment, FNP as Nurse Practitioner (Family Medicine) Almond Lint, MD as Consulting Physician (General Surgery) Malachy Mood, MD as Consulting Physician (Hematology) Antony Blackbird, MD as Consulting Physician (Radiation Oncology)   I connected with Dawn West on 03/16/23 at 10:00 AM EST by telephone visit and verified that I am speaking with the correct person using two identifiers.   I discussed the limitations, risks, security and privacy concerns of performing an evaluation and management service by telemedicine and the availability of in-person appointments. I also discussed with the patient that there may be a patient responsible charge related to this service. The patient expressed understanding and agreed to proceed.   Other persons participating in the visit and their role in the encounter: None   Patient's location: Home  Provider's location: [facility] office   CHIEF COMPLAINT: Follow up DEXA results  Oncology History Overview Note  Cancer Staging Breast cancer of upper-outer quadrant of right female breast (HCC) Staging form: Breast, AJCC 8th Edition - Clinical: Stage 0 (cTis (DCIS), cN0, cM0, ER: Negative, PR: Negative) - Signed by Malachy Mood, MD on 05/25/2016 - Pathologic stage from 06/02/2016: Stage IA (pT1a, pN0, cM0, G2, ER: Negative, PR: Negative, HER2: Positive) - Signed by Malachy Mood, MD on 11/29/2016     Breast cancer of upper-outer quadrant of right female breast (HCC)  05/18/2016 Mammogram   Diagnostic mammogram of the right breast showed heterogeneous calcifications in a linear orientation within the posterior upper right breast and spanning a distance of 2.2 cm. No associated mass identified.   05/19/2016 Initial Biopsy   Right breast core needle biopsy showed ductal carcinoma with calcification, high-grade, focal highly suspicious for stromal invasion.   05/19/2016 Receptors her2    ER negative, PR negative   05/19/2016 Initial Diagnosis   Ductal carcinoma in situ (DCIS) of right breast   06/02/2016 Surgery   RIGHT BREAST LUMPECTOMY WITH RADIOACTIVE SEED AND SENTINEL LYMPH NODE BIOPSY by Dr. Donell Beers   06/02/2016 Pathology Results   Diagnosis 06/02/16 1. Breast, lumpectomy, Right w/seed - INVASIVE DUCTAL CARCINOMA, GRADE 2, SPANNING 0.3 CM. - HIGH GRADE DUCTAL CARCINOMA IN SITU WITH NECROSIS. - INVASIVE CARCINOMA COMES TO WITHIN <0.1 CM OF THE POSTERIOR MARGIN FOCALLY. - DUCTAL CARCINOMA IN SITU COMES TO WITHIN <0.1 CM OF THE POSTERIOR MARGIN FOCALLY AND THE SUPERIOR MARGIN FOCALLY. - BIOPSY SITE. - SEE ONCOLOGY TABLE. 2. Breast, excision, Right additional Medial Margin - FIBROCYSTIC CHANGE. - USUAL DUCTAL HYPERPLASIA. - NO MALIGNANCY IDENTIFIED. 3. Lymph node, sentinel, biopsy, Right Axillary #1 - ONE OF ONE LYMPH NODES NEGATIVE FOR CARCINOMA (0/1). 4. Lymph node, sentinel, biopsy, Right Axillary - ONE OF ONE LYMPH NODES NEGATIVE FOR CARCINOMA (0/1).   06/02/2016 Receptors her2   ER-, PR-, HER2+    07/26/2016 Surgery   RE-EXCISION OF RIGHT BREAST LUMPECTOMY by Dr. Donell Beers   07/26/2016 Pathology Results   Diagnosis 07/26/16 Breast, excision, Right new Superior Margin - DUCTAL CARCINOMA IN SITU WITH CALCIFICATIONS, HIGH GRADE, SPANNING AT LEAST 1.5 CM. - FOCI HIGHLY SUSPICIOUS FOR EARLY STROMAL INVASION. - DUCTAL CARCINOMA IN SITU IS BROADLY LESS THAN 0.1 CM TO THE NEW SUPERIOR MARGIN. - SEE COMMENT.   08/26/2016 Pathology Results   Diagnosis 08/26/16 Breast, right, needle core biopsy, superior anterior - HIGH GRADE, DUCTAL CARCINOMA IN SITU - SEE COMMENT   09/06/2016 Surgery   RE-EXCISION OF RIGHT BREAST LUMPECTOMY WITH RADIOACTIVE SEED LOCALIZATION by Dr. Donell Beers  09/06/2016 Pathology Results   Diagnosis 09/06/16 1. Breast, excision, Right Superior Margin - INVASIVE DUCTAL CARCINOMA, GRADE III/III, SCATTERED MICROSCOPIC FOCI. - DUCTAL CARCINOMA IN SITU  WITH CALCIFICATIONS, HIGH GRADE. - INVASIVE CARCINOMA IS FOCALLY LESS THAN 0.1 CM TO THE INFERIOR MARGIN OF SPECIMEN #1.. - DUCTAL CARCINOMA IN SITU IS BROADLY LESS THAN 0.1 CM TO THE LATERAL MARGIN, FOCALLY LESS THAN 0.1 CM TO THE INFERIOR MARGIN, AND FOCALLY LESS THAN 0.1 CM TO THE ANTERIOR MARGIN OF SPECIMEN #1. - SEE COMMENT. 2. Breast, excision, Right Superolateral Margin - FIBROCYSTIC CHANGES. - THERE IS NO EVIDENCE OF MALIGNANCY. - SEE COMMENT.   10/13/2016 - 11/30/2016 Radiation Therapy   Radiation by Dr. Roselind Messier  Radiation treatment dates:   10/13/16 - 11/30/16 Site/dose:    1) Right Breast treated to 50.4 Gy in 28 fractions 2) Right Breast Boosted an additional 12 Gy in 6 fractions             Beams/energy:    1) 3D  //  6X 2) 3D  //  6X Narrative: The patient tolerated radiation treatment relatively well. The patient experienced occasional mild pain in her right breast. She also reported some fatigue and appetite reduction throughout treatment, though this did not affect her activities of daily living. She experienced some radiation related skin changes including moderate erythema across the right breast.    05/31/2017 Mammogram   Diagnostic Mammogram 05/31/17  IMPRESSION: No mammographic evidence of malignancy. RECOMMENDATION: Annual diagnostic mammography.      CURRENT THERAPY: Breast cancer surveillance   INTERVAL HISTORY Ms. Dawn West presents for phone follow-up as scheduled to review DEXA results.  She takes calcium and vitamin D and finds ways to exercise, including yard work, stationary bike, and walking stairs.  She strained her right arm playing air hockey with her grandkids in July, pain eventually resolved in October but now it "pops."  She has lower right buttock pain since July which is worse with activity, has to sit on a heating pad, and feels the sciatic nerve flare at times.  IcyHot helps as well as wearing a device that straps on with a magnetic component that makes  the nerve pain go away immediately.  ROS  All other systems reviewed and negative  Past Medical History:  Diagnosis Date   Anxiety    Arthritis    knees   Breast cancer (HCC) 2018   Right Breast   Cancer (HCC) 12/01/11   basal cell skin ca Left upper arm   Cancer (HCC) 04/20/2016   right breast cancer   Complication of anesthesia    Depression    Headache    History of radiation therapy 10/13/16-11/30/16   right breast 50.4 Gy in 28 fractions, right breast boost 12 Gy in 6 fractions   Hypercholesteremia    Personal history of radiation therapy 2018   Right Breast Cancer   PONV (postoperative nausea and vomiting)      Past Surgical History:  Procedure Laterality Date   ABDOMINAL HYSTERECTOMY     BREAST LUMPECTOMY Right 2018   BREAST LUMPECTOMY WITH RADIOACTIVE SEED AND SENTINEL LYMPH NODE BIOPSY Right 06/02/2016   Procedure: RIGHT BREAST LUMPECTOMY WITH RADIOACTIVE SEED AND SENTINEL LYMPH NODE BIOPSY;  Surgeon: Almond Lint, MD;  Location: [facility];  Service: General;  Laterality: Right;   BREAST LUMPECTOMY WITH RADIOACTIVE SEED LOCALIZATION Right 09/06/2016   Procedure: RE-EXCISION OF RIGHT BREAST LUMPECTOMY WITH RADIOACTIVE SEED LOCALIZATION;  Surgeon: Almond Lint, MD;  Location: [facility];  Service: General;  Laterality: Right;   BREAST SURGERY  05/19/2016   right breast biopsy   CHOLECYSTECTOMY N/A 06/13/2019   Procedure: LAPAROSCOPIC CHOLECYSTECTOMY;  Surgeon: Abigail Miyamoto, MD;  Location: [facility];  Service: General;  Laterality: N/A;   KNEE ARTHROPLASTY Left 06/10/2016   Procedure: LEFT TOTAL KNEE ARTHROPLASTY WITH COMPUTER NAVIGATION;  Surgeon: Samson Frederic, MD;  Location: [facility];  Service: Orthopedics;  Laterality: Left;   KNEE ARTHROPLASTY Right 01/20/2017   Procedure: RIGHT TOTAL KNEE ARTHROPLASTY WITH COMPUTER NAVIGATION;  Surgeon: Samson Frederic, MD;  Location: [facility];  Service: Orthopedics;  Laterality: Right;   Adductor Block   MOUTH SURGERY     RE-EXCISION OF BREAST LUMPECTOMY Right 07/26/2016   Procedure: RE-EXCISION OF RIGHT BREAST LUMPECTOMY;  Surgeon: Almond Lint, MD;  Location: [facility];  Service: General;  Laterality: Right;   SKIN CANCER EXCISION Left 2013   upper arm, basal cell   TONSILLECTOMY AND ADENOIDECTOMY     (DEVIATED SEPTUM  SAME TIME)   TOTAL VAGINAL HYSTERECTOMY  12/1996   menorrhagia, fibroids     Outpatient Encounter Medications as of 03/16/2023  Medication Sig   acetaminophen (TYLENOL) 500 MG tablet Take 500 mg by mouth every 6 (six) hours as needed.   Calcium Carb-Cholecalciferol (CALCIUM 600 + D) 600-200 MG-UNIT TABS Take 1 tablet by mouth 2 (two) times daily.    docusate sodium (COLACE) 100 MG capsule Take 1 capsule (100 mg total) by mouth 2 (two) times daily.   fenofibrate micronized (LOFIBRA) 134 MG capsule Take 134 mg by mouth daily before breakfast.   fluticasone (FLONASE) 50 MCG/ACT nasal spray Place 1 spray into both nostrils daily.   Glucos-Chond-Hyal Ac-Ca Fructo (MOVE FREE JOINT HEALTH ADVANCE PO) Take 1 tablet by mouth daily.   Magnesium 250 MG TABS Take 250 mg by mouth 3 (three) times daily.    Melatonin 10 MG CAPS Take by mouth.   nitrofurantoin, macrocrystal-monohydrate, (MACROBID) 100 MG capsule Take 1 capsule (100 mg total) by mouth 2 (two) times daily. (Patient not taking: Reported on 08/05/2022)   Omega-3 Fatty Acids (FISH OIL) 1000 MG CAPS Take 1,000 mg by mouth 3 (three) times daily. Takes 1-3 times daily   polyethylene glycol (MIRALAX / GLYCOLAX) 17 g packet Take 17 g by mouth daily as needed.   Probiotic Product (PROBIOTIC DAILY PO) Take by mouth.   sertraline (ZOLOFT) 100 MG tablet Take 1 tablet (100 mg total) by mouth daily. (Patient taking differently: Take 100 mg by mouth daily.)   sodium chloride (OCEAN) 0.65 % SOLN nasal spray Place 1 spray into both nostrils 2 (two) times daily as needed for congestion.   tetrahydrozoline 0.05 % ophthalmic solution  Place 1 drop into both eyes daily as needed (dry eyes).   traZODone (DESYREL) 100 MG tablet Take 100 mg by mouth at bedtime.   Vitamin D, Ergocalciferol, (DRISDOL) 50000 units CAPS capsule Take 1 capsule (50,000 Units total) by mouth See admin instructions. Takes every other week on Friday   vitamin E 400 UNIT capsule Take 400 Units by mouth 3 (three) times daily.    zinc gluconate 50 MG tablet Take 50 mg by mouth daily.   No facility-administered encounter medications on file as of 03/16/2023.     There were no vitals filed for this visit. There is no height or weight on file to calculate BMI.    PHYSICAL EXAM Patient appears well by phone.  Voice is strong, speech is clear.  Mood/affect appear normal.  No cough or conversational dyspnea  CBC    Component Value Date/Time   WBC 3.7 (L) 08/05/2022 0923   WBC 3.4 (L) 07/30/2021 0920   RBC 4.29 08/05/2022 0923   HGB 13.1 08/05/2022 0923   HGB 14.1 06/28/2014 1320   HCT 38.6 08/05/2022 0923   HCT 30.3 (L) 11/30/2017 0956   PLT 275 08/05/2022 0923   MCV 90.0 08/05/2022 0923   MCH 30.5 08/05/2022 0923   MCHC 33.9 08/05/2022 0923   RDW 11.8 08/05/2022 0923   LYMPHSABS 1.5 08/05/2022 0923   MONOABS 0.4 08/05/2022 0923   EOSABS 0.1 08/05/2022 0923   BASOSABS 0.0 08/05/2022 0923     CMP     Component Value Date/Time   NA 140 08/05/2022 0923   K 3.9 08/05/2022 0923   CL 104 08/05/2022 0923   CO2 28 08/05/2022 0923   GLUCOSE 101 (H) 08/05/2022 0923   BUN 25 (H) 08/05/2022 0923   CREATININE 0.93 08/05/2022 0923   CREATININE 0.66 09/19/2015 0957   CALCIUM 9.6 08/05/2022 0923   PROT 7.2 08/05/2022 0923   ALBUMIN 4.7 08/05/2022 0923   AST 17 08/05/2022 0923   ALT 13 08/05/2022 0923   ALKPHOS 37 (L) 08/05/2022 0923   BILITOT 0.4 08/05/2022 0923   GFRNONAA >60 08/05/2022 0923   GFRAA >60 12/31/2019 1052     ASSESSMENT & PLAN:Kazia Tristian Bouska is a 71 y.o. female with     Osteopenia  -Her 05/2018 DEXA shows osteopenia with  lowest T-score of -1.6 at left hip. -DEXA 01/07/2021 showed osteopenia at the left femur neck T score -1.8 with FRAX score 11% risk of major osteoporotic fracture and 2% risk of hip fracture -Continue Calcium and VitD and remain active with weight bearing exercise. Given her 2 knee replacement her orthopedist did not recommend walking.  -Repeat DEXA 03/11/2023 shows slightly worse osteopenia T-score -2.0, with increased fracture risk. -Hip fracture risk up to 2.9% in 10 years, with a 12.9% risk of major osteoporotic fracture in 10 years.  This has been gradually increasing since DEXA's in 2020 -We reviewed treatment strategies including oral, injection, and IV bisphosphonates as well as the potential risk and benefit and side effects. -We discussed the potential benefit of reducing risk of bone metastasis from breast cancer with Zometa -After lengthy discussion she prefers to try Fosamax first, I sent to her pharmacy   Breast cancer of upper outer quadrant of right breast, invasive ductal carcinoma, pT1aN0M0, stage IA, ER-/PR-/HER2+, and high grade DCIS ER-/PR- -Diagnosed in 05/2016. S/p right breast lumpectomy, 2 breast re-excision surgeries, and adjuvant RT.  -she began close surveillance given ER/PR negative and HER-2 positive disease is more aggressive -Mammogram 10/15/2022 was benign  -Routine follow-up 07/2023 as scheduled  Back pain with sciatica, b/l hip and knee pains -lumbar pain onset 01/15/21 after working in the yard. She was in severe pain while in [place] helping her daughter with childcare while she recovered from surgery -managed with tylenol TID and topical muscle relaxe -Xray showed degenerative changes and arthritis -she has arthritic pains, worse in cold weather and with activity; resolves with Aspercreme and heat -She has had some various aches and pains including sciatica lately, related to activity.  She is managing mostly well at home.   -She plans to follow-up with Ortho which I  agree   PLAN: -DEXA and management reviewed -Rx for Fosamax po weekly, sent to pharmacy -F/up Ortho for arthralgia and sciatica -Breast cancer surveillance f/up  07/2023 as scheduled, or sooner if needed   I discussed the assessment and treatment plan with the patient. The patient was provided an opportunity to ask questions and all were answered. The patient agreed with the plan and demonstrated an understanding of the instructions.   The patient was advised to call back or seek an in-person evaluation if the symptoms worsen or if the condition fails to improve as anticipated.  No barriers to learning were detected.  The total time spent in the appointment was 15 minutes and more than 50% was on counseling, review of test results, and coordination of care.   Santiago Glad, NP-C 03/16/2023

## 2023-03-21 DIAGNOSIS — M545 Low back pain, unspecified: Secondary | ICD-10-CM | POA: Diagnosis not present

## 2023-03-21 DIAGNOSIS — M25551 Pain in right hip: Secondary | ICD-10-CM | POA: Diagnosis not present

## 2023-03-24 DIAGNOSIS — R600 Localized edema: Secondary | ICD-10-CM | POA: Diagnosis not present

## 2023-03-24 DIAGNOSIS — J309 Allergic rhinitis, unspecified: Secondary | ICD-10-CM | POA: Diagnosis not present

## 2023-03-24 DIAGNOSIS — I10 Essential (primary) hypertension: Secondary | ICD-10-CM | POA: Diagnosis not present

## 2023-03-24 DIAGNOSIS — E559 Vitamin D deficiency, unspecified: Secondary | ICD-10-CM | POA: Diagnosis not present

## 2023-03-24 DIAGNOSIS — E785 Hyperlipidemia, unspecified: Secondary | ICD-10-CM | POA: Diagnosis not present

## 2023-03-24 DIAGNOSIS — F419 Anxiety disorder, unspecified: Secondary | ICD-10-CM | POA: Diagnosis not present

## 2023-03-24 DIAGNOSIS — R5383 Other fatigue: Secondary | ICD-10-CM | POA: Diagnosis not present

## 2023-05-18 DIAGNOSIS — M25551 Pain in right hip: Secondary | ICD-10-CM | POA: Diagnosis not present

## 2023-05-18 DIAGNOSIS — M545 Low back pain, unspecified: Secondary | ICD-10-CM | POA: Diagnosis not present

## 2023-05-19 DIAGNOSIS — M545 Low back pain, unspecified: Secondary | ICD-10-CM | POA: Diagnosis not present

## 2023-05-23 DIAGNOSIS — M545 Low back pain, unspecified: Secondary | ICD-10-CM | POA: Diagnosis not present

## 2023-06-02 DIAGNOSIS — M5416 Radiculopathy, lumbar region: Secondary | ICD-10-CM | POA: Diagnosis not present

## 2023-06-20 DIAGNOSIS — M545 Low back pain, unspecified: Secondary | ICD-10-CM | POA: Diagnosis not present

## 2023-06-20 DIAGNOSIS — M5416 Radiculopathy, lumbar region: Secondary | ICD-10-CM | POA: Diagnosis not present

## 2023-06-20 DIAGNOSIS — M25551 Pain in right hip: Secondary | ICD-10-CM | POA: Diagnosis not present

## 2023-06-20 DIAGNOSIS — M51369 Other intervertebral disc degeneration, lumbar region without mention of lumbar back pain or lower extremity pain: Secondary | ICD-10-CM | POA: Diagnosis not present

## 2023-07-08 DIAGNOSIS — M5416 Radiculopathy, lumbar region: Secondary | ICD-10-CM | POA: Diagnosis not present

## 2023-07-08 DIAGNOSIS — M545 Low back pain, unspecified: Secondary | ICD-10-CM | POA: Diagnosis not present

## 2023-08-03 ENCOUNTER — Other Ambulatory Visit: Payer: Self-pay

## 2023-08-03 DIAGNOSIS — Z171 Estrogen receptor negative status [ER-]: Secondary | ICD-10-CM

## 2023-08-03 NOTE — Assessment & Plan Note (Deleted)
 invasive ductal carcinoma, pT1aN0M0, stage IA, ER-/PR-/HER2+, and high grade DCIS ER-/PR- -Diagnosed in 05/2016. S/p right breast lumpectomy, 2 breast re-excision surgeries, and adjuvant RT.  -she began close surveillance given ER/PR negative and HER-2 positive disease is more aggressive -Mammogram 10/15/2022 was benign  -Routine follow-up 07/2023 as scheduled

## 2023-08-03 NOTE — Progress Notes (Deleted)
 Patient Care Team: Breedlove, Brent C, PA-C as PCP - General (Family Medicine) [name], FNP as Nurse Practitioner (Family Medicine) [name], MD as Consulting Physician (General Surgery) Sonja Pinesburg, MD as Consulting Physician (Hematology) [name], MD as Consulting Physician (Radiation Oncology)  Clinic Day:  08/04/2023  Referring physician: Gwyndolyn Lerner, PA-C  ASSESSMENT & PLAN:   Assessment & Plan: Breast cancer of upper-outer quadrant of right female breast [facility]) invasive ductal carcinoma, pT1aN0M0, stage IA, ER-/PR-/HER2+, and high grade DCIS ER-/PR- -Diagnosed in 05/2016. S/p right breast lumpectomy, 2 breast re-excision surgeries, and adjuvant RT.  -she began close surveillance given ER/PR negative and HER-2 positive disease is more aggressive -Mammogram 10/15/2022 was benign  -Routine follow-up 07/2023 as scheduled    The patient understands the plans discussed today and is in agreement with them.  She knows to contact our office if she develops concerns prior to her next appointment.  I provided *** minutes of face-to-face time during this encounter and > 50% was spent counseling as documented under my assessment and plan.    Sharyon Deis, NP  [facility] - A DEPT OF [facility] [address] [place] [address] Dept: [phone] Dept Fax: [phone]   No orders of the defined types were placed in this encounter.     CHIEF COMPLAINT:  CC: Right breast cancer, estrogen receptor negative, HER2 positive  Current Treatment: Cancer surveillance  INTERVAL HISTORY:  Dawn West is here today for repeat clinical assessment.  Most recently had phone visit with Lacie, NP on 03/16/2023.  Did discuss results of DEXA scan which did show osteopenia.  She was started on Fosamax  weekly.  She denies fevers or chills. She denies pain. Her appetite is good. Her weight {Weight change:10426}.  I  have reviewed the past medical history, past surgical history, social history and family history with the patient and they are unchanged from previous note.  ALLERGIES:  is allergic to tramadol , allegra [fexofenadine hcl], ciprofloxacin, estradiol , oxycodone , penicillins, and sulfa antibiotics.  MEDICATIONS:  Current Outpatient Medications  Medication Sig Dispense Refill   acetaminophen  (TYLENOL ) 500 MG tablet Take 500 mg by mouth every 6 (six) hours as needed.     alendronate  (FOSAMAX ) 70 MG tablet Take 1 tablet (70 mg total) by mouth once a week. Take with a full glass of water  on an empty stomach. 12 tablet 3   Calcium  Carb-Cholecalciferol  (CALCIUM  600 + D) 600-200 MG-UNIT TABS Take 1 tablet by mouth 2 (two) times daily.      docusate sodium  (COLACE) 100 MG capsule Take 1 capsule (100 mg total) by mouth 2 (two) times daily. 60 capsule 1   fenofibrate micronized (LOFIBRA) 134 MG capsule Take 134 mg by mouth daily before breakfast.     fluticasone  (FLONASE ) 50 MCG/ACT nasal spray Place 1 spray into both nostrils daily.     Glucos-Chond-Hyal Ac-Ca Fructo (MOVE FREE JOINT HEALTH ADVANCE PO) Take 1 tablet by mouth daily.     Magnesium  250 MG TABS Take 250 mg by mouth 3 (three) times daily.      Melatonin 10 MG CAPS Take by mouth.     nitrofurantoin , macrocrystal-monohydrate, (MACROBID ) 100 MG capsule Take 1 capsule (100 mg total) by mouth 2 (two) times daily. (Patient not taking: Reported on 08/05/2022) 10 capsule 0   Omega-3 Fatty Acids (FISH OIL) 1000 MG CAPS Take 1,000 mg by mouth 3 (three) times daily. Takes 1-3 times  daily     polyethylene glycol (MIRALAX  / GLYCOLAX ) 17 g packet Take 17 g by mouth daily as needed.     Probiotic Product (PROBIOTIC DAILY PO) Take by mouth.     sertraline  (ZOLOFT ) 100 MG tablet Take 1 tablet (100 mg total) by mouth daily. (Patient taking differently: Take 100 mg by mouth daily.) 90 tablet 4   sodium chloride  (OCEAN) 0.65 % SOLN nasal spray Place 1 spray into both  nostrils 2 (two) times daily as needed for congestion.     tetrahydrozoline 0.05 % ophthalmic solution Place 1 drop into both eyes daily as needed (dry eyes).     traZODone (DESYREL) 100 MG tablet Take 100 mg by mouth at bedtime.     Vitamin D , Ergocalciferol , (DRISDOL ) 50000 units CAPS capsule Take 1 capsule (50,000 Units total) by mouth See admin instructions. Takes every other week on Friday 30 capsule 2   vitamin E  400 UNIT capsule Take 400 Units by mouth 3 (three) times daily.      zinc  gluconate 50 MG tablet Take 50 mg by mouth daily.     No current facility-administered medications for this visit.    HISTORY OF PRESENT ILLNESS:   Oncology History Overview Note  Cancer Staging Breast cancer of upper-outer quadrant of right female breast (HCC) Staging form: Breast, AJCC 8th Edition - Clinical: Stage 0 (cTis (DCIS), cN0, cM0, ER: Negative, PR: Negative) - Signed by Sonja Sedan, MD on 05/25/2016 - Pathologic stage from 06/02/2016: Stage IA (pT1a, pN0, cM0, G2, ER: Negative, PR: Negative, HER2: Positive) - Signed by Sonja Spearman, MD on 11/29/2016     Breast cancer of upper-outer quadrant of right female breast (HCC)  05/18/2016 Mammogram   Diagnostic mammogram of the right breast showed heterogeneous calcifications in a linear orientation within the posterior upper right breast and spanning a distance of 2.2 cm. No associated mass identified.   05/19/2016 Initial Biopsy   Right breast core needle biopsy showed ductal carcinoma with calcification, high-grade, focal highly suspicious for stromal invasion.   05/19/2016 Receptors her2   ER negative, PR negative   05/19/2016 Initial Diagnosis   Ductal carcinoma in situ (DCIS) of right breast   06/02/2016 Surgery   RIGHT BREAST LUMPECTOMY WITH RADIOACTIVE SEED AND SENTINEL LYMPH NODE BIOPSY by Dr. [name]   06/02/2016 Pathology Results   Diagnosis 06/02/16 1. Breast, lumpectomy, Right w/seed - INVASIVE DUCTAL CARCINOMA, GRADE 2, SPANNING 0.3  CM. - HIGH GRADE DUCTAL CARCINOMA IN SITU WITH NECROSIS. - INVASIVE CARCINOMA COMES TO WITHIN <0.1 CM OF THE POSTERIOR MARGIN FOCALLY. - DUCTAL CARCINOMA IN SITU COMES TO WITHIN <0.1 CM OF THE POSTERIOR MARGIN FOCALLY AND THE SUPERIOR MARGIN FOCALLY. - BIOPSY SITE. - SEE ONCOLOGY TABLE. 2. Breast, excision, Right additional Medial Margin - FIBROCYSTIC CHANGE. - USUAL DUCTAL HYPERPLASIA. - NO MALIGNANCY IDENTIFIED. 3. Lymph node, sentinel, biopsy, Right Axillary #1 - ONE OF ONE LYMPH NODES NEGATIVE FOR CARCINOMA (0/1). 4. Lymph node, sentinel, biopsy, Right Axillary - ONE OF ONE LYMPH NODES NEGATIVE FOR CARCINOMA (0/1).   06/02/2016 Receptors her2   ER-, PR-, HER2+    07/26/2016 Surgery   RE-EXCISION OF RIGHT BREAST LUMPECTOMY by Dr. [name]   07/26/2016 Pathology Results   Diagnosis 07/26/16 Breast, excision, Right new Superior Margin - DUCTAL CARCINOMA IN SITU WITH CALCIFICATIONS, HIGH GRADE, SPANNING AT LEAST 1.5 CM. - FOCI HIGHLY SUSPICIOUS FOR EARLY STROMAL INVASION. - DUCTAL CARCINOMA IN SITU IS BROADLY LESS THAN 0.1 CM TO THE NEW SUPERIOR MARGIN. -  SEE COMMENT.   08/26/2016 Pathology Results   Diagnosis 08/26/16 Breast, right, needle core biopsy, superior anterior - HIGH GRADE, DUCTAL CARCINOMA IN SITU - SEE COMMENT   09/06/2016 Surgery   RE-EXCISION OF RIGHT BREAST LUMPECTOMY WITH RADIOACTIVE SEED LOCALIZATION by Dr. [name]   09/06/2016 Pathology Results   Diagnosis 09/06/16 1. Breast, excision, Right Superior Margin - INVASIVE DUCTAL CARCINOMA, GRADE III/III, SCATTERED MICROSCOPIC FOCI. - DUCTAL CARCINOMA IN SITU WITH CALCIFICATIONS, HIGH GRADE. - INVASIVE CARCINOMA IS FOCALLY LESS THAN 0.1 CM TO THE INFERIOR MARGIN OF SPECIMEN #1.. - DUCTAL CARCINOMA IN SITU IS BROADLY LESS THAN 0.1 CM TO THE LATERAL MARGIN, FOCALLY LESS THAN 0.1 CM TO THE INFERIOR MARGIN, AND FOCALLY LESS THAN 0.1 CM TO THE ANTERIOR MARGIN OF SPECIMEN #1. - SEE COMMENT. 2. Breast, excision, Right  Superolateral Margin - FIBROCYSTIC CHANGES. - THERE IS NO EVIDENCE OF MALIGNANCY. - SEE COMMENT.   10/13/2016 - 11/30/2016 Radiation Therapy   Radiation by Dr. [name]  Radiation treatment dates:   10/13/16 - 11/30/16 Site/dose:    1) Right Breast treated to 50.4 Gy in 28 fractions 2) Right Breast Boosted an additional 12 Gy in 6 fractions             Beams/energy:    1) 3D  //  6X 2) 3D  //  6X Narrative: The patient tolerated radiation treatment relatively well. The patient experienced occasional mild pain in her right breast. She also reported some fatigue and appetite reduction throughout treatment, though this did not affect her activities of daily living. She experienced some radiation related skin changes including moderate erythema across the right breast.    05/31/2017 Mammogram   Diagnostic Mammogram 05/31/17  IMPRESSION: No mammographic evidence of malignancy. RECOMMENDATION: Annual diagnostic mammography.       REVIEW OF SYSTEMS:   Constitutional: Denies fevers, chills or abnormal weight loss Eyes: Denies blurriness of vision Ears, nose, mouth, throat, and face: Denies mucositis or sore throat Respiratory: Denies cough, dyspnea or wheezes Cardiovascular: Denies palpitation, chest discomfort or lower extremity swelling Gastrointestinal:  Denies nausea, heartburn or change in bowel habits Skin: Denies abnormal skin rashes Lymphatics: Denies new lymphadenopathy or easy bruising Neurological:Denies numbness, tingling or new weaknesses Behavioral/Psych: Mood is stable, no new changes  All other systems were reviewed with the patient and are negative.   VITALS:  Last menstrual period 12/12/1996.  Wt Readings from Last 3 Encounters:  08/05/22 139 lb 5 oz (63.2 kg)  05/12/22 144 lb (65.3 kg)  07/30/21 142 lb 4.8 oz (64.5 kg)    There is no height or weight on file to calculate BMI.  Performance status (ECOG): {CHL ONC [id number]  PHYSICAL EXAM:   GENERAL:alert,  no distress and comfortable SKIN: skin color, texture, turgor are normal, no rashes or significant lesions EYES: normal, Conjunctiva are pink and non-injected, sclera clear OROPHARYNX:no exudate, no erythema and lips, buccal mucosa, and tongue normal  NECK: supple, thyroid  normal size, non-tender, without nodularity LYMPH:  no palpable lymphadenopathy in the cervical, axillary or inguinal LUNGS: clear to auscultation and percussion with normal breathing effort HEART: regular rate & rhythm and no murmurs and no lower extremity edema ABDOMEN:abdomen soft, non-tender and normal bowel sounds Musculoskeletal:no cyanosis of digits and no clubbing  NEURO: alert & oriented x 3 with fluent speech, no focal motor/sensory deficits  LABORATORY DATA:  I have reviewed the data as listed    Component Value Date/Time   NA 140 08/05/2022 0923   K 3.9 08/05/2022  0923   CL 104 08/05/2022 0923   CO2 28 08/05/2022 0923   GLUCOSE 101 (H) 08/05/2022 0923   BUN 25 (H) 08/05/2022 0923   CREATININE 0.93 08/05/2022 0923   CREATININE 0.66 09/19/2015 0957   CALCIUM  9.6 08/05/2022 0923   PROT 7.2 08/05/2022 0923   ALBUMIN 4.7 08/05/2022 0923   AST 17 08/05/2022 0923   ALT 13 08/05/2022 0923   ALKPHOS 37 (L) 08/05/2022 0923   BILITOT 0.4 08/05/2022 0923   GFRNONAA >60 08/05/2022 0923   GFRAA >60 12/31/2019 1052    Lab Results  Component Value Date   SPEP . 11/30/2017    Lab Results  Component Value Date   WBC 3.7 (L) 08/05/2022   NEUTROABS 1.6 (L) 08/05/2022   HGB 13.1 08/05/2022   HCT 38.6 08/05/2022   MCV 90.0 08/05/2022   PLT 275 08/05/2022      Chemistry      Component Value Date/Time   NA 140 08/05/2022 0923   K 3.9 08/05/2022 0923   CL 104 08/05/2022 0923   CO2 28 08/05/2022 0923   BUN 25 (H) 08/05/2022 0923   CREATININE 0.93 08/05/2022 0923   CREATININE 0.66 09/19/2015 0957      Component Value Date/Time   CALCIUM  9.6 08/05/2022 0923   ALKPHOS 37 (L) 08/05/2022 0923   AST 17  08/05/2022 0923   ALT 13 08/05/2022 0923   BILITOT 0.4 08/05/2022 0923       RADIOGRAPHIC STUDIES: I have personally reviewed the radiological images as listed and agreed with the findings in the report. No results found.

## 2023-08-04 ENCOUNTER — Inpatient Hospital Stay: Payer: BLUE CROSS/BLUE SHIELD

## 2023-08-04 ENCOUNTER — Inpatient Hospital Stay: Payer: BLUE CROSS/BLUE SHIELD | Admitting: Nurse Practitioner

## 2023-08-04 DIAGNOSIS — H01002 Unspecified blepharitis right lower eyelid: Secondary | ICD-10-CM | POA: Diagnosis not present

## 2023-08-04 DIAGNOSIS — H01004 Unspecified blepharitis left upper eyelid: Secondary | ICD-10-CM | POA: Diagnosis not present

## 2023-08-04 DIAGNOSIS — H01005 Unspecified blepharitis left lower eyelid: Secondary | ICD-10-CM | POA: Diagnosis not present

## 2023-08-04 DIAGNOSIS — H16223 Keratoconjunctivitis sicca, not specified as Sjogren's, bilateral: Secondary | ICD-10-CM | POA: Diagnosis not present

## 2023-08-04 DIAGNOSIS — Z171 Estrogen receptor negative status [ER-]: Secondary | ICD-10-CM

## 2023-08-04 DIAGNOSIS — H01001 Unspecified blepharitis right upper eyelid: Secondary | ICD-10-CM | POA: Diagnosis not present

## 2023-08-08 DIAGNOSIS — M25551 Pain in right hip: Secondary | ICD-10-CM | POA: Diagnosis not present

## 2023-08-08 DIAGNOSIS — M51369 Other intervertebral disc degeneration, lumbar region without mention of lumbar back pain or lower extremity pain: Secondary | ICD-10-CM | POA: Diagnosis not present

## 2023-08-08 DIAGNOSIS — M5416 Radiculopathy, lumbar region: Secondary | ICD-10-CM | POA: Diagnosis not present

## 2023-08-08 DIAGNOSIS — M25569 Pain in unspecified knee: Secondary | ICD-10-CM | POA: Diagnosis not present

## 2023-08-08 DIAGNOSIS — M545 Low back pain, unspecified: Secondary | ICD-10-CM | POA: Diagnosis not present

## 2023-08-29 NOTE — Progress Notes (Signed)
 Patient Care Team: [name] as PCP - General (Family Medicine) [name], FNP as Nurse Practitioner (Family Medicine) [name], MD as Consulting Physician (General Surgery) [name], MD as Consulting Physician (Hematology) [name], MD as Consulting Physician (Radiation Oncology)   CHIEF COMPLAINT: Follow up right breast cancer   Oncology History Overview Note  Cancer Staging Breast cancer of upper-outer quadrant of right female breast [facility]) Staging form: Breast, AJCC 8th Edition - Clinical: Stage 0 (cTis (DCIS), cN0, cM0, ER: Negative, PR: Negative) - Signed by [name], MD on 05/25/2016 - Pathologic stage from 06/02/2016: Stage IA (pT1a, pN0, cM0, G2, ER: Negative, PR: Negative, HER2: Positive) - Signed by [name], MD on 11/29/2016     Breast cancer of upper-outer quadrant of right female breast (HCC)  05/18/2016 Mammogram   Diagnostic mammogram of the right breast showed heterogeneous calcifications in a linear orientation within the posterior upper right breast and spanning a distance of 2.2 cm. No associated mass identified.   05/19/2016 Initial Biopsy   Right breast core needle biopsy showed ductal carcinoma with calcification, high-grade, focal highly suspicious for stromal invasion.   05/19/2016 Receptors her2   ER negative, PR negative   05/19/2016 Initial Diagnosis   Ductal carcinoma in situ (DCIS) of right breast   06/02/2016 Surgery   RIGHT BREAST LUMPECTOMY WITH RADIOACTIVE SEED AND SENTINEL LYMPH NODE BIOPSY by Dr. [name]   06/02/2016 Pathology Results   Diagnosis 06/02/16 1. Breast, lumpectomy, Right w/seed - INVASIVE DUCTAL CARCINOMA, GRADE 2, SPANNING 0.3 CM. - HIGH GRADE DUCTAL CARCINOMA IN SITU WITH NECROSIS. - INVASIVE CARCINOMA COMES TO WITHIN <0.1 CM OF THE POSTERIOR MARGIN FOCALLY. - DUCTAL CARCINOMA IN SITU COMES TO WITHIN <0.1 CM OF THE POSTERIOR MARGIN FOCALLY AND THE SUPERIOR MARGIN FOCALLY. - BIOPSY SITE. - SEE  ONCOLOGY TABLE. 2. Breast, excision, Right additional Medial Margin - FIBROCYSTIC CHANGE. - USUAL DUCTAL HYPERPLASIA. - NO MALIGNANCY IDENTIFIED. 3. Lymph node, sentinel, biopsy, Right Axillary #1 - ONE OF ONE LYMPH NODES NEGATIVE FOR CARCINOMA (0/1). 4. Lymph node, sentinel, biopsy, Right Axillary - ONE OF ONE LYMPH NODES NEGATIVE FOR CARCINOMA (0/1).   06/02/2016 Receptors her2   ER-, PR-, HER2+    07/26/2016 Surgery   RE-EXCISION OF RIGHT BREAST LUMPECTOMY by Dr. [name]   07/26/2016 Pathology Results   Diagnosis 07/26/16 Breast, excision, Right new Superior Margin - DUCTAL CARCINOMA IN SITU WITH CALCIFICATIONS, HIGH GRADE, SPANNING AT LEAST 1.5 CM. - FOCI HIGHLY SUSPICIOUS FOR EARLY STROMAL INVASION. - DUCTAL CARCINOMA IN SITU IS BROADLY LESS THAN 0.1 CM TO THE NEW SUPERIOR MARGIN. - SEE COMMENT.   08/26/2016 Pathology Results   Diagnosis 08/26/16 Breast, right, needle core biopsy, superior anterior - HIGH GRADE, DUCTAL CARCINOMA IN SITU - SEE COMMENT   09/06/2016 Surgery   RE-EXCISION OF RIGHT BREAST LUMPECTOMY WITH RADIOACTIVE SEED LOCALIZATION by Dr. [name]   09/06/2016 Pathology Results   Diagnosis 09/06/16 1. Breast, excision, Right Superior Margin - INVASIVE DUCTAL CARCINOMA, GRADE III/III, SCATTERED MICROSCOPIC FOCI. - DUCTAL CARCINOMA IN SITU WITH CALCIFICATIONS, HIGH GRADE. - INVASIVE CARCINOMA IS FOCALLY LESS THAN 0.1 CM TO THE INFERIOR MARGIN OF SPECIMEN #1.. - DUCTAL CARCINOMA IN SITU IS BROADLY LESS THAN 0.1 CM TO THE LATERAL MARGIN, FOCALLY LESS THAN 0.1 CM TO THE INFERIOR MARGIN, AND FOCALLY LESS THAN 0.1 CM TO THE ANTERIOR MARGIN OF SPECIMEN #1. - SEE COMMENT. 2. Breast, excision, Right Superolateral Margin - FIBROCYSTIC CHANGES. - THERE IS NO EVIDENCE OF MALIGNANCY. -  SEE COMMENT.   10/13/2016 - 11/30/2016 Radiation Therapy   Radiation by Dr. [name]  Radiation treatment dates:   10/13/16 - 11/30/16 Site/dose:    1) Right Breast treated to 50.4 Gy in 28  fractions 2) Right Breast Boosted an additional 12 Gy in 6 fractions             Beams/energy:    1) 3D  //  6X 2) 3D  //  6X Narrative: The patient tolerated radiation treatment relatively well. The patient experienced occasional mild pain in her right breast. She also reported some fatigue and appetite reduction throughout treatment, though this did not affect her activities of daily living. She experienced some radiation related skin changes including moderate erythema across the right breast.    05/31/2017 Mammogram   Diagnostic Mammogram 05/31/17  IMPRESSION: No mammographic evidence of malignancy. RECOMMENDATION: Annual diagnostic mammography.      CURRENT THERAPY: Breast cancer surveillance   INTERVAL HISTORY Ms. Dawn West returns for follow up as scheduled. Last visit in 03/2023 was virtual and she started Fosamax , gets cold when she takes it with a large glass of water  but otherwise tolerating.  Right breast remains tender especially when her grandson puts pressure on it, but this is unchanged and no new lump/mass or nipple discharge.  Ortho revealed 2 pinched nerves in her back, an injection resolve the pain and she is doing much better.  Still has a concern in the right abdomen, a PCP feels it may be a lipoma, gets tender at times. Also has burping at night after dinner. This eventually goes away after she expels the gas.  ROS  All other systems reviewed and negative  Past Medical History:  Diagnosis Date   Anxiety    Arthritis    knees   Breast cancer (HCC) 2018   Right Breast   Cancer (HCC) 12/01/11   basal cell skin ca Left upper arm   Cancer (HCC) 04/20/2016   right breast cancer   Complication of anesthesia    Depression    Headache    History of radiation therapy 10/13/16-11/30/16   right breast 50.4 Gy in 28 fractions, right breast boost 12 Gy in 6 fractions   Hypercholesteremia    Personal history of radiation therapy 2018   Right Breast Cancer   PONV  (postoperative nausea and vomiting)      Past Surgical History:  Procedure Laterality Date   ABDOMINAL HYSTERECTOMY     BREAST LUMPECTOMY Right 2018   BREAST LUMPECTOMY WITH RADIOACTIVE SEED AND SENTINEL LYMPH NODE BIOPSY Right 06/02/2016   Procedure: RIGHT BREAST LUMPECTOMY WITH RADIOACTIVE SEED AND SENTINEL LYMPH NODE BIOPSY;  Surgeon: [name], MD;  Location: [facility];  Service: General;  Laterality: Right;   BREAST LUMPECTOMY WITH RADIOACTIVE SEED LOCALIZATION Right 09/06/2016   Procedure: RE-EXCISION OF RIGHT BREAST LUMPECTOMY WITH RADIOACTIVE SEED LOCALIZATION;  Surgeon: [name], MD;  Location: [facility];  Service: General;  Laterality: Right;   BREAST SURGERY  05/19/2016   right breast biopsy   CHOLECYSTECTOMY N/A 06/13/2019   Procedure: LAPAROSCOPIC CHOLECYSTECTOMY;  Surgeon: Oza Blumenthal, MD;  Location: [facility];  Service: General;  Laterality: N/A;   KNEE ARTHROPLASTY Left 06/10/2016   Procedure: LEFT TOTAL KNEE ARTHROPLASTY WITH COMPUTER NAVIGATION;  Surgeon: Adonica Hoose, MD;  Location: [facility];  Service: Orthopedics;  Laterality: Left;   KNEE ARTHROPLASTY Right 01/20/2017   Procedure: RIGHT TOTAL KNEE ARTHROPLASTY WITH COMPUTER NAVIGATION;  Surgeon: Adonica Hoose,  MD;  Location: [facility];  Service: Orthopedics;  Laterality: Right;  Adductor Block   MOUTH SURGERY     RE-EXCISION OF BREAST LUMPECTOMY Right 07/26/2016   Procedure: RE-EXCISION OF RIGHT BREAST LUMPECTOMY;  Surgeon: [name], MD;  Location: [facility];  Service: General;  Laterality: Right;   SKIN CANCER EXCISION Left 2013   upper arm, basal cell   TONSILLECTOMY AND ADENOIDECTOMY     (DEVIATED SEPTUM  SAME TIME)   TOTAL VAGINAL HYSTERECTOMY  12/1996   menorrhagia, fibroids     Outpatient Encounter Medications as of 08/30/2023  Medication Sig   acetaminophen  (TYLENOL ) 500 MG tablet Take 500 mg by mouth every 6 (six) hours as needed.   alendronate  (FOSAMAX )  70 MG tablet Take 1 tablet (70 mg total) by mouth once a week. Take with a full glass of water  on an empty stomach.   Calcium  Carb-Cholecalciferol  (CALCIUM  600 + D) 600-200 MG-UNIT TABS Take 1 tablet by mouth 2 (two) times daily.    docusate sodium  (COLACE) 100 MG capsule Take 1 capsule (100 mg total) by mouth 2 (two) times daily.   fenofibrate micronized (LOFIBRA) 134 MG capsule Take 134 mg by mouth daily before breakfast.   fluticasone  (FLONASE ) 50 MCG/ACT nasal spray Place 1 spray into both nostrils daily.   Glucos-Chond-Hyal Ac-Ca Fructo (MOVE FREE JOINT HEALTH ADVANCE PO) Take 1 tablet by mouth daily.   Magnesium  250 MG TABS Take 250 mg by mouth 3 (three) times daily.    Melatonin 10 MG CAPS Take by mouth.   nitrofurantoin , macrocrystal-monohydrate, (MACROBID ) 100 MG capsule Take 1 capsule (100 mg total) by mouth 2 (two) times daily. (Patient not taking: Reported on 08/05/2022)   Omega-3 Fatty Acids (FISH OIL) 1000 MG CAPS Take 1,000 mg by mouth 3 (three) times daily. Takes 1-3 times daily   polyethylene glycol (MIRALAX  / GLYCOLAX ) 17 g packet Take 17 g by mouth daily as needed.   Probiotic Product (PROBIOTIC DAILY PO) Take by mouth.   sertraline  (ZOLOFT ) 100 MG tablet Take 1 tablet (100 mg total) by mouth daily. (Patient taking differently: Take 100 mg by mouth daily.)   sodium chloride  (OCEAN) 0.65 % SOLN nasal spray Place 1 spray into both nostrils 2 (two) times daily as needed for congestion.   tetrahydrozoline 0.05 % ophthalmic solution Place 1 drop into both eyes daily as needed (dry eyes).   traZODone (DESYREL) 100 MG tablet Take 100 mg by mouth at bedtime.   Vitamin D , Ergocalciferol , (DRISDOL ) 50000 units CAPS capsule Take 1 capsule (50,000 Units total) by mouth See admin instructions. Takes every other week on Friday   vitamin E  400 UNIT capsule Take 400 Units by mouth 3 (three) times daily.    zinc  gluconate 50 MG tablet Take 50 mg by mouth daily.   No facility-administered  encounter medications on file as of 08/30/2023.     Today's Vitals   08/30/23 1011  BP: 137/71  Pulse: (!) 59  Resp: 13  Temp: (!) 97.5 F (36.4 C)  TempSrc: Temporal  SpO2: 96%  Weight: 143 lb (64.9 kg)   Body mass index is 26.16 kg/m.     PHYSICAL EXAM GENERAL:alert, no distress and comfortable SKIN: small erythematous plaque eruption to right calf EYES: sclera clear NECK: without mass LYMPH:  no palpable cervical or supraclavicular lymphadenopathy  LUNGS: clear with normal breathing effort HEART: regular rate & rhythm, no lower extremity edema ABDOMEN: abdomen soft, non-tender and normal bowel sounds. No palpable mass or abnormality  over the RUQ NEURO: alert & oriented x 3 with fluent speech, no focal motor/sensory deficits Breast exam: S/p right lumpectomy, incisions completely healed with scar tissue and diffuse tenderness.  Fullness in the right upper breast likely scar tissue versus possible seroma without discrete mass.  No palpable mass or nodularity in either breast or axilla that I could appreciate   CBC    Component Value Date/Time   WBC 3.6 (L) 08/30/2023 0857   WBC 3.4 (L) 07/30/2021 0920   RBC 4.12 08/30/2023 0857   HGB 12.4 08/30/2023 0857   HGB 14.1 06/28/2014 1320   HCT 36.9 08/30/2023 0857   HCT 30.3 (L) 11/30/2017 0956   PLT 205 08/30/2023 0857   MCV 89.6 08/30/2023 0857   MCH 30.1 08/30/2023 0857   MCHC 33.6 08/30/2023 0857   RDW 11.9 08/30/2023 0857   LYMPHSABS 1.2 08/30/2023 0857   MONOABS 0.4 08/30/2023 0857   EOSABS 0.2 08/30/2023 0857   BASOSABS 0.1 08/30/2023 0857     CMP     Component Value Date/Time   NA 141 08/30/2023 0857   K 4.2 08/30/2023 0857   CL 108 08/30/2023 0857   CO2 28 08/30/2023 0857   GLUCOSE 109 (H) 08/30/2023 0857   BUN 17 08/30/2023 0857   CREATININE 0.84 08/30/2023 0857   CREATININE 0.66 09/19/2015 0957   CALCIUM  9.4 08/30/2023 0857   PROT 6.7 08/30/2023 0857   ALBUMIN 4.7 08/30/2023 0857   AST 16  08/30/2023 0857   ALT 13 08/30/2023 0857   ALKPHOS 29 (L) 08/30/2023 0857   BILITOT 0.4 08/30/2023 0857   GFRNONAA >60 08/30/2023 0857   GFRAA >60 12/31/2019 1052     ASSESSMENT & PLAN:Tarry Amra Shukla is a 73 y.o. female with     Breast cancer of upper outer quadrant of right breast, invasive ductal carcinoma, pT1aN0M0, stage IA, ER-/PR-/HER2+, and high grade DCIS ER-/PR- -Diagnosed in 05/2016. S/p right breast lumpectomy, 2 breast re-excision surgeries, and adjuvant RT.  -she began close surveillance given ER/PR negative and HER-2 positive disease is more aggressive -Mammogram 10/15/2022 was benign  - Ms. Allende is clinically doing well.  Exam is benign, labs are unremarkable.  Overall no clinical concern for recurrence.  Due mammogram in June  - She prefers to continue annual follow-up in our clinic  Skin eruption - She has a small plaque on the right calf, I recommend topical hydrocortisone and follow-up with PCP or Derm  GERD - She has tried Rolaids which do not help.  - Encouraged to consume adequate water  during meals and sit upright for at least 2 hours - Can try OTC Pepcid or Nexium, follow-up with PCP  Abdominal abnormality - Onset 2021, she feels a protrusion at the right upper quadrant which gets tender at times.  PCP notes it could be a lipoma - RUQ abdominal ultrasound 08/2022 was unremarkable - Difficult to palpate on exam today, may be some slight asymmetry of the abdominal wall but no palpable abnormality; she remains concerned about it.   -Offered surgical referral and she accepts  Osteopenia  -Her 05/2018 DEXA shows osteopenia with lowest T-score of -1.6 at left hip. -DEXA 01/07/2021 showed osteopenia at the left femur neck T score -1.8 with FRAX score 11% risk of major osteoporotic fracture and 2% risk of hip fracture -Continue Calcium  and VitD and remain active with weight bearing exercise. Given her 2 knee replacement her orthopedist did not recommend walking.   -Repeat DEXA 03/11/2023 shows slightly worse osteopenia  T-score -2.0, with increased fracture risk. -Hip fracture risk up to 2.9% in 10 years, with a 12.9% risk of major osteoporotic fracture in 10 years.  This has been gradually increasing since DEXA's in 2020 -We reviewed treatment options and she eventually agreed to Fosamax , started 03/2023    Back pain with sciatica, b/l hip and knee pains -lumbar pain onset 01/15/21 after working in the yard. She was in severe pain while in [place] helping her daughter with childcare while she recovered from surgery -managed with tylenol  TID and topical muscle relaxe -Xray showed degenerative changes and arthritis -she has arthritic pains, worse in cold weather and with activity; resolves with Aspercreme and heat -She has had some various aches and pains including sciatica -Followed by Ortho, s/p injections, pain resolved   PLAN: -Labs reviewed -Continue breast cancer surveillance -Mammogram due in June -Symptom management for skin eruption, GERD -Surgical referral for RUQ issue and sent Dr. [name] a message -Follow-up in 1 year, or sooner if needed  Orders Placed This Encounter  Procedures   MM 3D SCREENING MAMMOGRAM BILATERAL BREAST    Standing Status:   Future    Expected Date:   10/16/2023    Expiration Date:   08/29/2024    Reason for Exam (SYMPTOM  OR DIAGNOSIS REQUIRED):   h/o R breast cancer 2018    Preferred [facility]?:   [facility]   Ambulatory referral to General Surgery    Referral Priority:   Routine    Referral Type:   Surgical    Referral Reason:   Specialty Services Required    Referred to Provider:   [name], MD    Requested Specialty:   General Surgery    Number of Visits Requested:   1      All questions were answered. The patient knows to call the clinic with any problems, questions or concerns. No barriers to learning were detected. I spent 20 minutes counseling the patient face to face. The total time spent  in the appointment was 30 minutes and more than 50% was on counseling, review of test results, and coordination of care.   Nocole Zammit, NP-C 08/30/2023

## 2023-08-30 ENCOUNTER — Inpatient Hospital Stay (HOSPITAL_BASED_OUTPATIENT_CLINIC_OR_DEPARTMENT_OTHER): Admitting: Nurse Practitioner

## 2023-08-30 ENCOUNTER — Inpatient Hospital Stay: Attending: Nurse Practitioner

## 2023-08-30 ENCOUNTER — Encounter: Payer: Self-pay | Admitting: Nurse Practitioner

## 2023-08-30 VITALS — BP 137/71 | HR 59 | Temp 97.5°F | Resp 13 | Wt 143.0 lb

## 2023-08-30 DIAGNOSIS — R790 Abnormal level of blood mineral: Secondary | ICD-10-CM | POA: Diagnosis not present

## 2023-08-30 DIAGNOSIS — Z923 Personal history of irradiation: Secondary | ICD-10-CM | POA: Diagnosis not present

## 2023-08-30 DIAGNOSIS — R10811 Right upper quadrant abdominal tenderness: Secondary | ICD-10-CM

## 2023-08-30 DIAGNOSIS — C50411 Malignant neoplasm of upper-outer quadrant of right female breast: Secondary | ICD-10-CM

## 2023-08-30 DIAGNOSIS — M8580 Other specified disorders of bone density and structure, unspecified site: Secondary | ICD-10-CM | POA: Insufficient documentation

## 2023-08-30 DIAGNOSIS — Z171 Estrogen receptor negative status [ER-]: Secondary | ICD-10-CM

## 2023-08-30 DIAGNOSIS — Z853 Personal history of malignant neoplasm of breast: Secondary | ICD-10-CM | POA: Insufficient documentation

## 2023-08-30 DIAGNOSIS — Z1231 Encounter for screening mammogram for malignant neoplasm of breast: Secondary | ICD-10-CM | POA: Diagnosis not present

## 2023-08-30 LAB — CMP (CANCER CENTER ONLY)
ALT: 13 U/L (ref 0–44)
AST: 16 U/L (ref 15–41)
Albumin: 4.7 g/dL (ref 3.5–5.0)
Alkaline Phosphatase: 29 U/L — ABNORMAL LOW (ref 38–126)
Anion gap: 5 (ref 5–15)
BUN: 17 mg/dL (ref 8–23)
CO2: 28 mmol/L (ref 22–32)
Calcium: 9.4 mg/dL (ref 8.9–10.3)
Chloride: 108 mmol/L (ref 98–111)
Creatinine: 0.84 mg/dL (ref 0.44–1.00)
GFR, Estimated: >60 mL/min (ref >60)
Glucose, Bld: 109 mg/dL — ABNORMAL HIGH (ref 70–99)
Potassium: 4.2 mmol/L (ref 3.5–5.1)
Sodium: 141 mmol/L (ref 135–145)
Total Bilirubin: 0.4 mg/dL (ref 0.0–1.2)
Total Protein: 6.7 g/dL (ref 6.5–8.1)

## 2023-08-30 LAB — CBC WITH DIFFERENTIAL (CANCER CENTER ONLY)
Abs Immature Granulocytes: 0.01 10*3/uL (ref 0.00–0.07)
Basophils Absolute: 0.1 10*3/uL (ref 0.0–0.1)
Basophils Relative: 1 %
Eosinophils Absolute: 0.2 10*3/uL (ref 0.0–0.5)
Eosinophils Relative: 6 %
HCT: 36.9 % (ref 36.0–46.0)
Hemoglobin: 12.4 g/dL (ref 12.0–15.0)
Immature Granulocytes: 0 %
Lymphocytes Relative: 34 %
Lymphs Abs: 1.2 10*3/uL (ref 0.7–4.0)
MCH: 30.1 pg (ref 26.0–34.0)
MCHC: 33.6 g/dL (ref 30.0–36.0)
MCV: 89.6 fL (ref 80.0–100.0)
Monocytes Absolute: 0.4 10*3/uL (ref 0.1–1.0)
Monocytes Relative: 10 %
Neutro Abs: 1.7 10*3/uL (ref 1.7–7.7)
Neutrophils Relative %: 49 %
Platelet Count: 205 10*3/uL (ref 150–400)
RBC: 4.12 MIL/uL (ref 3.87–5.11)
RDW: 11.9 % (ref 11.5–15.5)
WBC Count: 3.6 10*3/uL — ABNORMAL LOW (ref 4.0–10.5)
nRBC: 0 % (ref 0.0–0.2)

## 2023-08-30 LAB — IRON AND IRON BINDING CAPACITY (CC-WL,HP ONLY)
Iron: 122 ug/dL (ref 28–170)
Saturation Ratios: 25 % (ref 10.4–31.8)
TIBC: 493 ug/dL — ABNORMAL HIGH (ref 250–450)
UIBC: 371 ug/dL (ref 148–442)

## 2023-08-30 LAB — FERRITIN: Ferritin: 581 ng/mL — ABNORMAL HIGH (ref 11–307)

## 2023-09-01 DIAGNOSIS — H16223 Keratoconjunctivitis sicca, not specified as Sjogren's, bilateral: Secondary | ICD-10-CM | POA: Diagnosis not present

## 2023-09-01 DIAGNOSIS — H01002 Unspecified blepharitis right lower eyelid: Secondary | ICD-10-CM | POA: Diagnosis not present

## 2023-09-01 DIAGNOSIS — H01005 Unspecified blepharitis left lower eyelid: Secondary | ICD-10-CM | POA: Diagnosis not present

## 2023-09-01 DIAGNOSIS — H01004 Unspecified blepharitis left upper eyelid: Secondary | ICD-10-CM | POA: Diagnosis not present

## 2023-09-01 DIAGNOSIS — H01001 Unspecified blepharitis right upper eyelid: Secondary | ICD-10-CM | POA: Diagnosis not present

## 2023-10-19 ENCOUNTER — Ambulatory Visit
Admission: RE | Admit: 2023-10-19 | Discharge: 2023-10-19 | Disposition: A | Discharge status: Home / Self Care | Source: Ambulatory Visit | Attending: Nurse Practitioner | Admitting: Nurse Practitioner

## 2023-10-19 DIAGNOSIS — Z1231 Encounter for screening mammogram for malignant neoplasm of breast: Secondary | ICD-10-CM

## 2023-10-25 DIAGNOSIS — M5431 Sciatica, right side: Secondary | ICD-10-CM | POA: Diagnosis not present

## 2023-10-25 DIAGNOSIS — M9903 Segmental and somatic dysfunction of lumbar region: Secondary | ICD-10-CM | POA: Diagnosis not present

## 2023-10-26 DIAGNOSIS — M9903 Segmental and somatic dysfunction of lumbar region: Secondary | ICD-10-CM | POA: Diagnosis not present

## 2023-10-26 DIAGNOSIS — M5431 Sciatica, right side: Secondary | ICD-10-CM | POA: Diagnosis not present

## 2023-10-28 ENCOUNTER — Other Ambulatory Visit: Payer: Self-pay | Admitting: General Surgery

## 2023-10-28 ENCOUNTER — Encounter: Payer: Self-pay | Admitting: General Surgery

## 2023-10-28 DIAGNOSIS — R1011 Right upper quadrant pain: Secondary | ICD-10-CM

## 2023-10-31 DIAGNOSIS — M9903 Segmental and somatic dysfunction of lumbar region: Secondary | ICD-10-CM | POA: Diagnosis not present

## 2023-10-31 DIAGNOSIS — M5431 Sciatica, right side: Secondary | ICD-10-CM | POA: Diagnosis not present

## 2023-11-02 DIAGNOSIS — M9903 Segmental and somatic dysfunction of lumbar region: Secondary | ICD-10-CM | POA: Diagnosis not present

## 2023-11-02 DIAGNOSIS — M5431 Sciatica, right side: Secondary | ICD-10-CM | POA: Diagnosis not present

## 2023-11-07 DIAGNOSIS — E785 Hyperlipidemia, unspecified: Secondary | ICD-10-CM | POA: Diagnosis not present

## 2023-11-07 DIAGNOSIS — E559 Vitamin D deficiency, unspecified: Secondary | ICD-10-CM | POA: Diagnosis not present

## 2023-11-07 DIAGNOSIS — Z6825 Body mass index (BMI) 25.0-25.9, adult: Secondary | ICD-10-CM | POA: Diagnosis not present

## 2023-11-07 DIAGNOSIS — I10 Essential (primary) hypertension: Secondary | ICD-10-CM | POA: Diagnosis not present

## 2023-11-07 DIAGNOSIS — Z1239 Encounter for other screening for malignant neoplasm of breast: Secondary | ICD-10-CM | POA: Diagnosis not present

## 2023-11-07 DIAGNOSIS — F411 Generalized anxiety disorder: Secondary | ICD-10-CM | POA: Diagnosis not present

## 2023-11-07 DIAGNOSIS — Z1211 Encounter for screening for malignant neoplasm of colon: Secondary | ICD-10-CM | POA: Diagnosis not present

## 2023-11-07 DIAGNOSIS — Z0000 Encounter for general adult medical examination without abnormal findings: Secondary | ICD-10-CM | POA: Diagnosis not present

## 2023-11-08 DIAGNOSIS — M9903 Segmental and somatic dysfunction of lumbar region: Secondary | ICD-10-CM | POA: Diagnosis not present

## 2023-11-08 DIAGNOSIS — M5431 Sciatica, right side: Secondary | ICD-10-CM | POA: Diagnosis not present

## 2023-11-10 ENCOUNTER — Ambulatory Visit
Admission: RE | Admit: 2023-11-10 | Discharge: 2023-11-10 | Disposition: A | Discharge status: Home / Self Care | Source: Ambulatory Visit | Attending: General Surgery | Admitting: General Surgery

## 2023-11-10 DIAGNOSIS — R1011 Right upper quadrant pain: Secondary | ICD-10-CM

## 2023-11-10 DIAGNOSIS — K5730 Diverticulosis of large intestine without perforation or abscess without bleeding: Secondary | ICD-10-CM | POA: Diagnosis not present

## 2023-11-10 MED ORDER — IOPAMIDOL (ISOVUE-300) INJECTION 61%
100.0000 mL | Freq: Once | INTRAVENOUS | Status: AC | PRN
  Administered 2023-11-10: 100 mL via INTRAVENOUS

## 2023-11-14 DIAGNOSIS — M5431 Sciatica, right side: Secondary | ICD-10-CM | POA: Diagnosis not present

## 2023-11-14 DIAGNOSIS — M9903 Segmental and somatic dysfunction of lumbar region: Secondary | ICD-10-CM | POA: Diagnosis not present

## 2023-11-15 ENCOUNTER — Ambulatory Visit: Payer: Self-pay | Admitting: General Surgery

## 2023-11-16 DIAGNOSIS — M9903 Segmental and somatic dysfunction of lumbar region: Secondary | ICD-10-CM | POA: Diagnosis not present

## 2023-11-16 DIAGNOSIS — M5431 Sciatica, right side: Secondary | ICD-10-CM | POA: Diagnosis not present

## 2023-11-21 DIAGNOSIS — M5431 Sciatica, right side: Secondary | ICD-10-CM | POA: Diagnosis not present

## 2023-11-21 DIAGNOSIS — M9903 Segmental and somatic dysfunction of lumbar region: Secondary | ICD-10-CM | POA: Diagnosis not present

## 2023-11-23 DIAGNOSIS — M9903 Segmental and somatic dysfunction of lumbar region: Secondary | ICD-10-CM | POA: Diagnosis not present

## 2023-11-23 DIAGNOSIS — M5431 Sciatica, right side: Secondary | ICD-10-CM | POA: Diagnosis not present

## 2023-11-28 ENCOUNTER — Other Ambulatory Visit: Payer: Self-pay | Admitting: General Surgery

## 2023-11-28 DIAGNOSIS — R1011 Right upper quadrant pain: Secondary | ICD-10-CM

## 2023-11-29 DIAGNOSIS — M5431 Sciatica, right side: Secondary | ICD-10-CM | POA: Diagnosis not present

## 2023-11-29 DIAGNOSIS — M9903 Segmental and somatic dysfunction of lumbar region: Secondary | ICD-10-CM | POA: Diagnosis not present

## 2023-12-01 ENCOUNTER — Ambulatory Visit
Admission: RE | Admit: 2023-12-01 | Discharge: 2023-12-01 | Disposition: A | Discharge status: Home / Self Care | Source: Ambulatory Visit | Attending: General Surgery | Admitting: General Surgery

## 2023-12-01 DIAGNOSIS — R1011 Right upper quadrant pain: Secondary | ICD-10-CM | POA: Diagnosis not present

## 2023-12-06 DIAGNOSIS — M5431 Sciatica, right side: Secondary | ICD-10-CM | POA: Diagnosis not present

## 2023-12-06 DIAGNOSIS — M9903 Segmental and somatic dysfunction of lumbar region: Secondary | ICD-10-CM | POA: Diagnosis not present

## 2023-12-13 DIAGNOSIS — M9903 Segmental and somatic dysfunction of lumbar region: Secondary | ICD-10-CM | POA: Diagnosis not present

## 2023-12-13 DIAGNOSIS — M5431 Sciatica, right side: Secondary | ICD-10-CM | POA: Diagnosis not present

## 2023-12-22 DIAGNOSIS — H01005 Unspecified blepharitis left lower eyelid: Secondary | ICD-10-CM | POA: Diagnosis not present

## 2023-12-22 DIAGNOSIS — H01002 Unspecified blepharitis right lower eyelid: Secondary | ICD-10-CM | POA: Diagnosis not present

## 2023-12-22 DIAGNOSIS — H01001 Unspecified blepharitis right upper eyelid: Secondary | ICD-10-CM | POA: Diagnosis not present

## 2023-12-22 DIAGNOSIS — H01004 Unspecified blepharitis left upper eyelid: Secondary | ICD-10-CM | POA: Diagnosis not present

## 2023-12-22 DIAGNOSIS — H16223 Keratoconjunctivitis sicca, not specified as Sjogren's, bilateral: Secondary | ICD-10-CM | POA: Diagnosis not present

## 2023-12-28 DIAGNOSIS — Z860101 Personal history of adenomatous and serrated colon polyps: Secondary | ICD-10-CM | POA: Diagnosis not present

## 2023-12-28 DIAGNOSIS — Z1211 Encounter for screening for malignant neoplasm of colon: Secondary | ICD-10-CM | POA: Diagnosis not present

## 2023-12-28 DIAGNOSIS — Z09 Encounter for follow-up examination after completed treatment for conditions other than malignant neoplasm: Secondary | ICD-10-CM | POA: Diagnosis not present

## 2023-12-28 DIAGNOSIS — K5730 Diverticulosis of large intestine without perforation or abscess without bleeding: Secondary | ICD-10-CM | POA: Diagnosis not present

## 2023-12-30 DIAGNOSIS — R1011 Right upper quadrant pain: Secondary | ICD-10-CM | POA: Diagnosis not present

## 2023-12-30 DIAGNOSIS — K5909 Other constipation: Secondary | ICD-10-CM | POA: Diagnosis not present

## 2023-12-30 DIAGNOSIS — K5730 Diverticulosis of large intestine without perforation or abscess without bleeding: Secondary | ICD-10-CM | POA: Diagnosis not present

## 2024-01-03 DIAGNOSIS — M5431 Sciatica, right side: Secondary | ICD-10-CM | POA: Diagnosis not present

## 2024-01-03 DIAGNOSIS — M9903 Segmental and somatic dysfunction of lumbar region: Secondary | ICD-10-CM | POA: Diagnosis not present

## 2024-01-11 DIAGNOSIS — M5431 Sciatica, right side: Secondary | ICD-10-CM | POA: Diagnosis not present

## 2024-01-11 DIAGNOSIS — M9903 Segmental and somatic dysfunction of lumbar region: Secondary | ICD-10-CM | POA: Diagnosis not present

## 2024-01-16 DIAGNOSIS — M9903 Segmental and somatic dysfunction of lumbar region: Secondary | ICD-10-CM | POA: Diagnosis not present

## 2024-01-16 DIAGNOSIS — M5431 Sciatica, right side: Secondary | ICD-10-CM | POA: Diagnosis not present

## 2024-01-18 DIAGNOSIS — M5431 Sciatica, right side: Secondary | ICD-10-CM | POA: Diagnosis not present

## 2024-01-18 DIAGNOSIS — M9903 Segmental and somatic dysfunction of lumbar region: Secondary | ICD-10-CM | POA: Diagnosis not present

## 2024-01-31 DIAGNOSIS — M5431 Sciatica, right side: Secondary | ICD-10-CM | POA: Diagnosis not present

## 2024-01-31 DIAGNOSIS — M9903 Segmental and somatic dysfunction of lumbar region: Secondary | ICD-10-CM | POA: Diagnosis not present

## 2024-02-03 DIAGNOSIS — Z23 Encounter for immunization: Secondary | ICD-10-CM | POA: Diagnosis not present

## 2024-02-14 DIAGNOSIS — M5431 Sciatica, right side: Secondary | ICD-10-CM | POA: Diagnosis not present

## 2024-02-14 DIAGNOSIS — M9903 Segmental and somatic dysfunction of lumbar region: Secondary | ICD-10-CM | POA: Diagnosis not present

## 2024-02-20 DIAGNOSIS — L821 Other seborrheic keratosis: Secondary | ICD-10-CM | POA: Diagnosis not present

## 2024-02-20 DIAGNOSIS — Z85828 Personal history of other malignant neoplasm of skin: Secondary | ICD-10-CM | POA: Diagnosis not present

## 2024-02-20 DIAGNOSIS — L814 Other melanin hyperpigmentation: Secondary | ICD-10-CM | POA: Diagnosis not present

## 2024-02-20 DIAGNOSIS — D1801 Hemangioma of skin and subcutaneous tissue: Secondary | ICD-10-CM | POA: Diagnosis not present

## 2024-02-20 DIAGNOSIS — L438 Other lichen planus: Secondary | ICD-10-CM | POA: Diagnosis not present

## 2024-02-28 DIAGNOSIS — M5431 Sciatica, right side: Secondary | ICD-10-CM | POA: Diagnosis not present

## 2024-02-28 DIAGNOSIS — M9903 Segmental and somatic dysfunction of lumbar region: Secondary | ICD-10-CM | POA: Diagnosis not present

## 2024-03-13 DIAGNOSIS — M5431 Sciatica, right side: Secondary | ICD-10-CM | POA: Diagnosis not present

## 2024-03-13 DIAGNOSIS — M9903 Segmental and somatic dysfunction of lumbar region: Secondary | ICD-10-CM | POA: Diagnosis not present

## 2024-03-26 DIAGNOSIS — M9903 Segmental and somatic dysfunction of lumbar region: Secondary | ICD-10-CM | POA: Diagnosis not present

## 2024-03-26 DIAGNOSIS — M5431 Sciatica, right side: Secondary | ICD-10-CM | POA: Diagnosis not present

## 2024-04-02 DIAGNOSIS — M5431 Sciatica, right side: Secondary | ICD-10-CM | POA: Diagnosis not present

## 2024-04-02 DIAGNOSIS — M9903 Segmental and somatic dysfunction of lumbar region: Secondary | ICD-10-CM | POA: Diagnosis not present

## 2024-04-24 ENCOUNTER — Other Ambulatory Visit: Payer: Self-pay | Admitting: Nurse Practitioner

## 2024-04-24 DIAGNOSIS — M85852 Other specified disorders of bone density and structure, left thigh: Secondary | ICD-10-CM

## 2024-09-04 ENCOUNTER — Other Ambulatory Visit

## 2024-09-04 ENCOUNTER — Ambulatory Visit: Admitting: Nurse Practitioner
